# Patient Record
Sex: Female | Born: 1985 | Race: Black or African American | Hispanic: No | Smoking: Never smoker
Health system: Southern US, Community
[De-identification: ages and names within clinical notes are randomized; demographics above are authoritative.]

## PROBLEM LIST (undated history)

## (undated) ENCOUNTER — Inpatient Hospital Stay (HOSPITAL_COMMUNITY): Payer: Self-pay

## (undated) DIAGNOSIS — F419 Anxiety disorder, unspecified: Secondary | ICD-10-CM

## (undated) DIAGNOSIS — F32A Depression, unspecified: Secondary | ICD-10-CM

## (undated) DIAGNOSIS — N760 Acute vaginitis: Secondary | ICD-10-CM

## (undated) DIAGNOSIS — R110 Nausea: Secondary | ICD-10-CM

## (undated) DIAGNOSIS — D649 Anemia, unspecified: Secondary | ICD-10-CM

## (undated) DIAGNOSIS — N898 Other specified noninflammatory disorders of vagina: Secondary | ICD-10-CM

## (undated) DIAGNOSIS — F329 Major depressive disorder, single episode, unspecified: Secondary | ICD-10-CM

## (undated) DIAGNOSIS — I499 Cardiac arrhythmia, unspecified: Secondary | ICD-10-CM

## (undated) DIAGNOSIS — H05119 Granuloma of unspecified orbit: Secondary | ICD-10-CM

## (undated) DIAGNOSIS — O00109 Unspecified tubal pregnancy without intrauterine pregnancy: Secondary | ICD-10-CM

## (undated) DIAGNOSIS — F909 Attention-deficit hyperactivity disorder, unspecified type: Secondary | ICD-10-CM

## (undated) DIAGNOSIS — Z98891 History of uterine scar from previous surgery: Secondary | ICD-10-CM

## (undated) DIAGNOSIS — D219 Benign neoplasm of connective and other soft tissue, unspecified: Secondary | ICD-10-CM

## (undated) DIAGNOSIS — Z9289 Personal history of other medical treatment: Secondary | ICD-10-CM

## (undated) DIAGNOSIS — I209 Angina pectoris, unspecified: Secondary | ICD-10-CM

## (undated) HISTORY — PX: BIOPSY EYE MUSCLE: PRO14

## (undated) HISTORY — DX: Other specified noninflammatory disorders of vagina: N89.8

## (undated) HISTORY — DX: Acute vaginitis: N76.0

---

## 2008-05-08 DIAGNOSIS — Z98891 History of uterine scar from previous surgery: Secondary | ICD-10-CM

## 2008-05-08 HISTORY — DX: History of uterine scar from previous surgery: Z98.891

## 2012-05-08 DIAGNOSIS — H05119 Granuloma of unspecified orbit: Secondary | ICD-10-CM

## 2012-05-08 HISTORY — DX: Granuloma of unspecified orbit: H05.119

## 2013-10-17 ENCOUNTER — Emergency Department: Payer: Self-pay | Admitting: Emergency Medicine

## 2013-10-17 LAB — BASIC METABOLIC PANEL
Anion Gap: 6 — ABNORMAL LOW (ref 7–16)
BUN: 8 mg/dL (ref 7–18)
Calcium, Total: 8.2 mg/dL — ABNORMAL LOW (ref 8.5–10.1)
Chloride: 107 mmol/L (ref 98–107)
Co2: 25 mmol/L (ref 21–32)
Creatinine: 0.88 mg/dL (ref 0.60–1.30)
EGFR (African American): >60
EGFR (Non-African Amer.): >60
Glucose: 81 mg/dL (ref 65–99)
Osmolality: 273 (ref 275–301)
Potassium: 3.1 mmol/L — ABNORMAL LOW (ref 3.5–5.1)
Sodium: 138 mmol/L (ref 136–145)

## 2013-10-17 LAB — CBC
HCT: 30.8 % — ABNORMAL LOW (ref 35.0–47.0)
HGB: 9.9 g/dL — ABNORMAL LOW (ref 12.0–16.0)
MCH: 25.7 pg — ABNORMAL LOW (ref 26.0–34.0)
MCHC: 32 g/dL (ref 32.0–36.0)
MCV: 80 fL (ref 80–100)
Platelet: 267 10*3/uL (ref 150–440)
RBC: 3.83 10*6/uL (ref 3.80–5.20)
RDW: 17.4 % — ABNORMAL HIGH (ref 11.5–14.5)
WBC: 4.3 10*3/uL (ref 3.6–11.0)

## 2013-10-17 LAB — TROPONIN I: Troponin-I: <0.02 ng/mL

## 2014-02-25 ENCOUNTER — Encounter (HOSPITAL_COMMUNITY): Payer: Self-pay | Admitting: Emergency Medicine

## 2014-02-25 ENCOUNTER — Emergency Department (HOSPITAL_COMMUNITY): Payer: Medicaid Other

## 2014-02-25 ENCOUNTER — Emergency Department (HOSPITAL_COMMUNITY)
Admission: EM | Admit: 2014-02-25 | Discharge: 2014-02-26 | Disposition: A | Discharge status: Home / Self Care | Payer: Medicaid Other | Attending: Emergency Medicine | Admitting: Emergency Medicine

## 2014-02-25 DIAGNOSIS — Y9389 Activity, other specified: Secondary | ICD-10-CM | POA: Insufficient documentation

## 2014-02-25 DIAGNOSIS — W01198A Fall on same level from slipping, tripping and stumbling with subsequent striking against other object, initial encounter: Secondary | ICD-10-CM | POA: Diagnosis not present

## 2014-02-25 DIAGNOSIS — S0990XA Unspecified injury of head, initial encounter: Secondary | ICD-10-CM | POA: Diagnosis not present

## 2014-02-25 DIAGNOSIS — Y990 Civilian activity done for income or pay: Secondary | ICD-10-CM | POA: Diagnosis not present

## 2014-02-25 DIAGNOSIS — R55 Syncope and collapse: Secondary | ICD-10-CM

## 2014-02-25 DIAGNOSIS — Z3202 Encounter for pregnancy test, result negative: Secondary | ICD-10-CM | POA: Diagnosis not present

## 2014-02-25 DIAGNOSIS — R10814 Left lower quadrant abdominal tenderness: Secondary | ICD-10-CM | POA: Diagnosis not present

## 2014-02-25 DIAGNOSIS — Y92239 Unspecified place in hospital as the place of occurrence of the external cause: Secondary | ICD-10-CM | POA: Insufficient documentation

## 2014-02-25 LAB — BASIC METABOLIC PANEL
Anion gap: 14 (ref 5–15)
BUN: 13 mg/dL (ref 6–23)
CO2: 25 mEq/L (ref 19–32)
Calcium: 8.9 mg/dL (ref 8.4–10.5)
Chloride: 101 mEq/L (ref 96–112)
Creatinine, Ser: 0.97 mg/dL (ref 0.50–1.10)
GFR calc Af Amer: >90 mL/min (ref >90)
GFR calc non Af Amer: 79 mL/min — ABNORMAL LOW (ref >90)
Glucose, Bld: 118 mg/dL — ABNORMAL HIGH (ref 70–99)
Potassium: 3.6 mEq/L — ABNORMAL LOW (ref 3.7–5.3)
Sodium: 140 mEq/L (ref 137–147)

## 2014-02-25 LAB — CBC WITH DIFFERENTIAL/PLATELET
Basophils Absolute: 0 10*3/uL (ref 0.0–0.1)
Basophils Relative: 0 % (ref 0–1)
Eosinophils Absolute: 0.3 10*3/uL (ref 0.0–0.7)
Eosinophils Relative: 4 % (ref 0–5)
HCT: 32.4 % — ABNORMAL LOW (ref 36.0–46.0)
Hemoglobin: 10.4 g/dL — ABNORMAL LOW (ref 12.0–15.0)
Lymphocytes Relative: 62 % — ABNORMAL HIGH (ref 12–46)
Lymphs Abs: 4.6 10*3/uL — ABNORMAL HIGH (ref 0.7–4.0)
MCH: 26.4 pg (ref 26.0–34.0)
MCHC: 32.1 g/dL (ref 30.0–36.0)
MCV: 82.2 fL (ref 78.0–100.0)
Monocytes Absolute: 0.6 10*3/uL (ref 0.1–1.0)
Monocytes Relative: 8 % (ref 3–12)
Neutro Abs: 1.9 10*3/uL (ref 1.7–7.7)
Neutrophils Relative %: 26 % — ABNORMAL LOW (ref 43–77)
Platelets: 327 10*3/uL (ref 150–400)
RBC: 3.94 MIL/uL (ref 3.87–5.11)
RDW: 14.9 % (ref 11.5–15.5)
WBC: 7.5 10*3/uL (ref 4.0–10.5)

## 2014-02-25 LAB — URINE MICROSCOPIC-ADD ON

## 2014-02-25 LAB — URINALYSIS, ROUTINE W REFLEX MICROSCOPIC
Bilirubin Urine: NEGATIVE
Glucose, UA: NEGATIVE mg/dL
Ketones, ur: 15 mg/dL — AB
Nitrite: NEGATIVE
Protein, ur: 100 mg/dL — AB
Specific Gravity, Urine: 1.023 (ref 1.005–1.030)
Urobilinogen, UA: 0.2 mg/dL (ref 0.0–1.0)
pH: 5.5 (ref 5.0–8.0)

## 2014-02-25 LAB — PREGNANCY, URINE: Preg Test, Ur: NEGATIVE

## 2014-02-25 MED ORDER — ACETAMINOPHEN 500 MG PO TABS
1000.0000 mg | ORAL_TABLET | Freq: Once | ORAL | Status: AC
  Administered 2014-02-25: 1000 mg via ORAL
  Filled 2014-02-25: qty 2

## 2014-02-25 MED ORDER — SODIUM CHLORIDE 0.9 % IV BOLUS (SEPSIS)
1000.0000 mL | Freq: Once | INTRAVENOUS | Status: AC
  Administered 2014-02-25: 1000 mL via INTRAVENOUS

## 2014-02-25 NOTE — ED Provider Notes (Signed)
CSN: [id number]     Arrival date & time 02/25/14  1934 History   First MD Initiated Contact with Patient 02/25/14 2004     Chief Complaint  Patient presents with  . Loss of Consciousness     (Consider location/radiation/quality/duration/timing/severity/associated sxs/prior Treatment) HPI Comments: Patient is a 28 year old female with no past medical history who presents after a syncopal episode that occurred prior to arrival. 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 She is unsure how long she was unconscious for. Patient reports hitting the back over her head when she fell. She reports headache since the fall that is throbbing and severe. Patient also describes left lower abdominal pain that started this evening which she thought may have been from her lunch. The pain is aching and moderate with radiation to her left groin. Patient reports a history of ectopic pregnancy which "feels the same as this." Patient is currently menstruating. No aggravating/alleviating factors. No other associated symptoms.    History reviewed. No pertinent past medical history. History reviewed. No pertinent past surgical history. No family history on file. History  Substance Use Topics  . Smoking status: Never Smoker   . Smokeless tobacco: Not on file  . Alcohol Use: Not on file   OB History   Grav Para Term Preterm Abortions TAB SAB Ect Mult Living                 Review of Systems  Constitutional: Negative for fever, chills and fatigue.  HENT: Negative for trouble swallowing.   Eyes: Negative for visual disturbance.  Respiratory: Negative for shortness of breath.   Cardiovascular: Negative for chest pain and palpitations.  Gastrointestinal: Positive for abdominal pain. Negative for nausea, vomiting and diarrhea.  Genitourinary: Negative for  dysuria and difficulty urinating.  Musculoskeletal: Negative for arthralgias and neck pain.  Skin: Negative for color change.  Neurological: Positive for syncope and headaches. Negative for dizziness and weakness.  Psychiatric/Behavioral: Negative for dysphoric mood.      Allergies  Review of patient's allergies indicates no known allergies.  Home Medications   Prior to Admission medications   Not on File   BP 102/57  Pulse 67  Temp(Src) 99.1 F (37.3 C) (Oral)  Resp 12  Ht 5\' 2"  (1.575 m)  Wt 144 lb (65.318 kg)  BMI 26.33 kg/m2  SpO2 95%  LMP 02/22/2014 Physical Exam  Nursing note and vitals reviewed. Constitutional: She is oriented to person, place, and time. She appears well-developed and well-nourished. No distress.  HENT:  Head: Normocephalic and atraumatic.  Eyes: Conjunctivae and EOM are normal.  Neck: Normal range of motion.  Cardiovascular: Normal rate and regular rhythm.  Exam reveals no gallop and no friction rub.   No murmur heard. Pulmonary/Chest: Effort normal and breath sounds normal. She has no wheezes. She has no rales. She exhibits no tenderness.  Abdominal: Soft. She exhibits no distension. There is tenderness. There is no rebound and no guarding.  Left lower abdominal tenderness to palpation. No other focal tenderness or peritoneal signs.   Musculoskeletal: Normal range of motion.  Neurological: She is alert and oriented to person, place, and time. Coordination normal.  Speech is goal-oriented. Moves limbs without ataxia.   Skin: Skin is warm and dry.  Psychiatric: She has a normal mood and affect. Her behavior is normal.  ED Course  Procedures (including critical care time) Labs Review Labs Reviewed  CBC WITH DIFFERENTIAL - Abnormal; Notable for the following:    Hemoglobin 10.4 (*)    HCT 32.4 (*)    Neutrophils Relative % 26 (*)    Lymphocytes Relative 62 (*)    Lymphs Abs 4.6 (*)    All other components within normal limits  BASIC  METABOLIC PANEL - Abnormal; Notable for the following:    Potassium 3.6 (*)    Glucose, Bld 118 (*)    GFR calc non Af Amer 79 (*)    All other components within normal limits  URINALYSIS, ROUTINE W REFLEX MICROSCOPIC - Abnormal; Notable for the following:    Color, Urine RED (*)    APPearance CLOUDY (*)    Hgb urine dipstick LARGE (*)    Ketones, ur 15 (*)    Protein, ur 100 (*)    Leukocytes, UA SMALL (*)    All other components within normal limits  URINE MICROSCOPIC-ADD ON - Abnormal; Notable for the following:    Squamous Epithelial / LPF MANY (*)    Bacteria, UA FEW (*)    Casts HYALINE CASTS (*)    All other components within normal limits  PREGNANCY, URINE    Imaging Review Dg Chest 2 View  02/25/2014   CLINICAL DATA:  Collie Siad a area were patient's week to see.  EXAM: CHEST  2 VIEW  COMPARISON:  10/17/2013  FINDINGS: The heart size and mediastinal contours are within normal limits. Both lungs are clear. The visualized skeletal structures are unremarkable.  IMPRESSION: No active cardiopulmonary disease.   Electronically Signed   By: Lucienne Capers M.D.   On: 02/25/2014 21:02   Ct Head Wo Contrast  02/25/2014   CLINICAL DATA:  28 year old female with acute onset of dizziness, blurred vision and headache, follow by syncopal event. Trauma from a fall during a syncopal event with injury to the back of the head.  EXAM: CT HEAD WITHOUT CONTRAST  TECHNIQUE: Contiguous axial images were obtained from the base of the skull through the vertex without intravenous contrast.  COMPARISON:  No priors.  FINDINGS: No acute displaced skull fractures are identified. No acute intracranial abnormality. Specifically, no evidence of acute post-traumatic intracranial hemorrhage, no definite regions of acute/subacute cerebral ischemia, no focal mass, mass effect, hydrocephalus or abnormal intra or extra-axial fluid collections. The visualized paranasal sinuses and mastoids are generally well pneumatized,  with exception of a small amount of mucosal thickening in the ethmoid sinuses.  IMPRESSION: 1. No acute displaced skull fractures or acute intracranial abnormalities. 2. The appearance of the brain is normal.   Electronically Signed   By: Vinnie Langton M.D.   On: 02/25/2014 21:18     EKG Interpretation   Date/Time:  Wednesday February 25 2014 19:39:49 EDT Ventricular Rate:  69 PR Interval:  125 QRS Duration: 72 QT Interval:  369 QTC Calculation: 395 R Axis:   68 Text Interpretation:  Sinus rhythm Normal ECG No previous tracing  Confirmed by Maryan Rued  MD, Loree Fee ([id number]) on 02/25/2014 7:54:23 PM      MDM   Final diagnoses:  Syncope  Syncope and collapse    8:17 PM Labs, CT head and chest xray pending. Vitals stable and patient afebrile.   11:10 PM Patient's orthostatic vital signs indicate hypovolemia. Patient reports improved symptoms after IV fluids. Vitals stable and patient afebrile. Chest xray, CT head and labs unremarkable. Urine pregnancy negative. Patient has hemoglobin in urine  due to menstruation. Patient will be discharged after second liter of fluids.    Alvina Chou, PA-C 02/26/14 0000

## 2014-02-25 NOTE — ED Notes (Signed)
PT on monitor and given bedside commode per RN. PT informed to use call bell if assistance is needed and to not walk around the room.

## 2014-02-25 NOTE — ED Notes (Signed)
Patient orthostatic lying was  99/54 PA notified and gave verbal for bolus recheck orthostatic after bolus

## 2014-02-25 NOTE — ED Notes (Signed)
Patient transported to X-ray 

## 2014-02-25 NOTE — ED Notes (Signed)
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 Patient states she went to the rest room to have a BM and walk out the restroom went to the desk. When she stood up from the desk she fell backwards and LOC. Patient states current left side groin area and back of the head is hurting 8/10.

## 2014-02-26 NOTE — Discharge Instructions (Signed)
Drink plenty of fluids to stay hydrated. Refer to attached documents for more information. Return to the ED with worsening or concerning symptoms.

## 2014-02-26 NOTE — ED Provider Notes (Signed)
Medical screening examination/treatment/procedure(s) were performed by non-physician practitioner and as supervising physician I was immediately available for consultation/collaboration.   EKG Interpretation   Date/Time:  Wednesday February 25 2014 19:39:49 EDT Ventricular Rate:  69 PR Interval:  125 QRS Duration: 72 QT Interval:  369 QTC Calculation: 395 R Axis:   68 Text Interpretation:  Sinus rhythm Normal ECG No previous tracing  Confirmed by Maryan Rued  MD, Courtland Coppa ([id number]) on 02/25/2014 7:54:23 PM        Blanchie Dessert, MD 02/26/14 [id number]

## 2014-03-24 ENCOUNTER — Emergency Department: Payer: Self-pay | Admitting: Emergency Medicine

## 2014-04-03 ENCOUNTER — Ambulatory Visit: Payer: Self-pay | Admitting: Ophthalmology

## 2014-04-22 ENCOUNTER — Encounter (HOSPITAL_COMMUNITY): Payer: Self-pay | Admitting: Family Medicine

## 2014-04-22 ENCOUNTER — Emergency Department (HOSPITAL_COMMUNITY)
Admission: EM | Admit: 2014-04-22 | Discharge: 2014-04-22 | Disposition: A | Discharge status: Home / Self Care | Payer: Medicaid Other | Attending: Emergency Medicine | Admitting: Emergency Medicine

## 2014-04-22 DIAGNOSIS — H6983 Other specified disorders of Eustachian tube, bilateral: Secondary | ICD-10-CM

## 2014-04-22 DIAGNOSIS — H9203 Otalgia, bilateral: Secondary | ICD-10-CM | POA: Diagnosis present

## 2014-04-22 DIAGNOSIS — H6993 Unspecified Eustachian tube disorder, bilateral: Secondary | ICD-10-CM | POA: Insufficient documentation

## 2014-04-22 MED ORDER — FLUTICASONE PROPIONATE 50 MCG/ACT NA SUSP: 2.0000 | Freq: Every day | NASAL | Status: DC

## 2014-04-22 MED ORDER — IBUPROFEN 400 MG PO TABS
800.0000 mg | ORAL_TABLET | Freq: Once | ORAL | Status: AC
  Administered 2014-04-22: 800 mg via ORAL
  Filled 2014-04-22: qty 2

## 2014-04-22 NOTE — ED Provider Notes (Signed)
CSN: [id number]     Arrival date & time 04/22/14  1819 History  This chart was scribed for non-physician practitioner, Monico Blitz, PA-C, working with Quintella Reichert, MD, by Delphia Grates, ED Scribe. This patient was seen in room TR06C/TR06C and the patient's care was started at 7:24 PM.   Chief Complaint  Patient presents with  . Otalgia    The history is provided by the patient. No language interpreter was used.   HPI Comments: Caitlyn Roy is a 28 y.o. female, with no significant medical history, who presents to the Emergency Department complaining of sudden right-sided otalgia which she describes as pressure onset this morning she also has left-sided otalgia onset this afternoon. Denies decrease in hearing acuity, tinnitus. Patient suspects she has an ear infection, and reports she has recently gotten over a cold. She notes productive cough of "thick" sputum. Patient states she has taken Tylenol without significant relief. Patient is not established with a PCP. Reports subjective fever. Denies chills, chest pain, shortness of breath, nausea, vomiting, headache, sinus pain, cervicalgia, rash, change in bowel or bladder habits.   History reviewed. No pertinent past medical history. History reviewed. No pertinent past surgical history. History reviewed. No pertinent family history. History  Substance Use Topics  . Smoking status: Never Smoker   . Smokeless tobacco: Not on file  . Alcohol Use: Not on file   OB History    No data available     Review of Systems  A complete 10 system review of systems was obtained and all systems are negative except as noted in the HPI and PMH.    Allergies  Review of patient's allergies indicates no known allergies.  Home Medications   Prior to Admission medications   Not on File   Triage Vitals: BP 113/69 mmHg  Pulse 90  Temp(Src) 98.3 F (36.8 C) (Oral)  Resp 18  SpO2 100%  LMP 04/08/2014  Physical Exam  Constitutional:  She is oriented to person, place, and time. She appears well-developed and well-nourished. No distress.  HENT:  Head: Normocephalic and atraumatic.  Right Ear: External ear normal.  Left Ear: External ear normal.  Mouth/Throat: Oropharynx is clear and moist.  Trace erythema to bilateral tympanic membranes with normal architecture and good light reflex.  Eyes: Conjunctivae and EOM are normal. Pupils are equal, round, and reactive to light.  Neck: Normal range of motion. Neck supple. No tracheal deviation present.  Cardiovascular: Normal rate and intact distal pulses.   Pulmonary/Chest: Effort normal and breath sounds normal. No respiratory distress. She has no wheezes. She has no rales. She exhibits no tenderness.  Abdominal: Soft. Bowel sounds are normal. She exhibits no distension and no mass. There is no tenderness. There is no rebound and no guarding.  Musculoskeletal: Normal range of motion.  Neurological: She is alert and oriented to person, place, and time.  Skin: Skin is warm and dry.  Psychiatric: She has a normal mood and affect. Her behavior is normal.  Nursing note and vitals reviewed.   ED Course  Procedures (including critical care time)  DIAGNOSTIC STUDIES: Oxygen Saturation is 100% on room air, normal by my interpretation.    COORDINATION OF CARE: At 1927 Discussed treatment plan with patient which includes Motrin for pain control. Patient is not in agreement stating that she knows she has an ear infection and demands ABX. Informed patient that I will prescribe Flonase, however, she continues to demand ABX, and is requesting to speak with a supervisor.  At [place] spoke with patient. Patient works as Designer, multimedia at [employer], and does not want to this affect her job. Patient is requesting to a second opinion  At [place] After second opinion, patient is now in agreement with the original treatment plan of Flonase.   Labs Review Labs Reviewed - No data to  display  Imaging Review No results found.   EKG Interpretation None      MDM   Final diagnoses:  Eustachian tube dysfunction, bilateral    Filed Vitals:   04/22/14 1832 04/22/14 1957  BP: 113/69 120/76  Pulse: 90 60  Temp: 98.3 F (36.8 C)   TempSrc: Oral   Resp: 18 16  SpO2: 100% 100%    Medications  ibuprofen (ADVIL,MOTRIN) tablet 800 mg (800 mg Oral Given 04/22/14 1953)    Caitlyn Roy is a pleasant 28 y.o. female presenting with a lateral ear pain onset this morning. No signs of otitis media. It think this is likely related to eustachian tube dysfunction. I have explained to the patient that antibiotics are not indicated in this situation. I've advised the patient that using over-the-counter Flonase in addition to nasal saline will help to alleviate the pressure difference in discomfort. Symptoms consistent upon antibiotics and believes that she has an ear infection. I have advised her that antibiotics when used inappropriately will be less affected when they are needed.  Evaluation does not show pathology that would require ongoing emergent intervention or inpatient treatment. Pt is hemodynamically stable and mentating appropriately. Discussed findings and plan with patient/guardian, who agrees with care plan. All questions answered. Return precautions discussed and outpatient follow up given.        Monico Blitz, PA-C 04/23/14 [id number]  Quintella Reichert, MD 04/23/14 [phone]

## 2014-04-22 NOTE — ED Notes (Signed)
Pt sts right ear pain and now the left is hurting. sts slight fever.

## 2014-04-22 NOTE — ED Notes (Signed)
[place], PA at bedside.  

## 2014-04-22 NOTE — Discharge Instructions (Signed)
Flonase is available over-the-counter. You can pick this up at any pharmacy.  Use nasal saline (you can try Arm and Hammer Simply Saline) at least 4 times a day, use saline 5-10 minutes before using the fluticasone (flonase)   Do not use Afrin (Oxymetazoline)  Rest, wash hands frequently  and drink plenty of water.  Do not hesitate to return to the emergency room for any new, worsening or concerning symptoms.  Please obtain primary care using resource guide below. But the minute you were seen in the emergency room and that they will need to obtain records for further outpatient management.    Emergency Department Resource Guide 1) Find a Doctor and Pay Out of Pocket Although you won't have to find out who is covered by your insurance plan, it is a good idea to ask around and get recommendations. You will then need to call the office and see if the doctor you have chosen will accept you as a new patient and what types of options they offer for patients who are self-pay. Some doctors offer discounts or will set up payment plans for their patients who do not have insurance, but you will need to ask so you aren't surprised when you get to your appointment.  2) Contact Your Local Health Department Not all health departments have doctors that can see patients for sick visits, but many do, so it is worth a call to see if yours does. If you don't know where your local health department is, you can check in your phone book. The CDC also has a tool to help you locate your state's health department, and many state websites also have listings of all of their local health departments.  3) Find a [facility] If your illness is not likely to be very severe or complicated, you may want to try a walk in clinic. These are popping up all over the country in pharmacies, drugstores, and shopping centers. They're usually staffed by nurse practitioners or physician assistants that have been trained to treat common  illnesses and complaints. They're usually fairly quick and inexpensive. However, if you have serious medical issues or chronic medical problems, these are probably not your best option.  No Primary Care Doctor: - Call Health Connect at  [phone] - they can help you locate a primary care doctor that  accepts your insurance, provides certain services, etc. - Physician Referral Service- [phone]  Chronic Pain Problems: Organization         Address  Phone   Notes  [facility]  [phone] Patients need to be referred by their primary care doctor.   Medication Assistance: Organization         Address  Phone   Notes  [facility] Medication [facility] [place]., [place], [place] [address] [phone] --Must be a resident of [facility] -- Must have NO insurance coverage whatsoever (no Medicaid/ Medicare, etc.) -- The pt. MUST have a primary care doctor that directs their care regularly and follows them in the community   [facility]ist  [phone]   [employer]  [phone]    Agencies that provide inexpensive medical care: Organization         Address  Phone   Notes  [place]  [phone]   [facility]    [phone]   [facility] [place], [place] [address] [phone]  [facility] [address], [place] [phone]   Planned Parenthood    [phone]   [facility]    [phone]   [place] and [address], [place] Phone:  [phone], Fax:  [phone] Hours of Operation:  9 am - 6 pm, M-F.  Also accepts Medicaid/Medicare and self-pay.  [facility] for [place] [address], [address], [place] Phone: [phone], Fax: [phone]. Hours of Operation:  8:30 am - 5:30 pm, M-F.  Also accepts Medicaid and self-pay.  [facility]  [address], [place] Phone: [phone]   [place], [place], [place] [phone], Ext. [phone] Mondays & Thursdays: 7-9 AM.  First 15 patients are seen on a first come, first serve basis.    [place] Providers:  Organization         Address  Phone   Notes  [facility] [address], [place] [phone] Also accepts self-pay patients.  [facility] [address], [place]  [phone]   [place], [address], [place] [phone]   [facility] [address], [place] [phone]   Lucianne Lei [address], [place]   [phone] Only accepts [place] Access [place] patients after they have their name applied to their card.   Self-Pay (no insurance) in [facility]:  Organization         Address  Phone   Notes  Sickle Cell Patients, [facility] [place] [phone]   [facility] [place] [phone]   [facility] [place]  [place], [place], [place] [phone]   [facility]/Dr. Osei-Bonsu  [address], [place] or [address], [place] [phone] Phone number for both [place] and [place] locations is the same.  [facility] [address] [employer] [phone]   [facility] [address] [place] or [address] [phone]   [facility] [address], [place] [phone], phone; [phone], fax Sees patients 1st and 3rd Saturday of every month.  Must not qualify for public or private insurance (i.e. Medicaid, Medicare, [place] Health Choice, Veterans' Benefits)  Household income should be no more than 200% of the  poverty level The clinic cannot treat you if you are pregnant or think you are pregnant  Sexually transmitted diseases are not treated at the clinic.    Dental Care: Organization         Address  Phone  Notes  [facility] Department of [facility] [phone] Accepts children up to age 19 who are enrolled in [place] or [place]; pregnant women with a Medicaid card; and children who have applied for Medicaid or [place] Health Choice, but were declined, whose parents can pay a reduced fee at time of service.  [facility] Department of [facility]  [address], [place] [phone] Accepts children up to age 56 who are enrolled in [place] or [place]; pregnant women with a Medicaid card; and  children who have applied for Medicaid or [place] Health Choice, but were declined, whose parents can pay a reduced fee at time of service.  [place] Adult Dental Access PROGRAM  [place] [phone] Patients are seen by appointment only. Walk-ins are not accepted. [place] will see patients 5 years of age and older. Monday - Tuesday (8am-5pm) Most Wednesdays (8:30-5pm) $30 per visit, cash only  [facility] Adult Dental Access PROGRAM  [address], [facility] [phone] Patients are seen by appointment only. Walk-ins are not accepted. [place] will see patients 50 years of age and older. One Wednesday Evening (Monthly: Volunteer Based).  $30 per visit, cash only  [place]  [phone] for adults; Children under age 65, call Graduate Pediatric Dentistry at [phone]. Children aged 104-14, please call [phone] to request a pediatric application.  Dental services are provided in all areas of dental care including fillings, crowns and bridges, complete and partial dentures, implants, gum treatment, root canals, and extractions.  Preventive care is also provided. Treatment is provided to both adults and children. Patients are selected via a lottery and there is often a waiting list.   [facility] [address] [place]  [phone] [email or web address]   [facility] [address] [place], [place] [phone], Ext. [phone] Second and Fourth Thursday of each month, opens at 6:30 AM; Clinic ends at 9 AM.  Patients are seen on a first-come first-served basis, and a limited number are seen during each clinic.   [facility]  [address] [place], [place] [phone]   Eligibility Requirements You must have lived in [place], [place], or [place] counties for at least the last three months.   You cannot be eligible for state or federal sponsored [employer], including [employer], [place], or [employer].   You generally cannot be eligible for healthcare insurance through your employer.    How to apply: Eligibility screenings are held every Tuesday and Wednesday afternoon from 1:00 pm until 4:00 pm. You do not need an appointment for the interview!  [facility] [address], [place], [place]   [place]  [place] Department  [place]  [phone]    Behavioral Health Resources in the Community: Intensive Outpatient Programs Organization         Address  Phone  Notes  [place]a [place]. [address], [place], [place] [phone]   [facility] [address], [place], [place]   [facility]: [facility] [address], [place], [place]   [place] [address],  [place], [place] or [phone]   Substance Abuse Resources Organization         Address  Phone  Notes  Alcohol and Drug Services  [phone]   [place]  [phone]   [facility] [place]   Chinita Pester  [phone]   Residential & Outpatient Substance Abuse Program  [phone]   Psychological Services Organization         Address  Phone  Notes  [facility] [place]  [place]  [phone]   [place] [address], [place] or [phone]    Mobile Crisis Teams Organization  Address  Phone  Notes  [facility], Mobile Crisis Care Unit  [phone]   [facility]  [address] [place], [place]   [facility] [address] [place] [phone]    Self-Help/Support Groups Organization         Address  Phone             Notes  [place]. of [place] - variety of support groups  [place] Call for more information  Narcotics Anonymous (NA), [facility] [address], [employer] [place]  2 meetings at this location   Special educational needs teacher         Address  Phone  Notes  [facility]AP Residential Treatment [place],    [place]  [phone]   [facility]  [address] [place] [address], [place], [place]   [facility] [place], [place] [phone] Admissions: 8am-3pm M-F  Incentives Substance [place] [address]    [place], [place] [phone]   [facility] [address] [place], [place], [place]   [facility] [address]  [place], [place]   [facility]ight Programs - Intensive Outpatient [address] Kristeen [address], [place], [place]   [facility] [place].,  [place], [place] [phone] or [phone]   [facility] Services (RTS) [address] [place], [place] Accepts Medicaid  [facility] [place] [address]  [place]  [phone] Substance Abuse/Addiction Treatment   [facility] Organization         Address  Phone  Notes  [facility]  [phone]   Domenic Schwab, PhD [address] [place], [place]   [phone] or [phone]   [place], [place] [phone]   [facility] [address] [place], [place] [phone] Insurance/Medicaid/sponsorship through [facility] and Families [address]                                    [place], [place] [phone] [place] [address], [place] [phone]    Dr. Adele Schilder  [phone]   [facility] [place] Dept. 1) [address], [place] 2) [place] 3)  [place] [address], [place] [phone]  [phone]   [place] [phone] or [phone] (After Hours)

## 2014-08-31 ENCOUNTER — Encounter (HOSPITAL_COMMUNITY): Payer: Self-pay | Admitting: *Deleted

## 2014-08-31 ENCOUNTER — Emergency Department (HOSPITAL_COMMUNITY)
Admission: EM | Admit: 2014-08-31 | Discharge: 2014-08-31 | Disposition: A | Discharge status: Home / Self Care | Payer: Medicaid Other | Attending: Emergency Medicine | Admitting: Emergency Medicine

## 2014-08-31 DIAGNOSIS — R200 Anesthesia of skin: Secondary | ICD-10-CM | POA: Insufficient documentation

## 2014-08-31 DIAGNOSIS — R519 Headache, unspecified: Secondary | ICD-10-CM

## 2014-08-31 DIAGNOSIS — R0981 Nasal congestion: Secondary | ICD-10-CM | POA: Insufficient documentation

## 2014-08-31 DIAGNOSIS — R51 Headache: Secondary | ICD-10-CM | POA: Insufficient documentation

## 2014-08-31 DIAGNOSIS — Z85528 Personal history of other malignant neoplasm of kidney: Secondary | ICD-10-CM | POA: Diagnosis not present

## 2014-08-31 HISTORY — DX: Unspecified tubal pregnancy without intrauterine pregnancy: O00.109

## 2014-08-31 HISTORY — DX: Granuloma of unspecified orbit: H05.119

## 2014-08-31 LAB — I-STAT BETA HCG BLOOD, ED (MC, WL, AP ONLY): I-stat hCG, quantitative: <5 m[IU]/mL (ref <5)

## 2014-08-31 MED ORDER — KETOROLAC TROMETHAMINE 30 MG/ML IJ SOLN
30.0000 mg | Freq: Once | INTRAMUSCULAR | Status: AC
  Administered 2014-08-31: 30 mg via INTRAVENOUS
  Filled 2014-08-31: qty 1

## 2014-08-31 MED ORDER — SODIUM CHLORIDE 0.9 % IV BOLUS (SEPSIS)
1000.0000 mL | Freq: Once | INTRAVENOUS | Status: AC
  Administered 2014-08-31: 1000 mL via INTRAVENOUS

## 2014-08-31 MED ORDER — METOCLOPRAMIDE HCL 5 MG/ML IJ SOLN
10.0000 mg | Freq: Once | INTRAMUSCULAR | Status: AC
  Administered 2014-08-31: 10 mg via INTRAVENOUS
  Filled 2014-08-31: qty 2

## 2014-08-31 NOTE — ED Provider Notes (Signed)
CSN: [id number]     Arrival date & time 08/31/14  5885 History   First MD Initiated Contact with Patient 08/31/14 [phone]     Chief Complaint  Patient presents with  . Tingling  . Headache     Patient is a 29 y.o. female presenting with headaches. The history is provided by the patient.  Headache Location: right orbital. Quality:  Sharp Radiates to:  Does not radiate Onset quality:  Gradual Duration:  2 hours Timing:  Constant Progression:  Unchanged Chronicity:  New Relieved by:  Nothing Worsened by:  Nothing Associated symptoms: congestion, facial pain and numbness   Associated symptoms: no abdominal pain, no back pain, no blurred vision, no dizziness, no fever, no focal weakness, no hearing loss, no nausea, no neck pain, no neck stiffness, no paresthesias, no seizures, no syncope, no visual change, no vomiting and no weakness   Risk factors comment:  Denies family h/o premature CVA denies head trauma Denies tick bites/rash  She had episode of right orbital HA yesterday for 30 minutes that resolved This episode started 2 hrs ago It is right sided.  She also had pain in right upper maxillary teeth and nose.  She has mild numbness to right cheek and also feeling that her "nose is stopped up" No other weakness/numbness No h/o CVA   Past Medical History  Diagnosis Date  . Orbital pseudotumor     bil  . Tubal pregnancy    Past Surgical History  Procedure Laterality Date  . Biopsy eye muscle     No family history on file. History  Substance Use Topics  . Smoking status: Never Smoker   . Smokeless tobacco: Not on file  . Alcohol Use: Yes     Comment: occ   OB History    No data available     Review of Systems  Constitutional: Negative for fever.  HENT: Positive for congestion. Negative for hearing loss.   Eyes: Negative for blurred vision.  Respiratory: Negative for shortness of breath.   Cardiovascular: Negative for chest pain and syncope.  Gastrointestinal:  Negative for nausea, vomiting and abdominal pain.  Genitourinary: Negative for vaginal bleeding.  Musculoskeletal: Negative for back pain, neck pain and neck stiffness.  Neurological: Positive for numbness and headaches. Negative for dizziness, focal weakness, seizures, syncope, weakness and paresthesias.  All other systems reviewed and are negative.     Allergies  Review of patient's allergies indicates no known allergies.  Home Medications   Prior to Admission medications   Not on File   BP 141/91 mmHg  Pulse 73  Temp(Src) 98.4 F (36.9 C) (Oral)  Resp 13  SpO2 100%  LMP 08/18/2014 Physical Exam CONSTITUTIONAL: Well developed/well nourished HEAD: Normocephalic/atraumatic EYES: EOMI/PERRL, no nystagmus, no ptosis ENMT: Mucous membranes moist, tenderness to right upper gingiva but no dental tenderness/abscess NECK: supple no meningeal signs, no bruits SPINE/BACK:entire spine nontender CV: S1/S2 noted, no murmurs/rubs/gallops noted LUNGS: Lungs are clear to auscultation bilaterally, no apparent distress ABDOMEN: soft, nontender, no rebound or guarding GU:no cva tenderness NEURO:Awake/alert, facies symmetric, no arm or leg drift is noted Equal 5/5 strength with shoulder abduction, elbow flex/extension, wrist flex/extension in upper extremities and equal hand grips bilaterally Equal 5/5 strength with hip flexion,knee flex/extension, foot dorsi/plantar flexion Other than reported numbness to right cheek, Cranial nerves 3/4/5/6/11/13/08/11/12 tested and intact Gait normal without ataxia No past pointing Sensation to light touch intact in all extremities EXTREMITIES: pulses normal, full ROM SKIN: warm, color normal PSYCH: no  abnormalities of mood noted, alert and oriented to situation   ED Course  Procedures  9:23 AM I have low suspicion for acute CVA/SAH at this time 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   Will treat HA and reassess 10:45 AM Pt improved Well  appearing She would like to go home We discussed strict return precautions .she reports now her pain yesterday and today started after sneezing/blowing her nose   Labs Review Labs Reviewed  I-STAT BETA HCG BLOOD, ED ([facility], [facility], [facility] ONLY)   Medications  metoCLOPramide (REGLAN) injection 10 mg (10 mg Intravenous Given 08/31/14 1021)  ketorolac (TORADOL) 30 MG/ML injection 30 mg (30 mg Intravenous Given 08/31/14 1020)  sodium chloride 0.9 % bolus 1,000 mL (1,000 mLs Intravenous New Bag/Given 08/31/14 0933)    MDM   Final diagnoses:  Acute nonintractable headache, unspecified headache type    Nursing notes including past medical history and social history reviewed and considered in documentation Labs/vital reviewed myself and considered during evaluation Previous records reviewed and considered - negative CT head in 02/2014     Ripley Fraise, MD 08/31/14 1046

## 2014-08-31 NOTE — ED Notes (Signed)
Pt states numbness to R lip and R cheek and headache behind R eye .  Symptoms began at 7:23 this am and are still present.  States the same yesterday for 30 min.  No slurred speech.  Neuro intact.  PT is congested.

## 2014-08-31 NOTE — Discharge Instructions (Signed)

## 2014-09-01 ENCOUNTER — Encounter (HOSPITAL_COMMUNITY): Payer: Self-pay | Admitting: *Deleted

## 2014-09-01 ENCOUNTER — Emergency Department (HOSPITAL_COMMUNITY): Payer: Medicaid Other

## 2014-09-01 ENCOUNTER — Emergency Department (HOSPITAL_COMMUNITY)
Admission: EM | Admit: 2014-09-01 | Discharge: 2014-09-01 | Disposition: A | Discharge status: Home / Self Care | Payer: Medicaid Other | Attending: Emergency Medicine | Admitting: Emergency Medicine

## 2014-09-01 DIAGNOSIS — R200 Anesthesia of skin: Secondary | ICD-10-CM

## 2014-09-01 DIAGNOSIS — Z86018 Personal history of other benign neoplasm: Secondary | ICD-10-CM | POA: Insufficient documentation

## 2014-09-01 DIAGNOSIS — R202 Paresthesia of skin: Secondary | ICD-10-CM | POA: Insufficient documentation

## 2014-09-01 NOTE — Discharge Instructions (Signed)
Please follow directions provided. Be sure to follow-up with the neurologist if your symptoms do not improve. Please use the referral provided to establish care with a primary care doctor. She drink plenty fluids to stay well hydrated. Get adequate rest and eat a balanced diet. Don't hesitate to return for any new, worsening, or concerning symptoms.   SEEK IMMEDIATE MEDICAL CARE IF:  You feel weak.  You have trouble walking or moving.  You have problems with speech or vision.  You feel confused.  You cannot control your bladder or bowel movements.  You feel numbness after an injury.  You faint.  Your burning or prickling feeling gets worse when walking.  You have pain, cramps, or dizziness.  You develop a rash.

## 2014-09-01 NOTE — ED Provider Notes (Signed)
CSN: [id number]     Arrival date & time 09/01/14  0900 History   First MD Initiated Contact with Patient 09/01/14 [phone]     Chief Complaint  Patient presents with  . Numbness    (Consider location/radiation/quality/duration/timing/severity/associated sxs/prior Treatment) HPI Caitlyn Roy is a 29 yo female presenting with report of numbness noted in her right hand.  She states she was seen yesterday for a headache and treated in the ED and symptoms resolved.  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  After driving home, she pulled into her driveway and noticed her right hand felt weak and tingling.  She became concerned she was having a stroke and came back to the ED for evaluation.  She reports her symptoms began appr 30 min prior to arrival.  She denies any visual changes, current headache, fevers, chills, neck pain or stiffness, nausea, vomiting or difficul  Past Medical History  Diagnosis Date  . Orbital pseudotumor     bil  . Tubal pregnancy    Past Surgical History  Procedure Laterality Date  . Biopsy eye muscle     No family history on file. History  Substance Use Topics  . Smoking status: Never Smoker   . Smokeless tobacco: Not on file  . Alcohol Use: Yes     Comment: occ   OB History    No data available     Review of Systems  Constitutional: Negative for fever and chills.  HENT: Negative for sore throat.   Eyes: Negative for visual disturbance.  Respiratory: Negative for cough and shortness of breath.   Cardiovascular: Negative for chest pain and leg swelling.  Gastrointestinal: Negative for nausea, vomiting and diarrhea.  Genitourinary: Negative for dysuria.  Musculoskeletal: Negative for myalgias.  Skin: Negative for rash.  Neurological: Positive for numbness. Negative for weakness and headaches.    Allergies  Review of patient's allergies indicates no known allergies.  Home Medications   Prior to Admission medications    Not on File   BP 121/64 mmHg  Pulse 97  Temp(Src) 99 F (37.2 C) (Oral)  Resp 16  SpO2 100%  LMP 08/18/2014 Physical Exam  Constitutional: She is oriented to person, place, and time. She appears well-developed and well-nourished. No distress.  HENT:  Head: Normocephalic and atraumatic.  Eyes: Conjunctivae are normal. Pupils are equal, round, and reactive to light.  Neck: Neck supple.  Cardiovascular: Normal rate, regular rhythm and intact distal pulses.   Pulmonary/Chest: Effort normal and breath sounds normal. No respiratory distress. She has no wheezes. She has no rales. She exhibits no tenderness.  Abdominal: Soft. There is no tenderness.  Musculoskeletal: She exhibits no tenderness.  Lymphadenopathy:    She has no cervical adenopathy.  Neurological: She is alert and oriented to person, place, and time. She has normal strength. She displays no tremor. No cranial nerve deficit or sensory deficit. She exhibits normal muscle tone. Coordination normal. GCS eye subscore is 4. GCS verbal subscore is 5. GCS motor subscore is 6.  Alert, oriented, thought content appropriate. Speech fluent without evidence of aphasia. Able to follow 2 step commands without difficulty.  Cranial Nerves:  II:  Peripheral visual fields grossly normal, pupils equal, round, reactive to light III,IV, VI: ptosis not present, extra-ocular motions intact bilaterally  V,VII: smile symmetric, facial light touch sensation equal VIII: hearing grossly normal bilaterally  IX,X: gag reflex present  XI: bilateral shoulder shrug equal and strong XII: midline tongue extension  Motor:  5/5 in upper and lower extremities bilaterally including strong and equal grip strength and dorsiflexion/plantar flexion Sensory: Pinprick and light touch normal in all extremities.  Deep Tendon Reflexes: 2+ and symmetric  Cerebellar: normal finger-to-nose with bilateral upper extremities Gait: normal gait and balance CV: distal pulses  palpable throughout     Skin: Skin is warm and dry. No rash noted. She is not diaphoretic.  Psychiatric: She has a normal mood and affect.  Nursing note and vitals reviewed.   ED Course  Procedures (including critical care time) Labs Review Labs Reviewed - No data to display  Imaging Review Ct Head Wo Contrast  09/01/2014   CLINICAL DATA:  Right-sided facial numbness beginning 2 days ago. Right arm numbness today.  EXAM: CT HEAD WITHOUT CONTRAST  TECHNIQUE: Contiguous axial images were obtained from the base of the skull through the vertex without intravenous contrast.  COMPARISON:  None.  FINDINGS: No acute infarct, hemorrhage, or mass lesion is present. Insert pass ventricles insert pass fluid  Mild mucosal thickening is present in the right maxillary sinus and left medial left sphenoid sinus. There is scattered mucosal thickening throughout the ethmoid air cells and inferior left lobe sinus. The mastoid air cells are clear. The calvarium is intact.  IMPRESSION: 1. Normal CT appearance of the brain. 2. Mild paranasal sinus disease, predominantly within the ethmoid air cells.   Electronically Signed   By: San Morelle M.D.   On: 09/01/2014 11:08     EKG Interpretation None      MDM   Final diagnoses:  Numbness and tingling in right hand   29 yo with new onset right hand tingling that radiates up her arm this am.  She was evaluated and treated and released for headache yesterday and was feeling well during her shift at work last night. Her neuro exam is normal and she has normal 2 point discrimination and 5/5 strength. In the affected hand.  Her tingling is glove like. Discussed case with Dr. Rogene Houston.  Head CT is normal for acute abnormality. Pt is well-appearing, in no acute distress and vital signs reviewed and not concerning. She appears safe to be discharged.  Discharge include follow-up with neurology and resources to establish care with a PCP.  Return precautions provided.  Pt aware of plan and in agreement.     Filed Vitals:   09/01/14 1013 09/01/14 1100 09/01/14 1115 09/01/14 1130  BP: 115/70 117/64  113/76  Pulse: 80  57 70  Temp:      TempSrc:      Resp: 17 19 20 16   SpO2: 100%  95% 100%   Meds given in ED:  Medications - No data to display  There are no discharge medications for this patient.      Britt Bottom, NP 09/02/14 [place], MD 09/03/14 1729

## 2014-09-01 NOTE — ED Notes (Signed)
Pt comes from home via POV c/o right arm numbness and tingling.  Pt states she was here yesterday and was told she had a sinus infection associated with head congestion and right face numbness.  Pt a x 4, NAD.

## 2014-09-01 NOTE — ED Notes (Signed)
Patient transported to CT 

## 2014-09-08 ENCOUNTER — Ambulatory Visit (INDEPENDENT_AMBULATORY_CARE_PROVIDER_SITE_OTHER): Payer: Medicaid Other | Admitting: Diagnostic Neuroimaging

## 2014-09-08 ENCOUNTER — Encounter: Payer: Self-pay | Admitting: Diagnostic Neuroimaging

## 2014-09-08 VITALS — BP 128/47 | HR 94 | Ht 62.0 in | Wt 160.8 lb

## 2014-09-08 DIAGNOSIS — R200 Anesthesia of skin: Secondary | ICD-10-CM

## 2014-09-08 DIAGNOSIS — R202 Paresthesia of skin: Principal | ICD-10-CM

## 2014-09-08 NOTE — Progress Notes (Signed)
[facility]  PATIENT: Caitlyn Roy DOB: [birth year]  REFERRING CLINICIAN: ER referral HISTORY FROM: patient  REASON FOR VISIT: new consult    HISTORICAL  CHIEF COMPLAINT:  Chief Complaint  Patient presents with  . 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  March 2016 patient had 5 minute episode of right lip numbness. Symptoms resolved on their own.  08/31/14 patient had 30 minute episode of right lip, cheek, face, scalp numbness. Patient went to the emergency room, was evaluated and discharged home.  09/01/14 patient had a 4 hour episode of right lip, face, arm and hand numbness. Patient had sharp pains on the right side of her head following this event. Patient was evaluated emergency room with CT scanning of the head and then discharged home. Since that time patient had no further events.   PRIOR EVENTS:  - October 2015 patient had syncopal event while at work, after she had gone to the bathroom. Patient collapsed and her vital signs at that time showed hypotension.  - In 2014 patient had onset of scleral injection, swelling in eyes, blurred vision, diagnosed with orbital pseudotumor (biopsy proven), initially treated with steroids, but was unable to be weaned. She was then treated with radiation therapy to the orbits and since that time her symptoms have resolved.   REVIEW OF SYSTEMS: Full 14 system review of systems performed and notable only for fatigue anemia insomnia sleepiness.  ALLERGIES: No Known Allergies  HOME MEDICATIONS: No outpatient prescriptions prior to visit.   No facility-administered medications prior to visit.    PAST MEDICAL HISTORY: Past Medical History  Diagnosis Date  . Tubal pregnancy   . Orbital  pseudotumor 2014    s/p steroids and radiation therapy; [facility]    PAST SURGICAL HISTORY: Past Surgical History  Procedure Laterality Date  . Biopsy eye muscle      FAMILY HISTORY: Family History  Problem Relation Age of Onset  . Hypertension Mother   . Breast cancer Mother     SOCIAL HISTORY:  History   Social History  . Marital Status: Single    Spouse Name: N/A  . Number of Children: 1  . Years of Education: college    Occupational History  . Not on file.   Social History Main Topics  . Smoking status: Never Smoker   . Smokeless tobacco: Not on file  . Alcohol Use: Yes     Comment: occ  . Drug Use: No  . Sexual Activity: Not on file   Other Topics Concern  . Not on file   Social History Narrative   Lives at home with son   Drinks no caffeine      PHYSICAL EXAM  Filed Vitals:   09/08/14 0823  BP: 128/47  Pulse: 94  Height: 5\' 2"  (1.575 m)  Weight: 160 lb 12.8 oz (72.938 kg)    Body mass index is 29.4 kg/(m^2).   Visual Acuity Screening   Right eye Left eye Both eyes  Without correction: 20/20 20/20   With correction:       No flowsheet data found.  GENERAL EXAM: Patient is in no distress; well developed, nourished and groomed; neck is supple  CARDIOVASCULAR: Regular rate and rhythm, no murmurs, no carotid bruits  NEUROLOGIC: MENTAL  STATUS: awake, alert, oriented to person, place and time, recent and remote memory intact, normal attention and concentration, language fluent, comprehension intact, naming intact, fund of knowledge appropriate CRANIAL NERVE: no papilledema on fundoscopic exam, pupils equal and reactive to light, visual fields full to confrontation, extraocular muscles intact, no nystagmus, facial sensation and strength symmetric, hearing intact, palate elevates symmetrically, uvula midline, shoulder shrug symmetric, tongue midline. MOTOR: normal bulk and tone, full strength in the BUE, BLE SENSORY:  normal and symmetric to light touch, temperature, vibration  COORDINATION: finger-nose-finger, fine finger movements normal REFLEXES: deep tendon reflexes present and symmetric GAIT/STATION: narrow based gait; able to walk on toes, heels and tandem; romberg is negative    DIAGNOSTIC DATA (LABS, IMAGING, TESTING) - I reviewed patient records, labs, notes, testing and imaging myself where available.  Lab Results  Component Value Date   WBC 7.5 02/25/2014   HGB 10.4* 02/25/2014   HCT 32.4* 02/25/2014   MCV 82.2 02/25/2014   PLT 327 02/25/2014      Component Value Date/Time   NA 140 02/25/2014 2010   NA 138 10/17/2013 0442   K 3.6* 02/25/2014 2010   K 3.1* 10/17/2013 0442   CL 101 02/25/2014 2010   CL 107 10/17/2013 0442   CO2 25 02/25/2014 2010   CO2 25 10/17/2013 0442   GLUCOSE 118* 02/25/2014 2010   GLUCOSE 81 10/17/2013 0442   BUN 13 02/25/2014 2010   BUN 8 10/17/2013 0442   CREATININE 0.97 02/25/2014 2010   CREATININE 0.88 10/17/2013 0442   CALCIUM 8.9 02/25/2014 2010   CALCIUM 8.2* 10/17/2013 0442   GFRNONAA 79* 02/25/2014 2010   GFRNONAA >60 10/17/2013 0442   GFRAA >90 02/25/2014 2010   GFRAA >60 10/17/2013 0442   No results found for: CHOL, HDL, LDLCALC, LDLDIRECT, TRIG, CHOLHDL No results found for: HGBA1C No results found for: VITAMINB12 No results found for: TSH   02/25/14 CT head 1. No acute displaced skull fractures or acute intracranial abnormalities. 2. The appearance of the brain is normal.  4/26/16CT head 1. Normal CT appearance of the brain. 2. Mild paranasal sinus disease, predominantly within the ethmoid air cells. [I reviewed images myself and agree with interpretation. -VRP]     ASSESSMENT AND PLAN  29 y.o. year old female here with transient right face and arm numbness since March 2016. Episode concerning for partial onset sensory seizures. Vascular event felt to be less likely given patient's age and lack of risk factors. Her prior  orbital pseudotumor diagnosis would raise concern for recurrent autoimmune/inflammatory process.  Localization: left brain cortical (parietal)  Ddx: partial seizure, TIA, complicated migraine  PLAN: - workup (MRI, EEG) - request records from [facility] from 2014 (orbital pseudotumor dx and tx)  Orders Placed This Encounter  Procedures  . MR Brain W Wo Contrast  . EEG adult   Return in about 1 month (around 10/09/2014).    Penni Bombard, MD 06/14/8339, 9:62 AM Certified in Neurology, Neurophysiology and Neuroimaging  [facility] [address], [place], [place] [address] [phone]

## 2014-09-08 NOTE — Patient Instructions (Signed)
I will check MRI brain and EEG.

## 2014-09-09 ENCOUNTER — Encounter (INDEPENDENT_AMBULATORY_CARE_PROVIDER_SITE_OTHER): Payer: Medicaid Other | Admitting: Neurology

## 2014-09-09 ENCOUNTER — Ambulatory Visit (INDEPENDENT_AMBULATORY_CARE_PROVIDER_SITE_OTHER): Payer: Medicaid Other | Admitting: Neurology

## 2014-09-09 DIAGNOSIS — R200 Anesthesia of skin: Secondary | ICD-10-CM

## 2014-09-09 DIAGNOSIS — R202 Paresthesia of skin: Principal | ICD-10-CM

## 2014-09-09 NOTE — Procedures (Signed)
    History:  Caitlyn Roy is a 29 year old patient with a history of episodes of right-sided numbness that have occurred over the last month prior to this evaluation. The episodes may last 5 minutes of 2-4 hours. The patient is being evaluated for these events.  This is a routine EEG. No skull defects are noted. Medications include Benadryl and iron supplementation.   EEG classification: Normal awake and asleep  Description of the recording: The background rhythms of this recording consists of a fairly well modulated medium amplitude background activity of 10 Hz. As the record progresses, the patient initially is in the waking state, but appears to enter the early stage II sleep during the recording, with rudimentary sleep spindles and vertex sharp wave activity seen. During the wakeful state, photic stimulation is performed, and this results in a bilateral and symmetric photic driving response. Hyperventilation was also performed, and this results in a minimal buildup of the background rhythm activities without significant slowing seen. At no time during the recording does there appear to be evidence of spike or spike wave discharges or evidence of focal slowing. EKG monitor shows no evidence of cardiac rhythm abnormalities with a heart rate of 84.  Impression: This is a normal EEG recording in the waking and sleeping state. No evidence of ictal or interictal discharges were seen at any time during the recording.

## 2014-09-10 ENCOUNTER — Telehealth: Payer: Self-pay

## 2014-09-10 NOTE — Telephone Encounter (Signed)
I called the patient and let her know that her EEG was normal.

## 2014-09-11 ENCOUNTER — Other Ambulatory Visit: Payer: Self-pay | Admitting: Neurology

## 2014-09-11 DIAGNOSIS — R200 Anesthesia of skin: Secondary | ICD-10-CM

## 2014-09-11 DIAGNOSIS — R202 Paresthesia of skin: Principal | ICD-10-CM

## 2014-09-22 ENCOUNTER — Telehealth: Payer: Self-pay

## 2014-09-22 NOTE — Telephone Encounter (Signed)
Pt was informed of normal MRI results.

## 2014-10-13 ENCOUNTER — Encounter: Payer: Self-pay | Admitting: Diagnostic Neuroimaging

## 2014-10-13 ENCOUNTER — Ambulatory Visit (INDEPENDENT_AMBULATORY_CARE_PROVIDER_SITE_OTHER): Payer: Medicaid Other | Admitting: Diagnostic Neuroimaging

## 2014-10-13 VITALS — BP 112/68 | HR 76 | Ht 62.0 in | Wt 157.0 lb

## 2014-10-13 DIAGNOSIS — R200 Anesthesia of skin: Secondary | ICD-10-CM

## 2014-10-13 DIAGNOSIS — R202 Paresthesia of skin: Principal | ICD-10-CM

## 2014-10-13 DIAGNOSIS — M25571 Pain in right ankle and joints of right foot: Secondary | ICD-10-CM

## 2014-10-13 NOTE — Progress Notes (Signed)
[facility]  PATIENT: Caitlyn Roy DOB: June 22, 1985  REFERRING CLINICIAN: ER referral HISTORY FROM: patient  REASON FOR VISIT: follow up    HISTORICAL  CHIEF COMPLAINT:  Chief Complaint  Patient presents with  . Follow-up    numbness and tingling of the right side of the face    HISTORY OF PRESENT ILLNESS:   UPDATE 10/13/14: Since last visit, had 1 more episode of right hand numbness. Also has 5 days of right foot/ankle pain (no injury). Still not established with PCP. MRI and EEG were normal.  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 March 2016 patient had 5 minute episode of right lip numbness. Symptoms resolved on their own. 08/31/14 patient had 30 minute episode of right lip, cheek, face, scalp numbness. Patient went to the emergency room, was evaluated and discharged home. 09/01/14 patient had a 4 hour episode of right lip, face, arm and hand numbness. Patient had sharp pains on the right side of her head following this event. Patient was evaluated emergency room with CT scanning of the head and then discharged home. Since that time patient had no further events.  PRIOR EVENTS: - October 2015 patient had syncopal event while at work, after she had gone to the bathroom. Patient collapsed and her vital signs at that time showed hypotension. - In 2014 patient had onset of scleral injection, swelling in eyes, blurred vision, diagnosed with orbital pseudotumor (biopsy proven), initially treated with steroids, but was unable to be weaned. She was then treated with radiation therapy to the orbits and since that time her symptoms have resolved.   REVIEW OF SYSTEMS: Full 14 system review of systems performed and notable only for numbness weakness joint pain aching muscles.   ALLERGIES: No Known  Allergies  HOME MEDICATIONS: Outpatient Prescriptions Prior to Visit  Medication Sig Dispense Refill  . ferrous sulfate 325 (65 FE) MG tablet Take 325 mg by mouth daily with breakfast.    . Boric Acid POWD by Does not apply route. Pt takes differently; places in vagina to decrease yeast infections    . diphenhydrAMINE (BENADRYL) 25 MG tablet Take 25 mg by mouth every 6 (six) hours as needed.     No facility-administered medications prior to visit.    PAST MEDICAL HISTORY: Past Medical History  Diagnosis Date  . Tubal pregnancy   . Orbital pseudotumor 2014    s/p steroids and radiation therapy; [facility]    PAST SURGICAL HISTORY: Past Surgical History  Procedure Laterality Date  . Biopsy eye muscle      FAMILY HISTORY: Family History  Problem Relation Age of Onset  . Hypertension Mother   . Breast cancer Mother     SOCIAL HISTORY:  History   Social History  . Marital Status: Single    Spouse Name: N/A  . Number of Children: 1  . Years of Education: college    Occupational History  . Not on file.   Social History Main Topics  . Smoking status: Never Smoker   . Smokeless tobacco: Not on file  . Alcohol Use: Yes     Comment: occ  . Drug Use: No  . Sexual Activity: Not on file   Other Topics Concern  . Not on file   Social History Narrative   Lives at home with son   Drinks no caffeine  PHYSICAL EXAM  Filed Vitals:   10/13/14 1026  BP: 112/68  Pulse: 76  Height: 5\' 2"  (1.575 m)  Weight: 157 lb (71.215 kg)    Body mass index is 28.71 kg/(m^2).  No exam data present  No flowsheet data found.  GENERAL EXAM: Patient is in no distress; well developed, nourished and groomed; neck is supple  CARDIOVASCULAR: Regular rate and rhythm, no murmurs, no carotid bruits  NEUROLOGIC: MENTAL STATUS: awake, alert, language fluent, comprehension intact, naming intact, fund of knowledge appropriate CRANIAL NERVE: pupils equal  and reactive to light, visual fields full to confrontation, extraocular muscles intact, no nystagmus, facial sensation and strength symmetric, hearing intact, palate elevates symmetrically, uvula midline, shoulder shrug symmetric, tongue midline. MOTOR: normal bulk and tone, full strength in the BUE, BLE SENSORY: normal and symmetric to light touch COORDINATION: finger-nose-finger, fine finger movements normal REFLEXES: deep tendon reflexes present and symmetric GAIT/STATION: narrow based gait    DIAGNOSTIC DATA (LABS, IMAGING, TESTING) - I reviewed patient records, labs, notes, testing and imaging myself where available.  Lab Results  Component Value Date   WBC 7.5 02/25/2014   HGB 10.4* 02/25/2014   HCT 32.4* 02/25/2014   MCV 82.2 02/25/2014   PLT 327 02/25/2014      Component Value Date/Time   NA 140 02/25/2014 2010   NA 138 10/17/2013 0442   K 3.6* 02/25/2014 2010   K 3.1* 10/17/2013 0442   CL 101 02/25/2014 2010   CL 107 10/17/2013 0442   CO2 25 02/25/2014 2010   CO2 25 10/17/2013 0442   GLUCOSE 118* 02/25/2014 2010   GLUCOSE 81 10/17/2013 0442   BUN 13 02/25/2014 2010   BUN 8 10/17/2013 0442   CREATININE 0.97 02/25/2014 2010   CREATININE 0.88 10/17/2013 0442   CALCIUM 8.9 02/25/2014 2010   CALCIUM 8.2* 10/17/2013 0442   GFRNONAA 79* 02/25/2014 2010   GFRNONAA >60 10/17/2013 0442   GFRAA >90 02/25/2014 2010   GFRAA >60 10/17/2013 0442   No results found for: CHOL, HDL, LDLCALC, LDLDIRECT, TRIG, CHOLHDL No results found for: HGBA1C No results found for: VITAMINB12 No results found for: TSH   02/25/14 CT head 1. No acute displaced skull fractures or acute intracranial abnormalities. 2. The appearance of the brain is normal.  09/01/14 CT head 1. Normal CT appearance of the brain. 2. Mild paranasal sinus disease, predominantly within the ethmoid air cells.  09/09/14 MRI brain - normal; the study was compared to an MRI of the orbits from 04/03/2014. There is been  no interval change.  09/09/14 EEG - normal    ASSESSMENT AND PLAN  29 y.o. year old female here with transient right face and arm numbness since March 2016. Episode concerning for partial onset sensory seizures vs atypical migraine. Also, patient requesting extraction of foreign body from right hand (pencil tip in right palm from 3rd grade).  Localization: left brain cortical (parietal)  Ddx: partial seizure vs complicated migraine vs cervical radiculopathy vs intermittent compressive neuropathy  PLAN: - hand surg referral for foreign body extraction - monitor right hand / face symptoms - establish with PCP  Orders Placed This Encounter  Procedures  . Ambulatory referral to Hand Surgery   Return in about 6 months (around 04/14/2015).    Penni Bombard, MD 10/14/4852, 62:70 AM Certified in Neurology, Neurophysiology and Neuroimaging  [facility] [address] [place], [place] [address] [phone]

## 2014-10-13 NOTE — Patient Instructions (Signed)
Establish with PCP.   Monitor symptoms.

## 2014-10-15 ENCOUNTER — Ambulatory Visit (INDEPENDENT_AMBULATORY_CARE_PROVIDER_SITE_OTHER): Payer: Self-pay | Admitting: Obstetrics and Gynecology

## 2014-10-15 ENCOUNTER — Encounter: Payer: Self-pay | Admitting: Obstetrics and Gynecology

## 2014-10-15 ENCOUNTER — Telehealth: Payer: Self-pay | Admitting: Diagnostic Neuroimaging

## 2014-10-15 ENCOUNTER — Encounter: Payer: Self-pay | Admitting: *Deleted

## 2014-10-15 VITALS — BP 120/71 | HR 80 | Ht 62.0 in | Wt 157.1 lb

## 2014-10-15 DIAGNOSIS — N760 Acute vaginitis: Secondary | ICD-10-CM

## 2014-10-15 DIAGNOSIS — A499 Bacterial infection, unspecified: Secondary | ICD-10-CM

## 2014-10-15 DIAGNOSIS — B9689 Other specified bacterial agents as the cause of diseases classified elsewhere: Secondary | ICD-10-CM

## 2014-10-15 MED ORDER — BORIC ACID CRYS: 600.0000 mg | CRYSTALS | Status: DC

## 2014-10-15 MED ORDER — CLINDAMYCIN PHOSPHATE (1 DOSE) 2 % VA CREA: 1.0000 | TOPICAL_CREAM | Freq: Two times a day (BID) | VAGINAL | Status: DC

## 2014-10-15 NOTE — Telephone Encounter (Signed)
Called and spoke with patient because I sent the referral to [facility] per Dr. Leta Baptist and patient has [employer] so the referral has to come from her PCP. I called the PCP office to have them take care of it and they state they have never seen patient and are not able to do it. I informed the patient and she verbalized understanding and states she will be back in touch once she gets everything straightened out.

## 2014-10-15 NOTE — Progress Notes (Signed)
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   Subjective: Reports recurrent fishy odor and white discharge  Objective: Vaginal vault without lesions Copious white discharge Wetprep: Clue Uncontrolled Symptoms   Trich Screened but did not have at least 2 criteria for intervention WBC NA yeast Screened but did not have at least 2 criteria for interventionRBC negWhiff Uncontrolled Symptoms  NuSwab not obtained   Prescribed Clindesse, one time treatment, with 5 refills.    Patient was also offered to continue the alternative regimen with vaginal boric acid 600 mg nightly for three weeks, then twice weekly for 6 months.   Counseled to avoid douching; prolong tampon use; limit sexual partners; and notify our office if symptoms persist or worsen.  Krisann Mckenna Valene Bors, CNM [facility]

## 2014-10-28 ENCOUNTER — Telehealth: Payer: Self-pay | Admitting: Obstetrics and Gynecology

## 2014-10-28 NOTE — Telephone Encounter (Signed)
PT CALLED AND SHE HAS ANOTHER YEAST INFECTION AND WANTED TO KNOW IF YOU COULD CALL IN RX, PT ALSO WANTED ME TO LET YOU KNOW THAT SHE FOUND THE COUPON THAT YOU GAVE HER FOR THE BV AND SHE IS GOING TO GET THAT FILLED, MEDICAID WANT PAY FOR THE BV UNLESS ITS PRIOR AUTH BUT SHE FOUND THE COUPON AND IS GETTING THAT, PT STATED JUST NEEDS A RX FOR A YEAST INFECTION.

## 2014-10-29 ENCOUNTER — Other Ambulatory Visit: Payer: Self-pay | Admitting: Obstetrics and Gynecology

## 2014-10-29 MED ORDER — FLUCONAZOLE 150 MG PO TABS: 150.0000 mg | ORAL_TABLET | Freq: Once | ORAL | Status: DC

## 2014-10-29 NOTE — Telephone Encounter (Signed)
Please let her know I sent in rx for yeast pill, and we are working on prior auth for [employer]- but most likely they will not cover it even then, to use coupon.

## 2014-10-29 NOTE — Telephone Encounter (Signed)
Notified pt. 

## 2014-12-09 ENCOUNTER — Other Ambulatory Visit: Payer: Self-pay | Admitting: Obstetrics and Gynecology

## 2014-12-09 ENCOUNTER — Other Ambulatory Visit: Payer: Self-pay | Admitting: *Deleted

## 2014-12-09 ENCOUNTER — Encounter: Payer: Self-pay | Admitting: Obstetrics and Gynecology

## 2014-12-09 ENCOUNTER — Ambulatory Visit (INDEPENDENT_AMBULATORY_CARE_PROVIDER_SITE_OTHER): Payer: 59 | Admitting: Obstetrics and Gynecology

## 2014-12-09 VITALS — BP 110/76 | HR 94 | Ht 62.0 in | Wt 164.4 lb

## 2014-12-09 DIAGNOSIS — Z8742 Personal history of other diseases of the female genital tract: Secondary | ICD-10-CM

## 2014-12-09 DIAGNOSIS — N760 Acute vaginitis: Secondary | ICD-10-CM

## 2014-12-09 DIAGNOSIS — B9689 Other specified bacterial agents as the cause of diseases classified elsewhere: Secondary | ICD-10-CM

## 2014-12-09 DIAGNOSIS — A499 Bacterial infection, unspecified: Secondary | ICD-10-CM

## 2014-12-09 MED ORDER — CLINDAMYCIN PHOSPHATE (1 DOSE) 2 % VA CREA: 1.0000 | TOPICAL_CREAM | Freq: Two times a day (BID) | VAGINAL | Status: DC

## 2014-12-09 MED ORDER — BORIC ACID CRYS: 600.0000 mg | CRYSTALS | Status: DC

## 2014-12-09 MED ORDER — FLUCONAZOLE 150 MG PO TABS: 150.0000 mg | ORAL_TABLET | Freq: Once | ORAL | Status: DC

## 2014-12-09 NOTE — Progress Notes (Signed)
Subjective:     Patient ID: Caitlyn Roy, female   DOB: 29-Dec-1985, 29 y.o.   MRN: [id number]  HPI Reports return of vaginal d/c with odor x 3 days, had intercourse 4 days prior.   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 Same female partner, but had not been together for a few months, denies any other s/s GI changes or urinary symptoms.    Objective:   Physical Exam A&O x4 Pelvic exam: VULVA: normal appearing vulva with no masses, tenderness or lesions, VAGINA: normal appearing vagina with normal color and discharge, no lesions, vaginal discharge - white, copious, malodorous and thin, DNA probe for chlamydia and GC obtained, CERVIX: normal appearing cervix without discharge or lesions, PAP: Pap smear done today, WET MOUNT done - results: KOH done, clue cells, excessive bacteria.    Assessment:     BV H/o abnormal pap     Plan:     Refill rx for clindesse and boric acid capsules, also sent in refill for diflucan as she has had recurrent yeast infections in past. To take clindesse now, and use boric acid capsules q3d prn or postcoital prn.  RTC prn  Melody Trudee Kuster, CNM

## 2014-12-11 LAB — CYTOLOGY - PAP

## 2014-12-18 ENCOUNTER — Other Ambulatory Visit: Payer: Self-pay | Admitting: Obstetrics and Gynecology

## 2014-12-18 ENCOUNTER — Other Ambulatory Visit: Payer: Self-pay | Admitting: *Deleted

## 2014-12-18 ENCOUNTER — Telehealth: Payer: Self-pay | Admitting: *Deleted

## 2014-12-18 DIAGNOSIS — A493 Mycoplasma infection, unspecified site: Secondary | ICD-10-CM

## 2014-12-18 MED ORDER — TETRACYCLINE HCL 500 MG PO CAPS: 500.0000 mg | ORAL_CAPSULE | Freq: Four times a day (QID) | ORAL | Status: DC

## 2014-12-18 NOTE — Telephone Encounter (Signed)
-----   Message from Evonnie Pat, [place] sent at 12/18/2014  9:21 AM EDT ----- Please let her know her pap was normal but swab showed BV that needs an additional medication (and I why this keeps reoccuring) It is call Urogenital Mycoplasma & Ureaplasma, and I have sent in a oral prescription for one capsule 4 xd for a week.

## 2015-01-26 ENCOUNTER — Telehealth: Payer: Self-pay | Admitting: Obstetrics and Gynecology

## 2015-01-26 ENCOUNTER — Other Ambulatory Visit: Payer: Self-pay | Admitting: Obstetrics and Gynecology

## 2015-01-26 MED ORDER — CLINDAMYCIN PHOSPHATE (1 DOSE) 2 % VA CREA: 1.0000 | TOPICAL_CREAM | Freq: Two times a day (BID) | VAGINAL | Status: DC

## 2015-01-26 NOTE — Telephone Encounter (Signed)
PT CALLED AND WANTED TO KNOW IF YOU COULD SEND IN A RX FOR BV FOR HER, SHE USES [facility] [address]

## 2015-01-26 NOTE — Telephone Encounter (Signed)
Please fax printed rx in, see if she needs discount card??

## 2015-01-26 NOTE — Telephone Encounter (Signed)
rx was sent to pharmacy, pt notified

## 2015-02-22 ENCOUNTER — Telehealth: Payer: Self-pay | Admitting: Obstetrics and Gynecology

## 2015-02-22 NOTE — Telephone Encounter (Signed)
Having symptoms, external itching , discharge, and irratation   [facility] [address]

## 2015-02-22 NOTE — Telephone Encounter (Signed)
pls advise

## 2015-02-23 ENCOUNTER — Other Ambulatory Visit: Payer: Self-pay | Admitting: Obstetrics and Gynecology

## 2015-02-23 NOTE — Telephone Encounter (Signed)
Please see if she picked up clindamycin cream I prescribed last month, and if so, when did she use it? Are symptoms the same? If so she has refills and want her to use it, otherwise see if she can come in tomorrow for me to check her (early afternoon)

## 2015-04-21 ENCOUNTER — Encounter (INDEPENDENT_AMBULATORY_CARE_PROVIDER_SITE_OTHER): Payer: Self-pay

## 2015-04-21 ENCOUNTER — Ambulatory Visit (INDEPENDENT_AMBULATORY_CARE_PROVIDER_SITE_OTHER): Payer: 59 | Admitting: Diagnostic Neuroimaging

## 2015-04-21 ENCOUNTER — Encounter: Payer: Self-pay | Admitting: Diagnostic Neuroimaging

## 2015-04-21 VITALS — BP 128/71 | HR 82 | Ht 62.0 in | Wt 165.0 lb

## 2015-04-21 DIAGNOSIS — R200 Anesthesia of skin: Secondary | ICD-10-CM

## 2015-04-21 DIAGNOSIS — R51 Headache: Secondary | ICD-10-CM | POA: Diagnosis not present

## 2015-04-21 DIAGNOSIS — R519 Headache, unspecified: Secondary | ICD-10-CM

## 2015-04-21 DIAGNOSIS — R202 Paresthesia of skin: Principal | ICD-10-CM

## 2015-04-21 NOTE — Patient Instructions (Signed)

## 2015-04-21 NOTE — Progress Notes (Signed)
[facility]  PATIENT: Verita Cavanna DOB: 1986/02/01  REFERRING CLINICIAN:  HISTORY FROM: patient  REASON FOR VISIT: follow up    HISTORICAL  CHIEF COMPLAINT:  Chief Complaint  Patient presents with  . Follow-up    Rm 6 by herself. F/u for numbness/tingling of right arm. Right ankle pain. Has only had one episode of each since last OV. Thinks "this is a waste of time and money because we cannot catch anything"     HISTORY OF PRESENT ILLNESS:   UPDATE 04/21/15: Since last visit, had 1 more event of transient right sided numbness and left sided headache. No nausea, vomit, photophobia or phonophobia.  UPDATE 10/13/14: Since last visit, had 1 more episode of right hand numbness. Also has 5 days of right foot/ankle pain (no injury). Still not established with PCP. MRI and EEG were normal.  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 March 2016 patient had 5 minute episode of right lip numbness. Symptoms resolved on their own. 08/31/14 patient had 30 minute episode of right lip, cheek, face, scalp numbness. Patient went to the emergency room, was evaluated and discharged home. 09/01/14 patient had a 4 hour episode of right lip, face, arm and hand numbness. Patient had sharp pains on the right side of her head following this event. Patient was evaluated emergency room with CT scanning of the head and then discharged home. Since that time patient had no further events.  PRIOR EVENTS: - October 2015 patient had syncopal event while at work, after she had gone to the bathroom. Patient collapsed and her vital signs at that time showed hypotension. - In 2014 patient had onset of scleral injection, swelling in eyes, blurred vision, diagnosed with orbital pseudotumor (biopsy proven), initially treated with steroids, but was  unable to be weaned. She was then treated with radiation therapy to the orbits and since that time her symptoms have resolved.   REVIEW OF SYSTEMS: Full 14 system review of systems performed and notable only for numbness weakness joint pain aching muscles.   ALLERGIES: No Known Allergies  HOME MEDICATIONS: Outpatient Prescriptions Prior to Visit  Medication Sig Dispense Refill  . ferrous sulfate 325 (65 FE) MG tablet Take 325 mg by mouth daily with breakfast.    . Boric Acid CRYS Place 600 mg vaginally 2 (two) times a week. 500 g 5  . Clindamycin Phosphate, 1 Dose, vaginal cream Apply 1 Dose topically 2 (two) times daily. 5.8 g 4  . fluconazole (DIFLUCAN) 150 MG tablet Take 1 tablet (150 mg total) by mouth once. Can take additional dose three days later if symptoms persist 1 tablet 3  . Probiotic Product (PROBIOTIC ADVANCED PO) Take by mouth.    . tetracycline (ACHROMYCIN,SUMYCIN) 500 MG capsule Take 1 capsule (500 mg total) by mouth 4 (four) times daily. 28 capsule 2   No facility-administered medications prior to visit.    PAST MEDICAL HISTORY: Past Medical History  Diagnosis Date  . Tubal pregnancy   . 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 Vaginal odor   . Vaginitis     PAST SURGICAL HISTORY: Past Surgical History  Procedure Laterality Date  . Biopsy eye muscle    . Cesarean section  2010    FAMILY HISTORY: Family History  Problem Relation Age of Onset  .  Hypertension Mother   . Breast cancer Mother     SOCIAL HISTORY:  Social History   Social History  . Marital Status: Single    Spouse Name: N/A  . Number of Children: 1  . Years of Education: college    Occupational History  . Not on file.   Social History Main Topics  . Smoking status: Never Smoker   . Smokeless tobacco: Never Used  . Alcohol Use: Yes     Comment: occ  . Drug Use: No  . Sexual Activity: Yes    Birth Control/  Protection: Condom   Other Topics Concern  . Not on file   Social History Narrative   Lives at home with son   Drinks no caffeine      PHYSICAL EXAM  Filed Vitals:   04/21/15 0801  BP: 128/71  Pulse: 82  Height: 5\' 2"  (1.575 m)  Weight: 165 lb (74.844 kg)    Body mass index is 30.17 kg/(m^2).  No exam data present  No flowsheet data found.  GENERAL EXAM: Patient is in no distress; well developed, nourished and groomed; neck is supple  CARDIOVASCULAR: Regular rate and rhythm, no murmurs, no carotid bruits  NEUROLOGIC: MENTAL STATUS: awake, alert, language fluent, comprehension intact, naming intact, fund of knowledge appropriate CRANIAL NERVE: pupils equal and reactive to light, visual fields full to confrontation, extraocular muscles intact, no nystagmus, facial sensation and strength symmetric, hearing intact, palate elevates symmetrically, uvula midline, shoulder shrug symmetric, tongue midline. MOTOR: normal bulk and tone, full strength in the BUE, BLE SENSORY: normal and symmetric to light touch COORDINATION: finger-nose-finger, fine finger movements normal REFLEXES: deep tendon reflexes present and symmetric GAIT/STATION: narrow based gait    DIAGNOSTIC DATA (LABS, IMAGING, TESTING) - I reviewed patient records, labs, notes, testing and imaging myself where available.  Lab Results  Component Value Date   WBC 7.5 02/25/2014   HGB 10.4* 02/25/2014   HCT 32.4* 02/25/2014   MCV 82.2 02/25/2014   PLT 327 02/25/2014      Component Value Date/Time   NA 140 02/25/2014 2010   NA 138 10/17/2013 0442   K 3.6* 02/25/2014 2010   K 3.1* 10/17/2013 0442   CL 101 02/25/2014 2010   CL 107 10/17/2013 0442   CO2 25 02/25/2014 2010   CO2 25 10/17/2013 0442   GLUCOSE 118* 02/25/2014 2010   GLUCOSE 81 10/17/2013 0442   BUN 13 02/25/2014 2010   BUN 8 10/17/2013 0442   CREATININE 0.97 02/25/2014 2010   CREATININE 0.88 10/17/2013 0442   CALCIUM 8.9 02/25/2014 2010     CALCIUM 8.2* 10/17/2013 0442   GFRNONAA 79* 02/25/2014 2010   GFRNONAA >60 10/17/2013 0442   GFRAA >90 02/25/2014 2010   GFRAA >60 10/17/2013 0442   No results found for: CHOL, HDL, LDLCALC, LDLDIRECT, TRIG, CHOLHDL No results found for: HGBA1C No results found for: VITAMINB12 No results found for: TSH   02/25/14 CT head 1. No acute displaced skull fractures or acute intracranial abnormalities. 2. The appearance of the brain is normal.  09/01/14 CT head 1. Normal CT appearance of the brain. 2. Mild paranasal sinus disease, predominantly within the ethmoid air cells.  09/09/14 MRI brain - normal; the study was compared to an MRI of the orbits from 04/03/2014. There is been no interval change.  09/09/14 EEG - normal    ASSESSMENT AND PLAN  29 y.o. year old female here with transient right face and arm numbness since March 2016,  sometimes associated with left sided headaches.   Localization: left brain cortical (parietal)  Dx: atypical/complicated migraine   PLAN: - monitor right hand / face symptoms  Return if symptoms worsen or fail to improve, for return to PCP.    Penni Bombard, MD [id number], [id number] AM Certified in Neurology, Neurophysiology and Neuroimaging  [facility] [address], [place], [place] [address] [phone]

## 2015-05-02 ENCOUNTER — Encounter (HOSPITAL_COMMUNITY): Payer: Self-pay | Admitting: Vascular Surgery

## 2015-05-02 ENCOUNTER — Emergency Department (HOSPITAL_COMMUNITY)
Admission: EM | Admit: 2015-05-02 | Discharge: 2015-05-02 | Disposition: A | Discharge status: Home / Self Care | Payer: 59 | Attending: Emergency Medicine | Admitting: Emergency Medicine

## 2015-05-02 DIAGNOSIS — Z79899 Other long term (current) drug therapy: Secondary | ICD-10-CM | POA: Diagnosis not present

## 2015-05-02 DIAGNOSIS — J029 Acute pharyngitis, unspecified: Secondary | ICD-10-CM | POA: Diagnosis not present

## 2015-05-02 DIAGNOSIS — Z8669 Personal history of other diseases of the nervous system and sense organs: Secondary | ICD-10-CM | POA: Insufficient documentation

## 2015-05-02 DIAGNOSIS — Z8742 Personal history of other diseases of the female genital tract: Secondary | ICD-10-CM | POA: Insufficient documentation

## 2015-05-02 LAB — RAPID STREP SCREEN (MED CTR MEBANE ONLY): Streptococcus, Group A Screen (Direct): NEGATIVE

## 2015-05-02 MED ORDER — IBUPROFEN 400 MG PO TABS
800.0000 mg | ORAL_TABLET | Freq: Once | ORAL | Status: DC
  Filled 2015-05-02: qty 2

## 2015-05-02 MED ORDER — IBUPROFEN 800 MG PO TABS: 800.0000 mg | ORAL_TABLET | Freq: Three times a day (TID) | ORAL | Status: DC

## 2015-05-02 NOTE — ED Notes (Signed)
Pt reports to the ED for eval of sore throat x several days. States it has been worsening. Also reports a dry cough x several days. Throat slightly erythematous and tonsils appear enlarged. No exudate noted. Airway intact. Pt A&Ox4, resp e/u, and skin warm and dry.

## 2015-05-02 NOTE — ED Provider Notes (Signed)
CSN: [id number]     Arrival date & time 05/02/15  1430 History  By signing my name below, I, Caitlyn Roy, attest that this documentation has been prepared under the direction and in the presence of [employer], PA-C. Electronically Signed: Julien Roy, ED Scribe. 05/02/2015. 3:15 PM.    Chief Complaint  Patient presents with  . Sore Throat      The history is provided by the patient and medical records. No language interpreter was used.   HPI Comments: Caitlyn Roy is a 29 y.o. female who presents to the Emergency Department complaining of constant, gradual worsening sore throat onset several days ago. Pt has an associated dry cough. 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 She has not taken any medication to alleviate the pain but has been drinking warm lemon water. Pt denies headache, fevers, chills, ear pain, difficulty breathing, postnasal drip and sinus pressure.  Past Medical History  Diagnosis Date  . Tubal pregnancy   . 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 Vaginal odor   . Vaginitis    Past Surgical History  Procedure Laterality Date  . Biopsy eye muscle    . Cesarean section  2010   Family History  Problem Relation Age of Onset  . Hypertension Mother   . Breast cancer Mother    Social History  Substance Use Topics  . Smoking status: Never Smoker   . Smokeless tobacco: Never Used  . Alcohol Use: Yes     Comment: occ   OB History    Gravida Para Term Preterm AB TAB SAB Ectopic Multiple Living   7 1 1  6  5 1  1      Review of Systems  Constitutional: Negative for fever, chills, appetite change and fatigue.  HENT: Positive for sore throat. Negative for congestion, ear discharge, ear pain, mouth sores, postnasal drip, rhinorrhea and sinus pressure.   Eyes: Negative for visual disturbance.  Respiratory: Positive for cough. Negative for chest tightness, shortness  of breath, wheezing and stridor.   Cardiovascular: Negative for chest pain, palpitations and leg swelling.  Gastrointestinal: Negative for nausea, vomiting, abdominal pain and diarrhea.  Genitourinary: Negative for dysuria, urgency, frequency and hematuria.  Musculoskeletal: Negative for myalgias, back pain, arthralgias and neck stiffness.  Skin: Negative for rash.  Neurological: Negative for syncope, light-headedness, numbness and headaches.  Hematological: Negative for adenopathy.  Psychiatric/Behavioral: The patient is not nervous/anxious.   All other systems reviewed and are negative.     Allergies  Review of patient's allergies indicates no known allergies.  Home Medications   Prior to Admission medications   Medication Sig Start Date End Date Taking? Authorizing Provider  ferrous sulfate 325 (65 FE) MG tablet Take 325 mg by mouth daily with breakfast.    Historical Provider, MD  ibuprofen (ADVIL,MOTRIN) 800 MG tablet Take 1 tablet (800 mg total) by mouth 3 (three) times daily. 05/02/15   Caitlyn Irigoyen, PA-C   Triage vitals: BP 114/76 mmHg  Pulse 101  Temp(Src) 98.7 F (37.1 C) (Oral)  Resp 16  SpO2 100% Physical Exam  Constitutional: She is oriented to person, place, and time. She appears well-developed and well-nourished. No distress.  HENT:  Head: Normocephalic and atraumatic.  Right Ear: Tympanic membrane, external ear and ear canal normal.  Left Ear: Tympanic membrane, external ear and ear canal normal.  Nose: No mucosal edema or rhinorrhea. No epistaxis. Right  sinus exhibits no maxillary sinus tenderness and no frontal sinus tenderness. Left sinus exhibits no maxillary sinus tenderness and no frontal sinus tenderness.  Mouth/Throat: Uvula is midline and mucous membranes are normal. Mucous membranes are not pale and not cyanotic. Posterior oropharyngeal erythema present. No oropharyngeal exudate, posterior oropharyngeal edema or tonsillar abscesses.  Eyes:  Conjunctivae are normal. Pupils are equal, round, and reactive to light.  Neck: Normal range of motion and full passive range of motion without pain.  Cardiovascular: Normal rate and intact distal pulses.   Pulmonary/Chest: Effort normal and breath sounds normal. No stridor.  Clear and equal breath sounds without focal wheezes, rhonchi, rales  Abdominal: Soft. Bowel sounds are normal. There is no tenderness.  Musculoskeletal: Normal range of motion.  Lymphadenopathy:    She has no cervical adenopathy.  Neurological: She is alert and oriented to person, place, and time.  Skin: Skin is warm and dry. No rash noted. She is not diaphoretic.  Psychiatric: She has a normal mood and affect.  Nursing note and vitals reviewed.   ED Course  Procedures  DIAGNOSTIC STUDIES: Oxygen Saturation is 100% on RA, normal by my interpretation.  COORDINATION OF CARE: 3:05 PM Discussed treatment plan which includes tylenol and strep test with pt at bedside and pt agreed to plan.  Labs Review Labs Reviewed  RAPID STREP SCREEN (NOT AT [facility])  CULTURE, GROUP A STREP    MDM   Final diagnoses:  Viral pharyngitis    Caitlyn Roy presents with sore throat.  Pt afebrile without tonsillar exudate, negative strep. Presents with mild cervical lymphadenopathy, & dysphagia; diagnosis of viral pharyngitis. No abx indicated. DC w symptomatic tx for pain  Pt does not appear dehydrated, but did discuss importance of water rehydration. Presentation non concerning for PTA or infxn spread to soft tissue. No trismus or uvula deviation. Specific return precautions discussed. Pt able to drink water in ED without difficulty with intact air way. Recommended PCP follow up.  BP 115/58 mmHg  Pulse 88  Temp(Src) 97.5 F (36.4 C) (Oral)  Resp 21  SpO2 99%  I personally performed the services described in this documentation, which was scribed in my presence. The recorded information has been reviewed and is  accurate.    Caitlyn Soho Averil Digman, PA-C 05/02/15 1610  Caitlyn Christen, MD 05/03/15 [phone]

## 2015-05-02 NOTE — Discharge Instructions (Signed)
1. Medications: usual home medications 2. Treatment: rest, drink plenty of fluids, take tylenol or ibuprofen for fever control and sore throat, warm water gargle 3. Follow Up: Please followup with your primary doctor in 3 days for discussion of your diagnoses and further evaluation after today's visit; if you do not have a primary care doctor use the resource guide provided to find one; Return to the ER for high fevers, difficulty breathing or other concerning symptoms    Pharyngitis Pharyngitis is redness, pain, and swelling (inflammation) of your pharynx.  CAUSES  Pharyngitis is usually caused by infection. Most of the time, these infections are from viruses (viral) and are part of a cold. However, sometimes pharyngitis is caused by bacteria (bacterial). Pharyngitis can also be caused by allergies. Viral pharyngitis may be spread from person to person by coughing, sneezing, and personal items or utensils (cups, forks, spoons, toothbrushes). Bacterial pharyngitis may be spread from person to person by more intimate contact, such as kissing.  SIGNS AND SYMPTOMS  Symptoms of pharyngitis include:   Sore throat.   Tiredness (fatigue).   Low-grade fever.   Headache.  Joint pain and muscle aches.  Skin rashes.  Swollen lymph nodes.  Plaque-like film on throat or tonsils (often seen with bacterial pharyngitis). DIAGNOSIS  Your health care provider will ask you questions about your illness and your symptoms. Your medical history, along with a physical exam, is often all that is needed to diagnose pharyngitis. Sometimes, a rapid strep test is done. Other lab tests may also be done, depending on the suspected cause.  TREATMENT  Viral pharyngitis will usually get better in 3-4 days without the use of medicine. Bacterial pharyngitis is treated with medicines that kill germs (antibiotics).  HOME CARE INSTRUCTIONS   Drink enough water and fluids to keep your urine clear or pale yellow.    Only take over-the-counter or prescription medicines as directed by your health care provider:   If you are prescribed antibiotics, make sure you finish them even if you start to feel better.   Do not take aspirin.   Get lots of rest.   Gargle with 8 oz of salt water ( tsp of salt per 1 qt of water) as often as every 1-2 hours to soothe your throat.   Throat lozenges (if you are not at risk for choking) or sprays may be used to soothe your throat. SEEK MEDICAL CARE IF:   You have large, tender lumps in your neck.  You have a rash.  You cough up green, yellow-brown, or bloody spit. SEEK IMMEDIATE MEDICAL CARE IF:   Your neck becomes stiff.  You drool or are unable to swallow liquids.  You vomit or are unable to keep medicines or liquids down.  You have severe pain that does not go away with the use of recommended medicines.  You have trouble breathing (not caused by a stuffy nose). MAKE SURE YOU:   Understand these instructions.  Will watch your condition.  Will get help right away if you are not doing well or get worse.   This information is not intended to replace advice given to you by your health care provider. Make sure you discuss any questions you have with your health care provider.   Document Released: 04/24/2005 Document Revised: 02/12/2013 Document Reviewed: 12/30/2012 Elsevier Interactive Patient Education [employer].

## 2015-05-02 NOTE — ED Notes (Signed)
Declined W/C at D/C and was escorted to lobby by RN. 

## 2015-05-04 LAB — CULTURE, GROUP A STREP: Strep A Culture: NEGATIVE

## 2015-05-21 DIAGNOSIS — D509 Iron deficiency anemia, unspecified: Secondary | ICD-10-CM | POA: Diagnosis not present

## 2015-05-21 DIAGNOSIS — G932 Benign intracranial hypertension: Secondary | ICD-10-CM | POA: Diagnosis not present

## 2015-05-21 DIAGNOSIS — F39 Unspecified mood [affective] disorder: Secondary | ICD-10-CM | POA: Diagnosis not present

## 2015-05-21 DIAGNOSIS — Z0000 Encounter for general adult medical examination without abnormal findings: Secondary | ICD-10-CM | POA: Diagnosis not present

## 2015-06-03 DIAGNOSIS — N3090 Cystitis, unspecified without hematuria: Secondary | ICD-10-CM | POA: Diagnosis not present

## 2015-06-03 DIAGNOSIS — N760 Acute vaginitis: Secondary | ICD-10-CM | POA: Diagnosis not present

## 2015-06-03 DIAGNOSIS — R350 Frequency of micturition: Secondary | ICD-10-CM | POA: Diagnosis not present

## 2015-08-31 DIAGNOSIS — D509 Iron deficiency anemia, unspecified: Secondary | ICD-10-CM | POA: Diagnosis not present

## 2015-09-04 ENCOUNTER — Encounter (HOSPITAL_COMMUNITY): Payer: Self-pay | Admitting: Emergency Medicine

## 2015-09-04 ENCOUNTER — Emergency Department (HOSPITAL_COMMUNITY)
Admission: EM | Admit: 2015-09-04 | Discharge: 2015-09-04 | Disposition: A | Discharge status: Home / Self Care | Payer: 59 | Attending: Emergency Medicine | Admitting: Emergency Medicine

## 2015-09-04 ENCOUNTER — Emergency Department (HOSPITAL_COMMUNITY): Payer: 59

## 2015-09-04 DIAGNOSIS — Z9889 Other specified postprocedural states: Secondary | ICD-10-CM | POA: Insufficient documentation

## 2015-09-04 DIAGNOSIS — Z8742 Personal history of other diseases of the female genital tract: Secondary | ICD-10-CM | POA: Insufficient documentation

## 2015-09-04 DIAGNOSIS — Z8669 Personal history of other diseases of the nervous system and sense organs: Secondary | ICD-10-CM | POA: Diagnosis not present

## 2015-09-04 DIAGNOSIS — R102 Pelvic and perineal pain: Secondary | ICD-10-CM | POA: Insufficient documentation

## 2015-09-04 DIAGNOSIS — Z3202 Encounter for pregnancy test, result negative: Secondary | ICD-10-CM | POA: Diagnosis not present

## 2015-09-04 DIAGNOSIS — Z791 Long term (current) use of non-steroidal anti-inflammatories (NSAID): Secondary | ICD-10-CM | POA: Diagnosis not present

## 2015-09-04 DIAGNOSIS — Z79899 Other long term (current) drug therapy: Secondary | ICD-10-CM | POA: Insufficient documentation

## 2015-09-04 DIAGNOSIS — R1032 Left lower quadrant pain: Secondary | ICD-10-CM | POA: Insufficient documentation

## 2015-09-04 LAB — COMPREHENSIVE METABOLIC PANEL
ALT: 18 U/L (ref 14–54)
AST: 20 U/L (ref 15–41)
Albumin: 3.8 g/dL (ref 3.5–5.0)
Alkaline Phosphatase: 80 U/L (ref 38–126)
Anion gap: 9 (ref 5–15)
BUN: 10 mg/dL (ref 6–20)
CO2: 25 mmol/L (ref 22–32)
Calcium: 9 mg/dL (ref 8.9–10.3)
Chloride: 104 mmol/L (ref 101–111)
Creatinine, Ser: 0.9 mg/dL (ref 0.44–1.00)
GFR calc Af Amer: >60 mL/min (ref >60)
GFR calc non Af Amer: >60 mL/min (ref >60)
Glucose, Bld: 91 mg/dL (ref 65–99)
Potassium: 3.7 mmol/L (ref 3.5–5.1)
Sodium: 138 mmol/L (ref 135–145)
Total Bilirubin: 0.3 mg/dL (ref 0.3–1.2)
Total Protein: 7.3 g/dL (ref 6.5–8.1)

## 2015-09-04 LAB — CBC
HCT: 34.9 % — ABNORMAL LOW (ref 36.0–46.0)
Hemoglobin: 11.1 g/dL — ABNORMAL LOW (ref 12.0–15.0)
MCH: 26.9 pg (ref 26.0–34.0)
MCHC: 31.8 g/dL (ref 30.0–36.0)
MCV: 84.7 fL (ref 78.0–100.0)
Platelets: 399 10*3/uL (ref 150–400)
RBC: 4.12 MIL/uL (ref 3.87–5.11)
RDW: 14 % (ref 11.5–15.5)
WBC: 6.9 10*3/uL (ref 4.0–10.5)

## 2015-09-04 LAB — URINALYSIS, ROUTINE W REFLEX MICROSCOPIC
Bilirubin Urine: NEGATIVE
Glucose, UA: NEGATIVE mg/dL
Ketones, ur: NEGATIVE mg/dL
Leukocytes, UA: NEGATIVE
Nitrite: NEGATIVE
Protein, ur: NEGATIVE mg/dL
Specific Gravity, Urine: 1.011 (ref 1.005–1.030)
pH: 7 (ref 5.0–8.0)

## 2015-09-04 LAB — WET PREP, GENITAL
Sperm: NONE SEEN
Trich, Wet Prep: NONE SEEN
WBC, Wet Prep HPF POC: NONE SEEN
Yeast Wet Prep HPF POC: NONE SEEN

## 2015-09-04 LAB — URINE MICROSCOPIC-ADD ON: WBC, UA: NONE SEEN WBC/hpf (ref 0–5)

## 2015-09-04 LAB — LIPASE, BLOOD: Lipase: 27 U/L (ref 11–51)

## 2015-09-04 LAB — POC URINE PREG, ED: Preg Test, Ur: NEGATIVE

## 2015-09-04 MED ORDER — OXYCODONE-ACETAMINOPHEN 5-325 MG PO TABS: 1.0000 | ORAL_TABLET | ORAL | Status: DC | PRN

## 2015-09-04 MED ORDER — OXYCODONE-ACETAMINOPHEN 5-325 MG PO TABS
1.0000 | ORAL_TABLET | Freq: Once | ORAL | Status: DC
  Filled 2015-09-04: qty 1

## 2015-09-04 MED ORDER — KETOROLAC TROMETHAMINE 60 MG/2ML IM SOLN
60.0000 mg | Freq: Once | INTRAMUSCULAR | Status: AC
  Administered 2015-09-04: 60 mg via INTRAMUSCULAR
  Filled 2015-09-04: qty 2

## 2015-09-04 NOTE — ED Provider Notes (Signed)
Physical Exam  Genitourinary: There is no rash, tenderness, lesion or injury on the right labia. There is no rash, tenderness, lesion or injury on the left labia. Uterus is not tender. Cervix exhibits no motion tenderness and no friability. Right adnexum displays no mass, no tenderness and no fullness. Left adnexum displays no mass, no tenderness and no fullness. No bleeding in the vagina. No foreign body around the vagina. Vaginal discharge (moderate white, pale yellow discharge) found.  No CMT or adnexal TTP. No masses. Uterine, nontender.     Antonietta Breach, PA-C 09/04/15 [place], MD 09/04/15 [phone]

## 2015-09-04 NOTE — Discharge Instructions (Signed)

## 2015-09-04 NOTE — ED Provider Notes (Signed)
CSN: [id number]     Arrival date & time 09/04/15  0008 History  By signing my name below, I, Emmanuella Mensah, attest that this documentation has been prepared under the direction and in the presence of Orpah Greek, MD. Electronically Signed: Judithann Sauger, ED Scribe. 09/04/2015. 1:07 AM.      Chief Complaint  Patient presents with  . Abdominal Pain   Patient is a 30 y.o. female presenting with abdominal pain. The history is provided by the patient. No language interpreter was used.  Abdominal Pain Pain location:  LLQ Pain quality: sharp   Pain radiates to:  Does not radiate Pain severity:  Moderate Onset quality:  Gradual Timing:  Intermittent Progression:  Worsening Chronicity:  New Relieved by:  None tried Worsened by:  Nothing tried Ineffective treatments:  None tried Associated symptoms: no diarrhea, no fever, no hematuria, no nausea, no vaginal bleeding, no vaginal discharge and no vomiting    HPI Comments: Caitlyn Roy is a 30 y.o. female who presents to the Emergency Department complaining of gradually worsening intermittent sharp left lower quadrant and pelvic pain onset yesterday morning. Pt denies any recent injuries or noticing anything specific that brings on the pain. No alleviating factors noted. Pt has not tried any medications PTA. Pt's LMP was recent and she adds that it was normal. She denies any fever, urinary symptoms, vaginal bleeding, vaginal discharge, or n/v.     Past Medical History  Diagnosis Date  . Tubal pregnancy   . 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 Vaginal odor   . Vaginitis    Past Surgical History  Procedure Laterality Date  . Biopsy eye muscle    . Cesarean section  2010   Family History  Problem Relation Age of Onset  . Hypertension Mother   . Breast cancer Mother    Social History  Substance Use Topics  . Smoking status: Never Smoker   . Smokeless  tobacco: Never Used  . Alcohol Use: Yes     Comment: occ   OB History    Gravida Para Term Preterm AB TAB SAB Ectopic Multiple Living   7 1 1  6  5 1  1      Review of Systems  Constitutional: Negative for fever.  Gastrointestinal: Positive for abdominal pain. Negative for nausea, vomiting and diarrhea.  Genitourinary: Positive for pelvic pain. Negative for hematuria, vaginal bleeding and vaginal discharge.  All other systems reviewed and are negative.     Allergies  Review of patient's allergies indicates no known allergies.  Home Medications   Prior to Admission medications   Medication Sig Start Date End Date Taking? Authorizing Provider  ferrous sulfate 325 (65 FE) MG tablet Take 325 mg by mouth daily with breakfast.    Historical Provider, MD  ibuprofen (ADVIL,MOTRIN) 800 MG tablet Take 1 tablet (800 mg total) by mouth 3 (three) times daily. 05/02/15   Hannah Muthersbaugh, PA-C   BP 101/67 mmHg  Pulse 96  Temp(Src) 98.9 F (37.2 C) (Oral)  Resp 20  SpO2 100%  LMP 08/29/2015 Physical Exam  Constitutional: She is oriented to person, place, and time. She appears well-developed and well-nourished. No distress.  HENT:  Head: Normocephalic and atraumatic.  Right Ear: Hearing normal.  Left Ear: Hearing normal.  Nose: Nose normal.  Mouth/Throat: Oropharynx is clear and moist and mucous membranes are normal.  Eyes: Conjunctivae and EOM are normal. Pupils are equal,  round, and reactive to light.  Neck: Normal range of motion. Neck supple.  Cardiovascular: Regular rhythm, S1 normal and S2 normal.  Exam reveals no gallop and no friction rub.   No murmur heard. Pulmonary/Chest: Effort normal and breath sounds normal. No respiratory distress. She exhibits no tenderness.  Abdominal: Soft. Normal appearance and bowel sounds are normal. There is no hepatosplenomegaly. There is no tenderness. There is no rebound, no guarding, no tenderness at McBurney's point and negative Murphy's  sign. No hernia.  Musculoskeletal: Normal range of motion.  Neurological: She is alert and oriented to person, place, and time. She has normal strength. No cranial nerve deficit or sensory deficit. Coordination normal. GCS eye subscore is 4. GCS verbal subscore is 5. GCS motor subscore is 6.  Skin: Skin is warm, dry and intact. No rash noted. No cyanosis.  Psychiatric: She has a normal mood and affect. Her speech is normal and behavior is normal. Thought content normal.  Nursing note and vitals reviewed.   ED Course  Procedures (including critical care time) DIAGNOSTIC STUDIES: Oxygen Saturation is 100% on RA, normal by my interpretation.    COORDINATION OF CARE: 1:03 AM- Pt advised of plan for treatment and pt agrees. Pt will provide urine sample for UA. Pt will receive a pelvic examination for further evaluation.    Labs Review Labs Reviewed  LIPASE, BLOOD  COMPREHENSIVE METABOLIC PANEL  CBC  URINALYSIS, ROUTINE W REFLEX MICROSCOPIC (NOT AT [facility])  POC URINE PREG, ED    Imaging Review No results found.   Orpah Greek, MD has personally reviewed and evaluated these images and lab results as part of his medical decision-making.   EKG Interpretation None      MDM   Final diagnoses:  None  Pelvic pain  Patient presented for evaluation of pelvic pain. Patient reports that she has been experiencing pain for most of the day. She did have nausea and vomiting several days ago, but that has resolved and has not returned. She has not had associated diarrhea. Patient denies urine symptoms as well has vaginal discharge and bleeding. Examination was benign. She did not have any tenderness to palpation on examination. She indicates the area of pain in the left inguinal region and low pelvis. There is no left lower quadrant tenderness that would suggest diverticulitis, colitis, etc.  Patient has normal blood work. No acute abnormality is noted. Her urinalysis does not suggest  infection.  Patient was unhappy with her care today. She became angry with her nurses and myself. I did offer her pain medication but she did not wish to have any pain medication provided to her today. I had Antonietta Breach, PA-C perform a pelvic exam. There are no acute abnormalities noted on the pelvic exam. Pelvic ultrasound was performed including Doppler and there is no evidence of torsion or other abnormality.  Patient had 0-5 red cells in her urine, but presentation doesn't seem consistent with a kidney stone. She reports finishing her menstrual cycle 2 or 3 days ago. This is likely residual menstrual blood. I discussed these findings with her. I did discuss with her the possibility of a kidney stone. She does not want to stay in the ER for a CT scan at this point. I will therefore provide her with analgesia and she can return to the ER if she is not improving in the next 24 hours or so.  I discussed with the patient that I did not have a reason for her pain  today. I do not feel that there is an acute surgical process or life-threatening process present. I will provide her with pain medication to use at home and I recommended she follow-up with her primary doctor and her OB/GYN as soon as possible.  I personally performed the services described in this documentation, which was scribed in my presence. The recorded information has been reviewed and is accurate.     Orpah Greek, MD 09/04/15 [phone]

## 2015-09-04 NOTE — ED Notes (Signed)
C/o LLQ/pelvic pain since Friday morning.  Reports nausea and vomiting 3 days ago that resolved.  Denies n/v/d today.  Denies urinary complaint.

## 2015-09-06 LAB — GC/CHLAMYDIA PROBE AMP (~~LOC~~) NOT AT ARMC
Chlamydia: NEGATIVE
Neisseria Gonorrhea: NEGATIVE

## 2015-10-01 DIAGNOSIS — B373 Candidiasis of vulva and vagina: Secondary | ICD-10-CM | POA: Diagnosis not present

## 2015-10-01 DIAGNOSIS — M222X9 Patellofemoral disorders, unspecified knee: Secondary | ICD-10-CM | POA: Diagnosis not present

## 2015-10-01 DIAGNOSIS — N760 Acute vaginitis: Secondary | ICD-10-CM | POA: Diagnosis not present

## 2015-10-01 DIAGNOSIS — R300 Dysuria: Secondary | ICD-10-CM | POA: Diagnosis not present

## 2015-10-01 DIAGNOSIS — N3000 Acute cystitis without hematuria: Secondary | ICD-10-CM | POA: Diagnosis not present

## 2015-11-11 DIAGNOSIS — D509 Iron deficiency anemia, unspecified: Secondary | ICD-10-CM | POA: Diagnosis not present

## 2015-12-06 DIAGNOSIS — R079 Chest pain, unspecified: Secondary | ICD-10-CM | POA: Insufficient documentation

## 2015-12-06 DIAGNOSIS — Z5321 Procedure and treatment not carried out due to patient leaving prior to being seen by health care provider: Secondary | ICD-10-CM | POA: Diagnosis not present

## 2015-12-07 ENCOUNTER — Emergency Department (HOSPITAL_COMMUNITY)
Admission: EM | Admit: 2015-12-07 | Discharge: 2015-12-07 | Disposition: A | Discharge status: Home / Self Care | Payer: 59 | Attending: Emergency Medicine | Admitting: Emergency Medicine

## 2015-12-07 ENCOUNTER — Encounter (HOSPITAL_COMMUNITY): Payer: Self-pay | Admitting: Emergency Medicine

## 2015-12-07 DIAGNOSIS — Z5321 Procedure and treatment not carried out due to patient leaving prior to being seen by health care provider: Secondary | ICD-10-CM | POA: Diagnosis not present

## 2015-12-07 DIAGNOSIS — R079 Chest pain, unspecified: Secondary | ICD-10-CM | POA: Diagnosis not present

## 2015-12-07 HISTORY — DX: Anxiety disorder, unspecified: F41.9

## 2015-12-07 LAB — CBC
HCT: 34.5 % — ABNORMAL LOW (ref 36.0–46.0)
Hemoglobin: 10.7 g/dL — ABNORMAL LOW (ref 12.0–15.0)
MCH: 26.6 pg (ref 26.0–34.0)
MCHC: 31 g/dL (ref 30.0–36.0)
MCV: 85.6 fL (ref 78.0–100.0)
Platelets: 353 10*3/uL (ref 150–400)
RBC: 4.03 MIL/uL (ref 3.87–5.11)
RDW: 14 % (ref 11.5–15.5)
WBC: 5.8 10*3/uL (ref 4.0–10.5)

## 2015-12-07 LAB — BASIC METABOLIC PANEL
Anion gap: 9 (ref 5–15)
BUN: 7 mg/dL (ref 6–20)
CO2: 25 mmol/L (ref 22–32)
Calcium: 9 mg/dL (ref 8.9–10.3)
Chloride: 104 mmol/L (ref 101–111)
Creatinine, Ser: 0.78 mg/dL (ref 0.44–1.00)
GFR calc Af Amer: >60 mL/min (ref >60)
GFR calc non Af Amer: >60 mL/min (ref >60)
Glucose, Bld: 110 mg/dL — ABNORMAL HIGH (ref 65–99)
Potassium: 3 mmol/L — ABNORMAL LOW (ref 3.5–5.1)
Sodium: 138 mmol/L (ref 135–145)

## 2015-12-07 LAB — I-STAT TROPONIN, ED: Troponin i, poc: 0 ng/mL (ref 0.00–0.08)

## 2015-12-07 LAB — POC URINE PREG, ED: Preg Test, Ur: NEGATIVE

## 2015-12-07 NOTE — ED Notes (Signed)
Pt called for room no answer

## 2015-12-07 NOTE — ED Triage Notes (Signed)
Pt. reports central chest pain with SOB and occasional dry cough onset this evening , denies emesis or diaphoresis .

## 2016-01-27 ENCOUNTER — Encounter (HOSPITAL_COMMUNITY): Payer: Self-pay | Admitting: *Deleted

## 2016-01-27 DIAGNOSIS — K047 Periapical abscess without sinus: Secondary | ICD-10-CM | POA: Diagnosis not present

## 2016-01-27 DIAGNOSIS — R0789 Other chest pain: Secondary | ICD-10-CM | POA: Diagnosis not present

## 2016-01-27 DIAGNOSIS — R5383 Other fatigue: Secondary | ICD-10-CM | POA: Insufficient documentation

## 2016-01-27 DIAGNOSIS — R079 Chest pain, unspecified: Secondary | ICD-10-CM | POA: Diagnosis not present

## 2016-01-27 DIAGNOSIS — K0889 Other specified disorders of teeth and supporting structures: Secondary | ICD-10-CM | POA: Diagnosis not present

## 2016-01-27 NOTE — ED Triage Notes (Signed)
Pt c/o chest pain x 2 months and dental pain. Reports a broken tooth x 1 year with worsening pain for a couple days

## 2016-01-28 ENCOUNTER — Emergency Department (HOSPITAL_COMMUNITY): Payer: 59

## 2016-01-28 ENCOUNTER — Emergency Department (HOSPITAL_COMMUNITY)
Admission: EM | Admit: 2016-01-28 | Discharge: 2016-01-28 | Disposition: A | Discharge status: Home / Self Care | Payer: 59 | Attending: Emergency Medicine | Admitting: Emergency Medicine

## 2016-01-28 DIAGNOSIS — R5383 Other fatigue: Secondary | ICD-10-CM | POA: Diagnosis not present

## 2016-01-28 DIAGNOSIS — K0889 Other specified disorders of teeth and supporting structures: Secondary | ICD-10-CM

## 2016-01-28 DIAGNOSIS — K047 Periapical abscess without sinus: Secondary | ICD-10-CM | POA: Diagnosis not present

## 2016-01-28 DIAGNOSIS — R079 Chest pain, unspecified: Secondary | ICD-10-CM

## 2016-01-28 LAB — BASIC METABOLIC PANEL
Anion gap: 7 (ref 5–15)
BUN: 7 mg/dL (ref 6–20)
CO2: 26 mmol/L (ref 22–32)
Calcium: 8.7 mg/dL — ABNORMAL LOW (ref 8.9–10.3)
Chloride: 104 mmol/L (ref 101–111)
Creatinine, Ser: 0.94 mg/dL (ref 0.44–1.00)
GFR calc Af Amer: >60 mL/min (ref >60)
GFR calc non Af Amer: >60 mL/min (ref >60)
Glucose, Bld: 91 mg/dL (ref 65–99)
Potassium: 3.3 mmol/L — ABNORMAL LOW (ref 3.5–5.1)
Sodium: 137 mmol/L (ref 135–145)

## 2016-01-28 LAB — URINALYSIS, ROUTINE W REFLEX MICROSCOPIC
Bilirubin Urine: NEGATIVE
Glucose, UA: NEGATIVE mg/dL
Ketones, ur: NEGATIVE mg/dL
Nitrite: NEGATIVE
Protein, ur: NEGATIVE mg/dL
Specific Gravity, Urine: 1.022 (ref 1.005–1.030)
pH: 7.5 (ref 5.0–8.0)

## 2016-01-28 LAB — URINE MICROSCOPIC-ADD ON

## 2016-01-28 LAB — I-STAT TROPONIN, ED
Troponin i, poc: 0 ng/mL (ref 0.00–0.08)
Troponin i, poc: 0 ng/mL (ref 0.00–0.08)

## 2016-01-28 LAB — CBC
HCT: 33.2 % — ABNORMAL LOW (ref 36.0–46.0)
Hemoglobin: 10.3 g/dL — ABNORMAL LOW (ref 12.0–15.0)
MCH: 26.5 pg (ref 26.0–34.0)
MCHC: 31 g/dL (ref 30.0–36.0)
MCV: 85.6 fL (ref 78.0–100.0)
Platelets: 390 10*3/uL (ref 150–400)
RBC: 3.88 MIL/uL (ref 3.87–5.11)
RDW: 14 % (ref 11.5–15.5)
WBC: 5.8 10*3/uL (ref 4.0–10.5)

## 2016-01-28 LAB — TSH: TSH: 2.211 u[IU]/mL (ref 0.350–4.500)

## 2016-01-28 LAB — PREGNANCY, URINE: Preg Test, Ur: NEGATIVE

## 2016-01-28 LAB — T4, FREE: Free T4: 0.89 ng/dL (ref 0.61–1.12)

## 2016-01-28 MED ORDER — IBUPROFEN 800 MG PO TABS
800.0000 mg | ORAL_TABLET | Freq: Once | ORAL | Status: AC
  Administered 2016-01-28: 800 mg via ORAL
  Filled 2016-01-28: qty 1

## 2016-01-28 MED ORDER — PENICILLIN V POTASSIUM 500 MG PO TABS: 500.0000 mg | ORAL_TABLET | Freq: Three times a day (TID) | ORAL | 0 refills | Status: DC

## 2016-01-28 MED ORDER — PENICILLIN V POTASSIUM 250 MG PO TABS
500.0000 mg | ORAL_TABLET | Freq: Once | ORAL | Status: AC
  Administered 2016-01-28: 500 mg via ORAL
  Filled 2016-01-28: qty 2

## 2016-01-28 MED ORDER — POTASSIUM CHLORIDE CRYS ER 20 MEQ PO TBCR: 20.0000 meq | EXTENDED_RELEASE_TABLET | Freq: Every day | ORAL | 0 refills | Status: DC

## 2016-01-28 MED ORDER — POTASSIUM CHLORIDE CRYS ER 20 MEQ PO TBCR
80.0000 meq | EXTENDED_RELEASE_TABLET | Freq: Once | ORAL | Status: AC
  Administered 2016-01-28: 80 meq via ORAL
  Filled 2016-01-28: qty 4

## 2016-01-28 NOTE — Discharge Instructions (Signed)
1. Medications: usual home medications 2. Treatment: rest, drink plenty of fluids,  3. Follow Up: Please followup with your primary doctor in 2 days for discussion of your diagnoses and further evaluation after today's visit; if you do not have a primary care doctor use the resource guide provided to find one; Please return to the ER for worsening symptoms, syncope, development of new or concerning symptoms

## 2016-01-28 NOTE — ED Notes (Signed)
Pt approached nursing station stating she needed to leave and requesting discharge paperwork.

## 2016-01-28 NOTE — ED Notes (Addendum)
Pt ambulatory up to nurses desk reports she's "having a reaction".  Pt reports her throat is closing up, speaking clear and handling secretions; reports shortness of breath.  O2 applied via nasal cannula at 2 liters, pt instructed to slow her breathing down. Pulse ox at 100% pulse: 74.  Pt denies itching or rash, denies any swelling in her tongue.

## 2016-01-28 NOTE — ED Provider Notes (Signed)
[facility] DEPT Provider Note   CSN: [id number] Arrival date & time: 01/27/16  2342     History   Chief Complaint Chief Complaint  Patient presents with  . Chest Pain  . Dental Pain    HPI Caitlyn Roy is a 30 y.o. female with a hx of orbital pseudotumor, anxiety presents to the Emergency Department complaining of intermittent, progressively worsening chest pain described as her heart skipping beats and a heaviness onset off and on for the last month.  She reports she left prior to obtaining her labs last time, but thinks her potassium was low.  Pt reports that drinking orange juice and eating bananas makes the pain better.  Nothing makes it worse.  Pt denies Cocaine, caffeine, smoking, IVDU.  She does endorse EtOH 2-3 drinks per week.    Pt reports she developed lower right sided dental pain.  She reports it was cracked "some time ago" but the pain began approx 1 month ago.  Pt reports she has seen a dentist, but has not had any additional treatment.  She reports she talked to her dentist today who told her to come to the ER for an abx prescription.  Denies fever, chills, neck pain, SOB, abd pain, N/V, weakness, dizziness, syncope.      HPI  Past Medical History:  Diagnosis Date  . Anxiety   . 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 Tubal pregnancy   . Vaginal odor   . Vaginitis     Patient Active Problem List   Diagnosis Date Noted  . BV (bacterial vaginosis) 10/15/2014    Past Surgical History:  Procedure Laterality Date  . BIOPSY EYE MUSCLE    . CESAREAN SECTION  2010    OB History    Gravida Para Term Preterm AB Living   7 1 1   6 1    SAB TAB Ectopic Multiple Live Births   5   1   1        Home Medications    Prior to Admission medications   Medication Sig Start Date End Date Taking? Authorizing Provider  penicillin v potassium (VEETID) 500 MG tablet Take 1 tablet (500 mg total) by  mouth 3 (three) times daily. 01/28/16   Emmalynn Pinkham, PA-C  potassium chloride SA (K-DUR,KLOR-CON) 20 MEQ tablet Take 1 tablet (20 mEq total) by mouth daily. 01/28/16   Jarrett Soho Nahshon Reich, PA-C    Family History Family History  Problem Relation Age of Onset  . Hypertension Mother   . Breast cancer Mother     Social History Social History  Substance Use Topics  . Smoking status: Never Smoker  . Smokeless tobacco: Never Used  . Alcohol use Yes     Comment: occ     Allergies   Review of patient's allergies indicates no known allergies.   Review of Systems Review of Systems   Physical Exam Updated Vital Signs BP 117/79   Pulse 90   Temp 99 F (37.2 C) (Oral)   Resp 19   Ht 5\' 2"  (1.575 m)   Wt 72.6 kg   LMP 01/22/2016   SpO2 100%   BMI 29.26 kg/m   Physical Exam  Constitutional: She appears well-developed and well-nourished. No distress.  Awake, alert, nontoxic appearance  HENT:  Head: Normocephalic and atraumatic.  Mouth/Throat: Oropharynx is clear and moist. No oropharyngeal exudate.  Very small apical abscess to the vehicle side of the right mandibular  premolar No induration or tenderness to the floor of the mouth Secretions without difficulty  Eyes: Conjunctivae are normal. No scleral icterus.  Neck: Normal range of motion. Neck supple.  Cardiovascular: Normal rate and intact distal pulses.  An irregular rhythm present.  Pulses:      Radial pulses are 2+ on the right side, and 2+ on the left side.  In this arrhythmia on the monitor  Pulmonary/Chest: Effort normal and breath sounds normal. No respiratory distress. She has no wheezes.  Equal chest expansion  Abdominal: Soft. Bowel sounds are normal. She exhibits no mass. There is no tenderness. There is no rebound and no guarding.  Musculoskeletal: Normal range of motion. She exhibits no edema.  Neurological: She is alert.  Speech is clear and goal oriented Moves extremities without ataxia  Skin:  Skin is warm and dry. She is not diaphoretic.  Psychiatric: She has a normal mood and affect.  Nursing note and vitals reviewed.    ED Treatments / Results  Labs (all labs ordered are listed, but only abnormal results are displayed) Labs Reviewed  BASIC METABOLIC PANEL - Abnormal; Notable for the following:       Result Value   Potassium 3.3 (*)    Calcium 8.7 (*)    All other components within normal limits  CBC - Abnormal; Notable for the following:    Hemoglobin 10.3 (*)    HCT 33.2 (*)    All other components within normal limits  URINALYSIS, ROUTINE W REFLEX MICROSCOPIC (NOT AT [facility]) - Abnormal; Notable for the following:    APPearance CLOUDY (*)    Hgb urine dipstick TRACE (*)    Leukocytes, UA TRACE (*)    All other components within normal limits  URINE MICROSCOPIC-ADD ON - Abnormal; Notable for the following:    Squamous Epithelial / LPF 6-30 (*)    Bacteria, UA RARE (*)    All other components within normal limits  TSH  T4, FREE  PREGNANCY, URINE  I-STAT TROPOININ, ED  I-STAT TROPOININ, ED    EKG  EKG Interpretation  Date/Time:  Thursday January 27 2016 23:51:56 EDT Ventricular Rate:  86 PR Interval:  130 QRS Duration: 70 QT Interval:  350 QTC Calculation: 418 R Axis:   49 Text Interpretation:  Sinus rhythm with marked sinus arrhythmia Sinus arrhythmia new since last tracing Confirmed by Glynn Octave [phone]) on 01/28/2016 5:52:10 AM       Radiology Dg Chest 2 View  Result Date: 01/28/2016 CLINICAL DATA:  Acute onset of generalized chest pain and shortness of breath. Initial encounter. EXAM: CHEST  2 VIEW COMPARISON:  Chest radiograph performed 02/25/2014 FINDINGS: The lungs are well-aerated and clear. There is no evidence of focal opacification, pleural effusion or pneumothorax. The heart is normal in size; the mediastinal contour is within normal limits. No acute osseous abnormalities are seen. IMPRESSION: No acute cardiopulmonary process  seen. Electronically Signed   By: Garald Balding M.D.   On: 01/28/2016 06:32    Procedures Procedures (including critical care time)  Medications Ordered in ED Medications  penicillin v potassium (VEETID) tablet 500 mg (500 mg Oral Given 01/28/16 0602)  ibuprofen (ADVIL,MOTRIN) tablet 800 mg (800 mg Oral Given 01/28/16 0602)  potassium chloride SA (K-DUR,KLOR-CON) CR tablet 80 mEq (80 mEq Oral Given 01/28/16 0709)     Initial Impression / Assessment and Plan / ED Course  I have reviewed the triage vital signs and the nursing notes.  Pertinent labs & imaging results  that were available during my care of the patient were reviewed by me and considered in my medical decision making (see chart for details).  Clinical Course  Value Comment By Time  DG Chest 2 View (Reviewed) Abigail Butts, PA-C 09/22 0447  Hemoglobin: (!) 10.3 Pt reports baseline.  Pt reports she is prescribed iron tablets, but she stopped because "they didn't help."    Abigail Butts, PA-C 09/22 0448  Potassium: (!) 3.3 Mild hypokalemia, repleated in the department.   Jarrett Soho Sui Kasparek, PA-C 09/22 0448   Pt reports feeling cold, tired, weight gain, hair loss,  Abigail Butts, PA-C 09/22 0449  TSH: 2.211 Within normal limits Abigail Butts, PA-C 09/22 [id number]  T4,Free(Direct): 0.89 Within normal limits Abigail Butts, PA-C 09/22 [id number]    Patient with chest pain. Symptoms concerning for hypothyroid however TSH and free T4 are within normal limits. Sinus arrhythmia on EKG but no other EKG changes. Initial and delta troponin are negative. Hypokalemia repleted in the department and patient will be discharged home with a short course of Klor-Con.  She also is very small dental abscess which was too small for I&D. She will be given penicillin and has close dental follow-up. She is afebrile. No evidence of Ludwig angina. Strict return precautions given including fevers, chills, difficulty swallowing, worsening  chest pain or other concerns.    Final Clinical Impressions(s) / ED Diagnoses   Final diagnoses:  Chest pain, unspecified chest pain type  Other fatigue  Pain, dental  Dental infection    New Prescriptions New Prescriptions   PENICILLIN V POTASSIUM (VEETID) 500 MG TABLET    Take 1 tablet (500 mg total) by mouth 3 (three) times daily.   POTASSIUM CHLORIDE SA (K-DUR,KLOR-CON) 20 MEQ TABLET    Take 1 tablet (20 mEq total) by mouth daily.     Jarrett Soho Laraina Sulton, PA-C 01/28/16 [id number]    Everlene Balls, MD 01/28/16 1454

## 2016-01-30 ENCOUNTER — Emergency Department (HOSPITAL_COMMUNITY)
Admission: EM | Admit: 2016-01-30 | Discharge: 2016-01-30 | Disposition: A | Discharge status: Home / Self Care | Payer: 59 | Attending: Emergency Medicine | Admitting: Emergency Medicine

## 2016-01-30 ENCOUNTER — Emergency Department (HOSPITAL_COMMUNITY): Payer: 59

## 2016-01-30 ENCOUNTER — Encounter (HOSPITAL_COMMUNITY): Payer: Self-pay | Admitting: Emergency Medicine

## 2016-01-30 DIAGNOSIS — M62830 Muscle spasm of back: Secondary | ICD-10-CM | POA: Diagnosis not present

## 2016-01-30 DIAGNOSIS — R0789 Other chest pain: Secondary | ICD-10-CM | POA: Diagnosis not present

## 2016-01-30 DIAGNOSIS — M546 Pain in thoracic spine: Secondary | ICD-10-CM | POA: Diagnosis present

## 2016-01-30 DIAGNOSIS — F419 Anxiety disorder, unspecified: Secondary | ICD-10-CM | POA: Insufficient documentation

## 2016-01-30 DIAGNOSIS — R079 Chest pain, unspecified: Secondary | ICD-10-CM | POA: Diagnosis not present

## 2016-01-30 LAB — CBC
HCT: 34.6 % — ABNORMAL LOW (ref 36.0–46.0)
Hemoglobin: 10.9 g/dL — ABNORMAL LOW (ref 12.0–15.0)
MCH: 26.8 pg (ref 26.0–34.0)
MCHC: 31.5 g/dL (ref 30.0–36.0)
MCV: 85 fL (ref 78.0–100.0)
Platelets: 441 10*3/uL — ABNORMAL HIGH (ref 150–400)
RBC: 4.07 MIL/uL (ref 3.87–5.11)
RDW: 13.8 % (ref 11.5–15.5)
WBC: 7.1 10*3/uL (ref 4.0–10.5)

## 2016-01-30 LAB — I-STAT TROPONIN, ED: Troponin i, poc: 0 ng/mL (ref 0.00–0.08)

## 2016-01-30 LAB — BASIC METABOLIC PANEL
Anion gap: 12 (ref 5–15)
BUN: 8 mg/dL (ref 6–20)
CO2: 21 mmol/L — ABNORMAL LOW (ref 22–32)
Calcium: 8.8 mg/dL — ABNORMAL LOW (ref 8.9–10.3)
Chloride: 101 mmol/L (ref 101–111)
Creatinine, Ser: 0.83 mg/dL (ref 0.44–1.00)
GFR calc Af Amer: >60 mL/min (ref >60)
GFR calc non Af Amer: >60 mL/min (ref >60)
Glucose, Bld: 103 mg/dL — ABNORMAL HIGH (ref 65–99)
Potassium: 3.3 mmol/L — ABNORMAL LOW (ref 3.5–5.1)
Sodium: 134 mmol/L — ABNORMAL LOW (ref 135–145)

## 2016-01-30 NOTE — ED Provider Notes (Signed)
[facility] DEPT Provider Note   CSN: [id number] Arrival date & time: 01/30/16  2002     History   Chief Complaint Chief Complaint  Patient presents with  . Chest Pain    HPI Caitlyn Roy is a 30 y.o. female.  The history is provided by the patient.  Chest Pain   This is a new problem. The current episode started more than 1 week ago. Episode frequency: intermittent; constant for 6 hrs. The problem has been gradually worsening. The pain is associated with movement. The pain is present in the lateral region. The pain is moderate. The quality of the pain is described as pressure-like. The pain radiates to the upper back and left arm. The symptoms are aggravated by certain positions. Associated symptoms include back pain and palpitations. Pertinent negatives include no abdominal pain, no cough, no fever, no leg pain, no lower extremity edema, no nausea, no shortness of breath and no vomiting. Risk factors include stress.  Pertinent negatives for past medical history include no seizures.    Past Medical History:  Diagnosis Date  . Anxiety   . 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 Tubal pregnancy   . Vaginal odor   . Vaginitis     Patient Active Problem List   Diagnosis Date Noted  . BV (bacterial vaginosis) 10/15/2014    Past Surgical History:  Procedure Laterality Date  . BIOPSY EYE MUSCLE    . CESAREAN SECTION  2010    OB History    Gravida Para Term Preterm AB Living   7 1 1   6 1    SAB TAB Ectopic Multiple Live Births   5   1   1        Home Medications    Prior to Admission medications   Medication Sig Start Date End Date Taking? Authorizing Provider  penicillin v potassium (VEETID) 500 MG tablet Take 1 tablet (500 mg total) by mouth 3 (three) times daily. 01/28/16  Yes Hannah Muthersbaugh, PA-C  iron polysaccharides (NIFEREX) 150 MG capsule Take 150 mg by mouth daily.    Historical  Provider, MD  potassium chloride SA (K-DUR,KLOR-CON) 20 MEQ tablet Take 1 tablet (20 mEq total) by mouth daily. Patient not taking: Reported on 01/30/2016 01/28/16   Jarrett Soho Muthersbaugh, PA-C    Family History Family History  Problem Relation Age of Onset  . Hypertension Mother   . Breast cancer Mother     Social History Social History  Substance Use Topics  . Smoking status: Never Smoker  . Smokeless tobacco: Never Used  . Alcohol use Yes     Comment: occ     Allergies   Oxycodone   Review of Systems Review of Systems  Constitutional: Negative for chills and fever.  HENT: Negative for ear pain and sore throat.   Eyes: Negative for pain and visual disturbance.  Respiratory: Negative for cough and shortness of breath.   Cardiovascular: Positive for chest pain and palpitations.  Gastrointestinal: Negative for abdominal pain, nausea and vomiting.  Genitourinary: Negative for dysuria and hematuria.  Musculoskeletal: Positive for back pain. Negative for arthralgias.  Skin: Negative for color change and rash.  Neurological: Negative for seizures and syncope.  All other systems reviewed and are negative.    Physical Exam Updated Vital Signs BP 114/56 (BP Location: Right Arm)   Pulse 90   Temp 98.5 F (36.9 C) (Oral)   Resp 16   Ht  5\' 2"  (1.575 m)   Wt 160 lb (72.6 kg)   LMP 01/22/2016   SpO2 100%   BMI 29.26 kg/m   Physical Exam  Constitutional: She is oriented to person, place, and time. She appears well-developed and well-nourished. No distress.  HENT:  Head: Normocephalic and atraumatic.  Nose: Nose normal.  Eyes: Conjunctivae and EOM are normal. Pupils are equal, round, and reactive to light. Right eye exhibits no discharge. Left eye exhibits no discharge. No scleral icterus.  Neck: Normal range of motion. Neck supple.  Cardiovascular: Normal rate and regular rhythm.  Exam reveals no gallop and no friction rub.   No murmur heard. Pulmonary/Chest: Effort  normal and breath sounds normal. No stridor. No respiratory distress. She has no rales.  Abdominal: Soft. She exhibits no distension. There is no tenderness.  Musculoskeletal: She exhibits no edema or tenderness.  Neurological: She is alert and oriented to person, place, and time.  Skin: Skin is warm and dry. No rash noted. She is not diaphoretic. No erythema.  Psychiatric: She has a normal mood and affect.  Vitals reviewed.    ED Treatments / Results  Labs (all labs ordered are listed, but only abnormal results are displayed) Labs Reviewed  BASIC METABOLIC PANEL - Abnormal; Notable for the following:       Result Value   Sodium 134 (*)    Potassium 3.3 (*)    CO2 21 (*)    Glucose, Bld 103 (*)    Calcium 8.8 (*)    All other components within normal limits  CBC - Abnormal; Notable for the following:    Hemoglobin 10.9 (*)    HCT 34.6 (*)    Platelets 441 (*)    All other components within normal limits  I-STAT TROPOININ, ED    EKG  EKG Interpretation  Date/Time:  Sunday January 30 2016 20:07:10 EDT Ventricular Rate:  123 PR Interval:  130 QRS Duration: 62 QT Interval:  320 QTC Calculation: 458 R Axis:   57 Text Interpretation:  Sinus tachycardia Confirmed by [place] ([id number]) on 01/30/2016 9:35:50 PM       Radiology Dg Chest 2 View  Result Date: 01/30/2016 CLINICAL DATA:  Mid and left side chest pain for 1 week. No known injury. EXAM: CHEST  2 VIEW COMPARISON:  PA and lateral chest 01/27/2016 and 02/25/2014. FINDINGS: The lungs are clear. Heart size is normal. No pneumothorax or effusion. No bony abnormality IMPRESSION: Negative chest. Electronically Signed   By: Inge Rise M.D.   On: 01/30/2016 20:43    Procedures Procedures (including critical care time)  Medications Ordered in ED Medications - No data to display   Initial Impression / Assessment and Plan / ED Course  I have reviewed the triage vital signs and the nursing  notes.  Pertinent labs & imaging results that were available during my care of the patient were reviewed by me and considered in my medical decision making (see chart for details).  Clinical Course    Presentation most consistent with muscle spasm. Patient was seen for the same presentation 2 days ago and ruled out for ACS. EKG today without any acute ischemic changes. Chest x-ray without evidence suggestive of pneumonia, pneumothorax, pneumomediastinum.  No abnormal contour of the mediastinum to suggest dissection. No evidence of acute injuries. initial troponin obtained by triage was negative. Other labs revealed a known hypokalemia which patient's already treating. Low suspicion for pulmonary embolism. Presentation is classic for dissection or esophageal perforation.  In addition patient has anxiety, but has not currently in any distress. No SI, HI or AVH. Patient reported that she was recently treated for anxiety however she was not able to tolerate the medication well and has not been on anything since. No indication for initiation of medication at this time. However I did recommend initiation of medication.  The patient safe for discharge with strict return precautions. Patient follow-up with primary care provider.  Final Clinical Impressions(s) / ED Diagnoses   Final diagnoses:  Anxiety  Muscle spasm of back   Disposition: Discharge  Condition: Good  I have discussed the results, Dx and Tx plan with the patient who expressed understanding and agree(s) with the plan. Discharge instructions discussed at great length. The patient was given strict return precautions who verbalized understanding of the instructions. No further questions at time of discharge.    Discharge Medication List as of 01/30/2016 10:17 PM      Follow Up: Kelton Pillar, MD [place] [address] [place] [address] [phone]  Call  As needed       Fatima Blank, MD 01/31/16  [phone]

## 2016-01-30 NOTE — ED Notes (Signed)
Patient is alert and orientedx4.  Patient was explained discharge instructions and they understood them with no questions.   

## 2016-01-30 NOTE — ED Notes (Signed)
I provided the patient with discharge instructions and asked her to read them so she could understand.  When asked if she understood the discharge instructions she said no.  I offered to get the MD to explain whatever it is she does not understand and she said, how can he understand that I am stressed from these two visits?" I asked if she read her discharge instructions and she said no.  I asked if she could read them so that maybe we could answer questions for her and she said no.  Patient did request two work notes, one for today and one for Thursday when she was seen here for the same complaint.  Both notes were provided to her.  No further action taken.

## 2016-01-30 NOTE — ED Triage Notes (Signed)
Seen a few days ago for CP and palpitations. Told to come back if CP returned; it has been intermittent until today. All day has had constant left side chest pain that radiates to back. Denies N/V, diaphoresis, lightheadedness or SOB.

## 2016-03-03 ENCOUNTER — Encounter: Payer: Self-pay | Admitting: Internal Medicine

## 2016-03-03 ENCOUNTER — Encounter (INDEPENDENT_AMBULATORY_CARE_PROVIDER_SITE_OTHER): Payer: Self-pay

## 2016-03-03 ENCOUNTER — Other Ambulatory Visit: Payer: Self-pay | Admitting: *Deleted

## 2016-03-03 ENCOUNTER — Ambulatory Visit (INDEPENDENT_AMBULATORY_CARE_PROVIDER_SITE_OTHER): Payer: Managed Care, Other (non HMO) | Admitting: Internal Medicine

## 2016-03-03 VITALS — BP 114/64 | HR 73 | Ht 62.0 in | Wt 161.0 lb

## 2016-03-03 DIAGNOSIS — D509 Iron deficiency anemia, unspecified: Secondary | ICD-10-CM | POA: Diagnosis not present

## 2016-03-03 DIAGNOSIS — R0789 Other chest pain: Secondary | ICD-10-CM | POA: Diagnosis not present

## 2016-03-03 DIAGNOSIS — R002 Palpitations: Secondary | ICD-10-CM | POA: Insufficient documentation

## 2016-03-03 NOTE — Progress Notes (Signed)
New Outpatient Visit Date: 03/03/2016  Referring Provider: Kelton Pillar, MD [address] [place], [place] [address]  Chief Complaint: Chest pain and palpitations  HPI:  Caitlyn Roy is a 30 y.o. year-old female with history of orbital pseudotumor, iron deficiency anemia, and anxiety, who has been referred by Dr. Laurann Montana for evaluation of palpitations and chest pain. She reports multiple episodes of a rapid and forceful heartbeat. These often occur at night or when she first stands up out of bed or from the couch. The episodes last several minutes, and in the past, have progressed into an anxiety attack with accompanying shortness of breath, chest pressure, and sensation that her throat was closing up. She was started on sertraline about 2 weeks ago and has noticed a marked improvement in her anxiety with accompanying chest pressure and dyspnea. The chest pressure would sometimes lasts days at the time of the maximal intensity of 7-8/10. It would not radiate and was associated only with episodic dyspnea. It has not been present since initiation of sertraline. However, the patient continues to have rapid heart beats that last up to 10 minutes on a daily basis. She does not feel lightheaded nor has she passed out. She denies extreme lower extremity edema and orthopnea but has occasional PND. The patient denies exertional chest pain and shortness of breath. The patient denies prior cardiovascular disease and testing. However, she notes that her mom is a history of hypertension and a possible heart attack in her 68s.  --------------------------------------------------------------------------------------------------  Cardiovascular History & Procedures: Cardiovascular Problems:  None  Risk Factors:  None  Cath/PCI:  None  CV Surgery:  None  EP Procedures and Devices:  None  Non-Invasive Evaluation(s):  None  Recent CV Pertinent Labs: Lab Results  Component Value  Date   K 3.3 (L) 01/30/2016   K 3.1 (L) 10/17/2013   BUN 8 01/30/2016   BUN 8 10/17/2013   CREATININE 0.83 01/30/2016   CREATININE 0.88 10/17/2013    --------------------------------------------------------------------------------------------------  Past Medical History:  Diagnosis Date  . Anxiety   . 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 Tubal pregnancy   . Vaginal odor   . Vaginitis     Past Surgical History:  Procedure Laterality Date  . BIOPSY EYE MUSCLE    . CESAREAN SECTION  2010    Outpatient Encounter Prescriptions as of 03/03/2016  Medication Sig  . iron polysaccharides (NIFEREX) 150 MG capsule Take 150 mg by mouth daily.  . sertraline (ZOLOFT) 50 MG tablet Take 50 mg by mouth daily.  . [DISCONTINUED] penicillin v potassium (VEETID) 500 MG tablet Take 1 tablet (500 mg total) by mouth 3 (three) times daily.  . [DISCONTINUED] potassium chloride SA (K-DUR,KLOR-CON) 20 MEQ tablet Take 1 tablet (20 mEq total) by mouth daily. (Patient not taking: Reported on 01/30/2016)   No facility-administered encounter medications on file as of 03/03/2016.     Allergies: Oxycodone  Social History   Social History  . Marital status: Single    Spouse name: N/A  . Number of children: 1  . Years of education: college    Occupational History  . Not on file.   Social History Main Topics  . Smoking status: Never Smoker  . Smokeless tobacco: Never Used  . Alcohol use 1.2 oz/week    2 Glasses of wine per week     Comment: occ  . Drug use: No  . Sexual activity:  Yes    Birth control/ protection: Condom   Other Topics Concern  . Not on file   Social History Narrative   Lives at home with son   Drinks no caffeine     Family History  Problem Relation Age of Onset  . Hypertension Mother   . Breast cancer Mother   . Heart attack Mother   . Alcohol abuse Father     Review of Systems: Review of Systems    Constitutional: Negative.   Eyes: Positive for blurred vision (Similar to when pseudotumor was diagnosed 3-4 years ago.  Has appointment to see her neurologist next week.).  Respiratory: Positive for shortness of breath (with anxiety attacks).   Cardiovascular: Positive for chest pain (with anxiety attacks), palpitations and PND. Negative for orthopnea, claudication and leg swelling.  Gastrointestinal: Positive for heartburn.  Genitourinary: Negative.   Musculoskeletal: Negative.   Skin: Negative.   Neurological: Positive for headaches.  Endo/Heme/Allergies: Does not bruise/bleed easily (Reports chronic anemia since childhood but does not know why.  Has been taking iron supplements for many years.).  Psychiatric/Behavioral: Positive for depression. The patient is nervous/anxious.    --------------------------------------------------------------------------------------------------  Physical Exam: BP 114/64   Pulse 73   Ht 5\' 2"  (1.575 m)   Wt 161 lb (73 kg)   LMP 02/18/2016   BMI 29.45 kg/m   General:  Well-developed, well-nourished woman seated comfortably in the exam room. HEENT: No conjunctival pallor or scleral icterus.  Moist mucous membranes.  OP clear. Neck: Supple without lymphadenopathy, thyromegaly, JVD, or HJR.  No carotid bruit. Lungs: Normal work of breathing.  Clear to auscultation bilaterally without wheezes or crackles. Heart: Regular rate and rhythm without murmurs, rubs, or gallops.  Non-displaced PMI. Abd: Bowel sounds present.  Soft, NT/ND without hepatosplenomegaly Ext: No lower extremity edema.  Radial, PT, and DP pulses are 2+ bilaterally Skin: warm and dry without rash Neuro: CNIII-XII intact.  Strength and fine-touch sensation intact in upper and lower extremities bilaterally. Psych: Flat affect with slightly pressures speech.  Normal mood.  EKG:  Sinus arrhythmia without significant abnormalities.  Lab Results  Component Value Date   WBC 7.1 01/30/2016    HGB 10.9 (L) 01/30/2016   HCT 34.6 (L) 01/30/2016   MCV 85.0 01/30/2016   PLT 441 (H) 01/30/2016    Lab Results  Component Value Date   NA 134 (L) 01/30/2016   K 3.3 (L) 01/30/2016   CL 101 01/30/2016   CO2 21 (L) 01/30/2016   BUN 8 01/30/2016   CREATININE 0.83 01/30/2016   GLUCOSE 103 (H) 01/30/2016   ALT 18 09/04/2015   Lab Results  Component Value Date   TSH 2.211 01/28/2016    --------------------------------------------------------------------------------------------------  ASSESSMENT AND PLAN: 1. Palpitations The patient notices a rapid and forceful heartbeat, typically when she first gets up from the couch or from bed. She does not have worrisome symptoms including lightheadedness, syncope, and chest pain that accompany the palpitations. I have a low suspicion for significant arrhythmia. We have agreed to obtain a 48-hour Holter monitor, as her symptoms occur on an almost daily basis. I have encouraged the patient to continue avoiding caffeine and alcohol.  2. Atypical chest pain Chest pressure has improved significantly with initiation of sertraline. She does not have any exertional symptoms, and I suspect that her prior discomfort was part of her anxiety syndrome. We will not proceed with further workup unless she has recurrence of her pain given her low likelihood of atherosclerotic cardiovascular disease.  3. Iron deficiency anemia, unspecified iron deficiency anemia type Patient reports long history of iron deficiency and was noted to have a hemoglobin of 10.9 during her most recent ED visit. The patient should continue to follow with her primary care provider for further management and workup of her anemia.  Follow-up: Return to clinic in 3 months.  Nelva Bush, MD 03/03/2016 8:39 AM

## 2016-03-03 NOTE — Patient Instructions (Addendum)
Medication Instructions:  Your physician recommends that you continue on your current medications as directed. Please refer to the Current Medication list given to you today.   Labwork: None ordered  Testing/Procedures: Your physician has recommended that you wear a holter monitor. Holter monitors are medical devices that record the heart's electrical activity. Doctors most often use these monitors to diagnose arrhythmias. Arrhythmias are problems with the speed or rhythm of the heartbeat. The monitor is a small, portable device. You can wear one while you do your normal daily activities. This is usually used to diagnose what is causing palpitations/syncope (passing out).    Follow-Up: Your physician recommends that you schedule a follow-up appointment in: 3 months with Dr.End   Any Other Special Instructions Will Be Listed Below (If Applicable).     If you need a refill on your cardiac medications before your next appointment, please call your pharmacy.

## 2016-03-08 ENCOUNTER — Telehealth: Payer: Self-pay | Admitting: Internal Medicine

## 2016-03-08 NOTE — Telephone Encounter (Signed)
I spoke with patient today she requested that we cancel her Monitor appt.  Patient feels like her symptoms are better and she does not need the monitor. stpegram

## 2016-03-20 ENCOUNTER — Ambulatory Visit (INDEPENDENT_AMBULATORY_CARE_PROVIDER_SITE_OTHER): Payer: Managed Care, Other (non HMO) | Admitting: Diagnostic Neuroimaging

## 2016-03-20 ENCOUNTER — Encounter: Payer: Self-pay | Admitting: Diagnostic Neuroimaging

## 2016-03-20 VITALS — BP 110/73 | HR 94 | Wt 162.0 lb

## 2016-03-20 DIAGNOSIS — H05113 Granuloma of bilateral orbits: Secondary | ICD-10-CM | POA: Diagnosis not present

## 2016-03-20 DIAGNOSIS — H538 Other visual disturbances: Secondary | ICD-10-CM

## 2016-03-20 NOTE — Progress Notes (Signed)
[facility]  PATIENT: Caitlyn Roy DOB: Oct 26, 1985  REFERRING CLINICIAN:  HISTORY FROM: patient  REASON FOR VISIT: follow up    HISTORICAL  CHIEF COMPLAINT:  Chief Complaint  Patient presents with  . Pseudotumor    rm 7, "I think my pseudotumor is coming back. Pressure/swelling on upper eyluds when I wake up, goes away x 1 month; strong headache on left side, off and on, ongoing"  . Follow-up    HISTORY OF PRESENT ILLNESS:   UPDATE 03/20/16: Since last visit, has had recurrence of intermittent eye pressure sensation, and is concerned that orbital pseudotumor is recurring. Symptoms are recurring in the last 2 months.   UPDATE 04/21/15: Since last visit, had 1 more event of transient right sided numbness and left sided headache. No nausea, vomit, photophobia or phonophobia.  UPDATE 10/13/14: Since last visit, had 1 more episode of right hand numbness. Also has 5 days of right foot/ankle pain (no injury). Still not established with PCP. MRI and EEG were normal.  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 March 2016 patient had 5 minute episode of right lip numbness. Symptoms resolved on their own. 08/31/14 patient had 30 minute episode of right lip, cheek, face, scalp numbness. Patient went to the emergency room, was evaluated and discharged home. 09/01/14 patient had a 4 hour episode of right lip, face, arm and hand numbness. Patient had sharp pains on the right side of her head following this event. Patient was evaluated emergency room with CT scanning of the head and then discharged home. Since that time patient had no further events.  PRIOR EVENTS: - October 2015 patient had syncopal event while at work, after she had gone to the bathroom. Patient collapsed and her vital signs at that time showed  hypotension. - In 2014 patient had onset of scleral injection, swelling in eyes, blurred vision, diagnosed with orbital pseudotumor (biopsy proven), initially treated with steroids, but was unable to be weaned. She was then treated with radiation therapy to the orbits and since that time her symptoms have resolved.   REVIEW OF SYSTEMS: Full 14 system review of systems performed and notable only for numbness weakness joint pain aching muscles.   ALLERGIES: Allergies  Allergen Reactions  . Oxycodone Shortness Of Breath    HOME MEDICATIONS: Outpatient Medications Prior to Visit  Medication Sig Dispense Refill  . iron polysaccharides (NIFEREX) 150 MG capsule Take 150 mg by mouth daily.    . sertraline (ZOLOFT) 50 MG tablet Take 50 mg by mouth daily.     No facility-administered medications prior to visit.     PAST MEDICAL HISTORY: Past Medical History:  Diagnosis Date  . Anxiety   . 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 Tubal pregnancy   . Vaginal odor   . Vaginitis     PAST SURGICAL HISTORY: Past Surgical History:  Procedure Laterality Date  . BIOPSY EYE MUSCLE    . CESAREAN SECTION  2010    FAMILY HISTORY: Family History  Problem Relation Age of Onset  . Hypertension Mother   . Breast cancer Mother   . Heart attack Mother   . Alcohol abuse Father     SOCIAL HISTORY:  Social History   Social History  . Marital status: Single    Spouse name: N/A  . Number  of children: 1  . Years of education: college    Occupational History  . Not on file.   Social History Main Topics  . Smoking status: Never Smoker  . Smokeless tobacco: Never Used  . Alcohol use 1.2 oz/week    2 Glasses of wine per week     Comment: occ  . Drug use: No  . Sexual activity: Yes    Birth control/ protection: Condom   Other Topics Concern  . Not on file   Social History Narrative   Lives at home with son   Drinks no  caffeine      PHYSICAL EXAM  Vitals:   03/20/16 0801  BP: 110/73  Pulse: 94  Weight: 162 lb (73.5 kg)    Visual Acuity Screening   Right eye Left eye Both eyes  Without correction: 20/20 20/20   With correction:     Comments: 03/20/16 pt reports "ongoing blurry vision; occurs off and on"   Body mass index is 29.63 kg/m.  No exam data present  No flowsheet data found.  GENERAL EXAM: Patient is in no distress; well developed, nourished and groomed; neck is supple  CARDIOVASCULAR: Regular rate and rhythm, no murmurs, no carotid bruits  NEUROLOGIC: MENTAL STATUS: awake, alert, language fluent, comprehension intact, naming intact, fund of knowledge appropriate CRANIAL NERVE: pupils equal and reactive to light, visual fields full to confrontation, extraocular muscles intact, no nystagmus, facial sensation and strength symmetric, hearing intact, palate elevates symmetrically, uvula midline, shoulder shrug symmetric, tongue midline. MOTOR: normal bulk and tone, full strength in the BUE, BLE SENSORY: normal and symmetric to light touch COORDINATION: finger-nose-finger, fine finger movements normal REFLEXES: deep tendon reflexes present and symmetric GAIT/STATION: narrow based gait    DIAGNOSTIC DATA (LABS, IMAGING, TESTING) - I reviewed patient records, labs, notes, testing and imaging myself where available.  Lab Results  Component Value Date   WBC 7.1 01/30/2016   HGB 10.9 (L) 01/30/2016   HCT 34.6 (L) 01/30/2016   MCV 85.0 01/30/2016   PLT 441 (H) 01/30/2016      Component Value Date/Time   NA 134 (L) 01/30/2016 2009   NA 138 10/17/2013 0442   K 3.3 (L) 01/30/2016 2009   K 3.1 (L) 10/17/2013 0442   CL 101 01/30/2016 2009   CL 107 10/17/2013 0442   CO2 21 (L) 01/30/2016 2009   CO2 25 10/17/2013 0442   GLUCOSE 103 (H) 01/30/2016 2009   GLUCOSE 81 10/17/2013 0442   BUN 8 01/30/2016 2009   BUN 8 10/17/2013 0442   CREATININE 0.83 01/30/2016 2009   CREATININE  0.88 10/17/2013 0442   CALCIUM 8.8 (L) 01/30/2016 2009   CALCIUM 8.2 (L) 10/17/2013 0442   PROT 7.3 09/04/2015 0038   ALBUMIN 3.8 09/04/2015 0038   AST 20 09/04/2015 0038   ALT 18 09/04/2015 0038   ALKPHOS 80 09/04/2015 0038   BILITOT 0.3 09/04/2015 0038   GFRNONAA >60 01/30/2016 2009   GFRNONAA >60 10/17/2013 0442   GFRAA >60 01/30/2016 2009   GFRAA >60 10/17/2013 0442   No results found for: CHOL, HDL, LDLCALC, LDLDIRECT, TRIG, CHOLHDL No results found for: HGBA1C No results found for: VITAMINB12 Lab Results  Component Value Date   TSH 2.211 01/28/2016     02/25/14 CT head 1. No acute displaced skull fractures or acute intracranial abnormalities. 2. The appearance of the brain is normal.  09/01/14 CT head 1. Normal CT appearance of the brain. 2. Mild paranasal sinus disease, predominantly  within the ethmoid air cells.  09/09/14 MRI brain - normal; the study was compared to an MRI of the orbits from 04/03/2014. There is been no interval change.  09/09/14 EEG - normal    ASSESSMENT AND PLAN  30 y.o. year old female here with transient right face and arm numbness since March 2016, sometimes associated with left sided headaches.   Ddx eye pressure: orbital pseudotumor vs other primary eye related issue  PLAN: - check MRI brain and orbits - refer to ophthalmology for evaluation  Orders Placed This Encounter  Procedures  . MR BRAIN W WO CONTRAST  . MR ORBITS W WO CONTRAST  . Ambulatory referral to Ophthalmology   Return in about 3 months (around 06/20/2016).    Penni Bombard, MD [id number], [id number] AM Certified in Neurology, Neurophysiology and Neuroimaging  [facility] [address], [place], [place] [address] [phone]

## 2016-03-25 ENCOUNTER — Encounter (HOSPITAL_COMMUNITY): Payer: Self-pay | Admitting: *Deleted

## 2016-03-25 ENCOUNTER — Inpatient Hospital Stay (HOSPITAL_COMMUNITY)
Admission: AD | Admit: 2016-03-25 | Discharge: 2016-03-25 | Disposition: A | Discharge status: Home / Self Care | Payer: Managed Care, Other (non HMO) | Source: Ambulatory Visit | Attending: Obstetrics & Gynecology | Admitting: Obstetrics & Gynecology

## 2016-03-25 DIAGNOSIS — F419 Anxiety disorder, unspecified: Secondary | ICD-10-CM | POA: Insufficient documentation

## 2016-03-25 DIAGNOSIS — N760 Acute vaginitis: Secondary | ICD-10-CM | POA: Diagnosis not present

## 2016-03-25 DIAGNOSIS — B9689 Other specified bacterial agents as the cause of diseases classified elsewhere: Secondary | ICD-10-CM | POA: Diagnosis not present

## 2016-03-25 DIAGNOSIS — R102 Pelvic and perineal pain: Secondary | ICD-10-CM | POA: Insufficient documentation

## 2016-03-25 LAB — URINE MICROSCOPIC-ADD ON

## 2016-03-25 LAB — WET PREP, GENITAL
Sperm: NONE SEEN
Trich, Wet Prep: NONE SEEN
Yeast Wet Prep HPF POC: NONE SEEN

## 2016-03-25 LAB — CBC
HCT: 32.7 % — ABNORMAL LOW (ref 36.0–46.0)
Hemoglobin: 10.8 g/dL — ABNORMAL LOW (ref 12.0–15.0)
MCH: 26.9 pg (ref 26.0–34.0)
MCHC: 33 g/dL (ref 30.0–36.0)
MCV: 81.5 fL (ref 78.0–100.0)
Platelets: 403 10*3/uL — ABNORMAL HIGH (ref 150–400)
RBC: 4.01 MIL/uL (ref 3.87–5.11)
RDW: 14.8 % (ref 11.5–15.5)
WBC: 6.2 10*3/uL (ref 4.0–10.5)

## 2016-03-25 LAB — URINALYSIS, ROUTINE W REFLEX MICROSCOPIC
Bilirubin Urine: NEGATIVE
Glucose, UA: NEGATIVE mg/dL
Ketones, ur: NEGATIVE mg/dL
Leukocytes, UA: NEGATIVE
Nitrite: NEGATIVE
Protein, ur: NEGATIVE mg/dL
Specific Gravity, Urine: >1.03 — ABNORMAL HIGH (ref 1.005–1.030)
pH: 5.5 (ref 5.0–8.0)

## 2016-03-25 LAB — RPR: RPR Ser Ql: NONREACTIVE

## 2016-03-25 LAB — HEPATITIS B SURFACE ANTIGEN: Hepatitis B Surface Ag: NEGATIVE

## 2016-03-25 LAB — HIV ANTIBODY (ROUTINE TESTING W REFLEX): HIV Screen 4th Generation wRfx: NONREACTIVE

## 2016-03-25 LAB — POCT PREGNANCY, URINE: Preg Test, Ur: NEGATIVE

## 2016-03-25 LAB — HCG, QUANTITATIVE, PREGNANCY: hCG, Beta Chain, Quant, S: <1 m[IU]/mL (ref <5)

## 2016-03-25 MED ORDER — METRONIDAZOLE 500 MG PO TABS: 500.0000 mg | ORAL_TABLET | Freq: Two times a day (BID) | ORAL | 0 refills | Status: DC

## 2016-03-25 NOTE — MAU Note (Signed)
Pelvic pain for 4 days. Sharp pain in R side for 2 days that comes and goes. Nausea and heartburn. Hx of ectopic and had surgery to remove it about 3 yrs. Denies vag bleeding or d/c, Has not done home upt

## 2016-03-25 NOTE — Progress Notes (Signed)
Hx anxiety. Stop taking med due to not liking how it made her feel. States she is doing fine now

## 2016-03-25 NOTE — Discharge Instructions (Signed)

## 2016-03-25 NOTE — Progress Notes (Signed)
Melanie Bhambri CNM in earlier to discuss test results and d/c plan. Written and verbal d/c instructions given and understanding voiced 

## 2016-03-25 NOTE — MAU Provider Note (Signed)
History     CSN: [id number]  Arrival date and time: 03/25/16 0053   None     Chief Complaint  Patient presents with  . Pelvic Pain   [id number] nonpregnant female here with intermittent lower bilateral sharp abdominal pain x4 days. She reports the pain is located in the "groin area" and "feels like my ectopic pregnancy". She has not used analgesics. She denies fever and chills. She endorses nausea and heartburn. She denies urinary sx. She reports a one time sexual encounter about 3 weeks ago and the condom broke. She also reports intermittent malodorous vaginal discharge and hx of BV.     Pertinent Gynecological History: Menses: 03/09/16 Contraception: condoms Sexually transmitted diseases: no past history Last pap: normal Date: unsure   Past Medical History:  Diagnosis Date  . Anxiety   . 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 Tubal pregnancy   . Vaginal odor   . Vaginitis     Past Surgical History:  Procedure Laterality Date  . BIOPSY EYE MUSCLE    . CESAREAN SECTION  2010  . laporoscopy      Family History  Problem Relation Age of Onset  . Hypertension Mother   . Breast cancer Mother   . Heart attack Mother   . Alcohol abuse Father     Social History  Substance Use Topics  . Smoking status: Never Smoker  . Smokeless tobacco: Never Used  . Alcohol use 1.2 oz/week    2 Glasses of wine per week     Comment: occ    Allergies:  Allergies  Allergen Reactions  . Oxycodone Shortness Of Breath    Prescriptions Prior to Admission  Medication Sig Dispense Refill Last Dose  . iron polysaccharides (NIFEREX) 150 MG capsule Take 150 mg by mouth daily.   03/25/2016 at Unknown time    Review of Systems  Constitutional: Negative.   Gastrointestinal: Positive for abdominal pain, heartburn and nausea.  Genitourinary: Negative.    Physical Exam   Blood pressure 121/79, pulse 108, temperature 98.6 F  (37 C), resp. rate 20, height 5\' 2"  (1.575 m), weight 73.6 kg (162 lb 3.2 oz), last menstrual period 03/09/2016.  Physical Exam  Constitutional: She is oriented to person, place, and time. She appears well-developed and well-nourished. No distress.  HENT:  Head: Normocephalic and atraumatic.  Neck: Normal range of motion. Neck supple.  Cardiovascular: Normal rate.   Respiratory: Effort normal.  GI: Soft. She exhibits no distension and no mass. There is tenderness (just below umbilicus). There is no rebound and no guarding.  Genitourinary:  Genitourinary Comments: External: no lesions Vagina: rugated, parous, thin white discharge Uterus: non enlarged, anteverted, non tender, no CMT Adnexae: no masses, no tenderness left, no tenderness right   Musculoskeletal: Normal range of motion.  Neurological: She is alert and oriented to person, place, and time.  Skin: Skin is warm and dry.  Psychiatric: She has a normal mood and affect.   Results for orders placed or performed during the hospital encounter of 03/25/16 (from the past 24 hour(s))  Urinalysis, Routine w reflex microscopic (not at [facility])     Status: Abnormal   Collection Time: 03/25/16  1:10 AM  Result Value Ref Range   Color, Urine YELLOW YELLOW   APPearance CLEAR CLEAR   Specific Gravity, Urine >1.030 (H) 1.005 - 1.030   pH 5.5 5.0 - 8.0   Glucose, UA NEGATIVE NEGATIVE mg/dL  Hgb urine dipstick SMALL (A) NEGATIVE   Bilirubin Urine NEGATIVE NEGATIVE   Ketones, ur NEGATIVE NEGATIVE mg/dL   Protein, ur NEGATIVE NEGATIVE mg/dL   Nitrite NEGATIVE NEGATIVE   Leukocytes, UA NEGATIVE NEGATIVE  Urine microscopic-add on     Status: Abnormal   Collection Time: 03/25/16  1:10 AM  Result Value Ref Range   Squamous Epithelial / LPF 0-5 (A) NONE SEEN   WBC, UA 0-5 0 - 5 WBC/hpf   RBC / HPF 0-5 0 - 5 RBC/hpf   Bacteria, UA FEW (A) NONE SEEN  Wet prep, genital     Status: Abnormal   Collection Time: 03/25/16  1:27 AM  Result Value Ref  Range   Yeast Wet Prep HPF POC NONE SEEN NONE SEEN   Trich, Wet Prep NONE SEEN NONE SEEN   Clue Cells Wet Prep HPF POC PRESENT (A) NONE SEEN   WBC, Wet Prep HPF POC FEW (A) NONE SEEN   Sperm NONE SEEN   Pregnancy, urine POC     Status: None   Collection Time: 03/25/16  1:32 AM  Result Value Ref Range   Preg Test, Ur NEGATIVE NEGATIVE  CBC     Status: Abnormal   Collection Time: 03/25/16  1:53 AM  Result Value Ref Range   WBC 6.2 4.0 - 10.5 K/uL   RBC 4.01 3.87 - 5.11 MIL/uL   Hemoglobin 10.8 (L) 12.0 - 15.0 g/dL   HCT 32.7 (L) 36.0 - 46.0 %   MCV 81.5 78.0 - 100.0 fL   MCH 26.9 26.0 - 34.0 pg   MCHC 33.0 30.0 - 36.0 g/dL   RDW 14.8 11.5 - 15.5 %   Platelets 403 (H) 150 - 400 K/uL  hCG, quantitative, pregnancy     Status: None   Collection Time: 03/25/16  1:53 AM  Result Value Ref Range   hCG, Beta Chain, Quant, S <1 <5 mIU/mL  Hepatitis B surface antigen     Status: None   Collection Time: 03/25/16  1:53 AM  Result Value Ref Range   Hepatitis B Surface Ag Negative Negative    MAU Course  Procedures  MDM Labs ordered and reviewed. No evidence of acute abdominal or pelvic process. Will treat for BV. GC/CT cultures pending. Stable for discharge home.   Assessment and Plan   1. Pelvic pain   2. Bacterial vaginosis    Discharge home Follow up with Gyn provider if pain persists Return to MAU for emergencies    Medication List    TAKE these medications   iron polysaccharides 150 MG capsule Commonly known as:  NIFEREX Take 150 mg by mouth daily.   metroNIDAZOLE 500 MG tablet Commonly known as:  FLAGYL Take 1 tablet (500 mg total) by mouth 2 (two) times daily.       Julianne Handler, CNM 03/25/2016, 1:37 AM

## 2016-03-27 ENCOUNTER — Other Ambulatory Visit: Payer: Self-pay | Admitting: Diagnostic Neuroimaging

## 2016-03-27 LAB — GC/CHLAMYDIA PROBE AMP (~~LOC~~) NOT AT ARMC
Chlamydia: NEGATIVE
Neisseria Gonorrhea: NEGATIVE

## 2016-04-02 ENCOUNTER — Ambulatory Visit
Admission: RE | Admit: 2016-04-02 | Discharge: 2016-04-02 | Disposition: A | Discharge status: Home / Self Care | Payer: Managed Care, Other (non HMO) | Source: Ambulatory Visit | Attending: Diagnostic Neuroimaging | Admitting: Diagnostic Neuroimaging

## 2016-04-02 DIAGNOSIS — H05113 Granuloma of bilateral orbits: Secondary | ICD-10-CM

## 2016-04-02 DIAGNOSIS — H538 Other visual disturbances: Secondary | ICD-10-CM

## 2016-04-02 MED ORDER — GADOBENATE DIMEGLUMINE 529 MG/ML IV SOLN
15.0000 mL | Freq: Once | INTRAVENOUS | Status: AC | PRN
  Administered 2016-04-02: 15 mL via INTRAVENOUS

## 2016-04-24 ENCOUNTER — Telehealth: Payer: Self-pay | Admitting: Diagnostic Neuroimaging

## 2016-04-24 NOTE — Telephone Encounter (Signed)
I called patient to follow up ophthalmology referral and MRI results. Number is disconnect. Called emergency contact, no answer. Will ask my nurse to follow up. -VRP

## 2016-05-23 ENCOUNTER — Encounter: Payer: Self-pay | Admitting: Internal Medicine

## 2016-05-23 ENCOUNTER — Telehealth: Payer: Self-pay | Admitting: Internal Medicine

## 2016-05-23 DIAGNOSIS — R002 Palpitations: Secondary | ICD-10-CM

## 2016-05-23 NOTE — Telephone Encounter (Signed)
Patient want to do the 48hr monitor that she cancel back in November, because she didn't feel like she needed it.  Now have palp again.

## 2016-05-23 NOTE — Telephone Encounter (Signed)
Attempted to call pt.  Message came on stating VM not set up.  Will try again later.

## 2016-05-23 NOTE — Telephone Encounter (Signed)
New Message     Returning your call doesn't know why you called her

## 2016-05-23 NOTE — Telephone Encounter (Signed)
This encounter was created in error - please disregard.

## 2016-05-26 NOTE — Telephone Encounter (Signed)
Pt states she is having palpitations again similar to palpitations she was having when she saw Dr End in October 2017.  Pt advised I will ask scheduler to contact her to schedule 48 hour holter monitor as ordered by Dr End in October 2017 for further evaluation of palpitations.

## 2016-06-01 ENCOUNTER — Ambulatory Visit (INDEPENDENT_AMBULATORY_CARE_PROVIDER_SITE_OTHER): Payer: Managed Care, Other (non HMO)

## 2016-06-01 ENCOUNTER — Emergency Department (HOSPITAL_COMMUNITY)
Admission: EM | Admit: 2016-06-01 | Discharge: 2016-06-01 | Disposition: A | Discharge status: Home / Self Care | Payer: Managed Care, Other (non HMO) | Attending: Emergency Medicine | Admitting: Emergency Medicine

## 2016-06-01 ENCOUNTER — Encounter (HOSPITAL_COMMUNITY): Payer: Self-pay

## 2016-06-01 ENCOUNTER — Emergency Department (HOSPITAL_COMMUNITY): Payer: Managed Care, Other (non HMO)

## 2016-06-01 ENCOUNTER — Telehealth: Payer: Self-pay | Admitting: *Deleted

## 2016-06-01 ENCOUNTER — Other Ambulatory Visit: Payer: Self-pay

## 2016-06-01 DIAGNOSIS — R002 Palpitations: Secondary | ICD-10-CM

## 2016-06-01 DIAGNOSIS — R079 Chest pain, unspecified: Secondary | ICD-10-CM | POA: Insufficient documentation

## 2016-06-01 DIAGNOSIS — Z5321 Procedure and treatment not carried out due to patient leaving prior to being seen by health care provider: Secondary | ICD-10-CM | POA: Insufficient documentation

## 2016-06-01 LAB — CBC
HCT: 33.7 % — ABNORMAL LOW (ref 36.0–46.0)
Hemoglobin: 10.8 g/dL — ABNORMAL LOW (ref 12.0–15.0)
MCH: 26.8 pg (ref 26.0–34.0)
MCHC: 32 g/dL (ref 30.0–36.0)
MCV: 83.6 fL (ref 78.0–100.0)
Platelets: 393 10*3/uL (ref 150–400)
RBC: 4.03 MIL/uL (ref 3.87–5.11)
RDW: 14.7 % (ref 11.5–15.5)
WBC: 6.8 10*3/uL (ref 4.0–10.5)

## 2016-06-01 LAB — BASIC METABOLIC PANEL
Anion gap: 12 (ref 5–15)
BUN: 11 mg/dL (ref 6–20)
CO2: 24 mmol/L (ref 22–32)
Calcium: 9.5 mg/dL (ref 8.9–10.3)
Chloride: 101 mmol/L (ref 101–111)
Creatinine, Ser: 0.79 mg/dL (ref 0.44–1.00)
GFR calc Af Amer: >60 mL/min (ref >60)
GFR calc non Af Amer: >60 mL/min (ref >60)
Glucose, Bld: 120 mg/dL — ABNORMAL HIGH (ref 65–99)
Potassium: 4 mmol/L (ref 3.5–5.1)
Sodium: 137 mmol/L (ref 135–145)

## 2016-06-01 LAB — I-STAT TROPONIN, ED: Troponin i, poc: 0 ng/mL (ref 0.00–0.08)

## 2016-06-01 LAB — D-DIMER, QUANTITATIVE: D-Dimer, Quant: <0.27 ug/mL-FEU (ref 0.00–0.50)

## 2016-06-01 NOTE — ED Notes (Signed)
Called lab to add on D dimer  

## 2016-06-01 NOTE — ED Notes (Signed)
Pt stated that she was leaving because her ride was here.

## 2016-06-01 NOTE — Telephone Encounter (Signed)
Thank you for the update. I agree with reviewing Holter results and proceeding with scheduled follow-up next week.

## 2016-06-01 NOTE — ED Triage Notes (Signed)
Pt here for cp onset 3-4 months sts has appt with [facility] in am for heart monitor, pt sts tonight more severe and waking from sleep pt is anxious in triage. Family hx of MI at 31 years of age. Anxious

## 2016-06-01 NOTE — Telephone Encounter (Signed)
PT  CAME IN TODAY  FOR  HOLTER MONITOR WHILE HERE  SPOKE  WITH  [place] RE  HAVING  HAD  CHEST  PAIN YESTERDAY    AND  WENT  TO ER  FOR EVAL  AND  TREATMENT  . AFTER REVIEWING  TEST RESULTS   ALL TESTS  APPEAR  OK . INFORMED PT   THAT  WE  NEED  TO SEE  FINDINGS  FROM HOLTER   AND  MAKE  DECISION AT THAT  TIME  WHAT IS  BEST  TREATMENT  PLAN . PT  AGREES . WILL FORWARD  TO DR  END  FOR  REVIEW. PT HAS  APPT WITH  DR  END  06-09-16  .Adonis Housekeeper

## 2016-06-04 ENCOUNTER — Inpatient Hospital Stay (EMERGENCY_DEPARTMENT_HOSPITAL)
Admission: AD | Admit: 2016-06-04 | Discharge: 2016-06-05 | Disposition: A | Discharge status: Home / Self Care | Payer: Managed Care, Other (non HMO) | Source: Ambulatory Visit | Attending: Obstetrics & Gynecology | Admitting: Obstetrics & Gynecology

## 2016-06-04 DIAGNOSIS — R309 Painful micturition, unspecified: Secondary | ICD-10-CM | POA: Diagnosis not present

## 2016-06-04 DIAGNOSIS — N938 Other specified abnormal uterine and vaginal bleeding: Secondary | ICD-10-CM | POA: Diagnosis present

## 2016-06-04 DIAGNOSIS — F419 Anxiety disorder, unspecified: Secondary | ICD-10-CM | POA: Insufficient documentation

## 2016-06-04 DIAGNOSIS — R102 Pelvic and perineal pain: Secondary | ICD-10-CM | POA: Insufficient documentation

## 2016-06-04 DIAGNOSIS — N939 Abnormal uterine and vaginal bleeding, unspecified: Secondary | ICD-10-CM | POA: Insufficient documentation

## 2016-06-04 LAB — CBC
HCT: 30.7 % — ABNORMAL LOW (ref 36.0–46.0)
Hemoglobin: 10.3 g/dL — ABNORMAL LOW (ref 12.0–15.0)
MCH: 27.6 pg (ref 26.0–34.0)
MCHC: 33.6 g/dL (ref 30.0–36.0)
MCV: 82.3 fL (ref 78.0–100.0)
Platelets: 354 10*3/uL (ref 150–400)
RBC: 3.73 MIL/uL — ABNORMAL LOW (ref 3.87–5.11)
RDW: 15 % (ref 11.5–15.5)
WBC: 6 10*3/uL (ref 4.0–10.5)

## 2016-06-04 LAB — HCG, SERUM, QUALITATIVE: Preg, Serum: NEGATIVE

## 2016-06-04 NOTE — MAU Provider Note (Signed)
History     CSN: [id number]  Arrival date and time: 06/04/16 2240   First Provider Initiated Contact with Patient 06/04/16 2300      Chief Complaint  Patient presents with  . Vaginal Bleeding   Caitlyn Roy is a 31 y.o. [id number] who presents today with vaginal bleeding, and left sided pain. She is unsure of her period in December, but states that she had bleeding 1/12-1/16. She started bleeding again on 06/03/16. She states that she is concerned about an ectopic pregnancy. She has not done a UPT at home.    Vaginal Bleeding  The patient's primary symptoms include pelvic pain and vaginal bleeding. This is a new problem. The current episode started yesterday. The problem occurs constantly. The problem has been unchanged. Pain severity now: 7/10  The problem affects the left side. Associated symptoms include nausea. Pertinent negatives include no chills, dysuria, fever, frequency, urgency or vomiting. The vaginal bleeding is typical of menses (about 4 pads in 24 hours. ). She has been passing clots (about the size of a grape. ). Nothing aggravates the symptoms. She has tried nothing for the symptoms. She is sexually active. She uses condoms for contraception. Her menstrual history has been regular.    Past Medical History:  Diagnosis Date  . Anxiety   . 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 Tubal pregnancy   . Vaginal odor   . Vaginitis     Past Surgical History:  Procedure Laterality Date  . BIOPSY EYE MUSCLE    . CESAREAN SECTION  2010  . laporoscopy      Family History  Problem Relation Age of Onset  . Hypertension Mother   . Breast cancer Mother   . Heart attack Mother   . Alcohol abuse Father     Social History  Substance Use Topics  . Smoking status: Never Smoker  . Smokeless tobacco: Never Used  . Alcohol use 1.2 oz/week    2 Glasses of wine per week     Comment: occ    Allergies:  Allergies   Allergen Reactions  . Oxycodone Shortness Of Breath    Prescriptions Prior to Admission  Medication Sig Dispense Refill Last Dose  . iron polysaccharides (NIFEREX) 150 MG capsule Take 150 mg by mouth daily.   03/25/2016 at Unknown time  . metroNIDAZOLE (FLAGYL) 500 MG tablet Take 1 tablet (500 mg total) by mouth 2 (two) times daily. 14 tablet 0     Review of Systems  Constitutional: Negative for chills and fever.  Gastrointestinal: Positive for nausea. Negative for vomiting.  Genitourinary: Positive for pelvic pain and vaginal bleeding. Negative for dysuria, frequency and urgency.   Physical Exam   Blood pressure 125/73, pulse 85, temperature 99.1 F (37.3 C), temperature source Oral, resp. rate 16, last menstrual period 05/19/2016.  Physical Exam  Nursing note and vitals reviewed. Constitutional: She is oriented to person, place, and time. She appears well-developed and well-nourished. No distress.  HENT:  Head: Normocephalic.  Cardiovascular: Normal rate.   Respiratory: Effort normal.  GI: Soft. There is no tenderness. There is no rebound.  Musculoskeletal: Normal range of motion.  Neurological: She is alert and oriented to person, place, and time.  Skin: Skin is warm and dry.  Psychiatric: She has a normal mood and affect.   Results for orders placed or performed during the hospital encounter of 06/04/16 (from the past 24 hour(s))  hCG, serum, qualitative  Status: None   Collection Time: 06/04/16 11:07 PM  Result Value Ref Range   Preg, Serum NEGATIVE NEGATIVE  CBC     Status: Abnormal   Collection Time: 06/04/16 11:07 PM  Result Value Ref Range   WBC 6.0 4.0 - 10.5 K/uL   RBC 3.73 (L) 3.87 - 5.11 MIL/uL   Hemoglobin 10.3 (L) 12.0 - 15.0 g/dL   HCT 30.7 (L) 36.0 - 46.0 %   MCV 82.3 78.0 - 100.0 fL   MCH 27.6 26.0 - 34.0 pg   MCHC 33.6 30.0 - 36.0 g/dL   RDW 15.0 11.5 - 15.5 %   Platelets 354 150 - 400 K/uL  Urinalysis, Routine w reflex microscopic     Status:  Abnormal   Collection Time: 06/05/16 12:30 AM  Result Value Ref Range   Color, Urine YELLOW YELLOW   APPearance CLEAR CLEAR   Specific Gravity, Urine 1.021 1.005 - 1.030   pH 9.0 (H) 5.0 - 8.0   Glucose, UA NEGATIVE NEGATIVE mg/dL   Hgb urine dipstick MODERATE (A) NEGATIVE   Bilirubin Urine NEGATIVE NEGATIVE   Ketones, ur NEGATIVE NEGATIVE mg/dL   Protein, ur 30 (A) NEGATIVE mg/dL   Nitrite NEGATIVE NEGATIVE   Leukocytes, UA NEGATIVE NEGATIVE   RBC / HPF TOO NUMEROUS TO COUNT 0 - 5 RBC/hpf   WBC, UA 0-5 0 - 5 WBC/hpf   Bacteria, UA NONE SEEN NONE SEEN   Squamous Epithelial / LPF 0-5 (A) NONE SEEN   Mucous PRESENT      MAU Course  Procedures  MDM Patient declines STI testing today.  Patient declines any pain medication at this time.    Assessment and Plan   1. Abnormal uterine bleeding (AUB)   2. Pelvic pain in female    DC home Comfort measures reviewed  Bleeding precautions RX: none  Return to MAU as needed FU with OB as planned  Follow-up Information    [name], MD Follow up.   Specialty:  Obstetrics and Gynecology Why:  Follow up with your appointment in February.  Contact information: [address] [place] [address] [phone]            Mathis Bud 06/04/2016, 11:01 PM

## 2016-06-04 NOTE — MAU Note (Signed)
Pt presents complaining of vaginal bleeding with pain on the left side of her abdomen. States it feels like an ectopic pregnancy. LMP 05/19/16 that she says was abnormal and started bleeding again yesterday. States she had an appt with her gyn but cancelled it because she was on her period.

## 2016-06-05 ENCOUNTER — Encounter (HOSPITAL_COMMUNITY): Payer: Self-pay

## 2016-06-05 ENCOUNTER — Other Ambulatory Visit: Payer: Self-pay

## 2016-06-05 ENCOUNTER — Encounter (HOSPITAL_COMMUNITY): Payer: Self-pay | Admitting: Emergency Medicine

## 2016-06-05 ENCOUNTER — Emergency Department (HOSPITAL_COMMUNITY)
Admission: EM | Admit: 2016-06-05 | Discharge: 2016-06-06 | Disposition: A | Discharge status: Home / Self Care | Payer: Managed Care, Other (non HMO) | Attending: Emergency Medicine | Admitting: Emergency Medicine

## 2016-06-05 DIAGNOSIS — N939 Abnormal uterine and vaginal bleeding, unspecified: Secondary | ICD-10-CM

## 2016-06-05 DIAGNOSIS — R309 Painful micturition, unspecified: Secondary | ICD-10-CM | POA: Insufficient documentation

## 2016-06-05 DIAGNOSIS — N938 Other specified abnormal uterine and vaginal bleeding: Secondary | ICD-10-CM | POA: Insufficient documentation

## 2016-06-05 DIAGNOSIS — R102 Pelvic and perineal pain: Secondary | ICD-10-CM | POA: Insufficient documentation

## 2016-06-05 LAB — URINALYSIS, ROUTINE W REFLEX MICROSCOPIC
Bacteria, UA: NONE SEEN
Bilirubin Urine: NEGATIVE
Glucose, UA: NEGATIVE mg/dL
Ketones, ur: NEGATIVE mg/dL
Leukocytes, UA: NEGATIVE
Nitrite: NEGATIVE
Protein, ur: 30 mg/dL — AB
Specific Gravity, Urine: 1.021 (ref 1.005–1.030)
pH: 9 — ABNORMAL HIGH (ref 5.0–8.0)

## 2016-06-05 LAB — HCG, QUANTITATIVE, PREGNANCY: hCG, Beta Chain, Quant, S: <1 m[IU]/mL (ref <5)

## 2016-06-05 LAB — CBC
HCT: 35 % — ABNORMAL LOW (ref 36.0–46.0)
Hemoglobin: 11.1 g/dL — ABNORMAL LOW (ref 12.0–15.0)
MCH: 26.6 pg (ref 26.0–34.0)
MCHC: 31.7 g/dL (ref 30.0–36.0)
MCV: 83.7 fL (ref 78.0–100.0)
Platelets: 407 10*3/uL — ABNORMAL HIGH (ref 150–400)
RBC: 4.18 MIL/uL (ref 3.87–5.11)
RDW: 14.4 % (ref 11.5–15.5)
WBC: 6.5 10*3/uL (ref 4.0–10.5)

## 2016-06-05 LAB — COMPREHENSIVE METABOLIC PANEL
ALT: 25 U/L (ref 14–54)
AST: 26 U/L (ref 15–41)
Albumin: 4.1 g/dL (ref 3.5–5.0)
Alkaline Phosphatase: 89 U/L (ref 38–126)
Anion gap: 8 (ref 5–15)
BUN: 8 mg/dL (ref 6–20)
CO2: 28 mmol/L (ref 22–32)
Calcium: 9.2 mg/dL (ref 8.9–10.3)
Chloride: 104 mmol/L (ref 101–111)
Creatinine, Ser: 0.8 mg/dL (ref 0.44–1.00)
GFR calc Af Amer: >60 mL/min (ref >60)
GFR calc non Af Amer: >60 mL/min (ref >60)
Glucose, Bld: 108 mg/dL — ABNORMAL HIGH (ref 65–99)
Potassium: 3.3 mmol/L — ABNORMAL LOW (ref 3.5–5.1)
Sodium: 140 mmol/L (ref 135–145)
Total Bilirubin: 0.2 mg/dL — ABNORMAL LOW (ref 0.3–1.2)
Total Protein: 7.8 g/dL (ref 6.5–8.1)

## 2016-06-05 NOTE — Discharge Instructions (Signed)
Abnormal Uterine Bleeding Abnormal uterine bleeding means bleeding from the vagina that is not your normal menstrual period. This can be:  Bleeding or spotting between periods.  Bleeding after sex (sexual intercourse).  Bleeding that is heavier or more than normal.  Periods that last longer than usual.  Bleeding after menopause. There are many problems that may cause this. Treatment will depend on the cause of the bleeding. Any kind of bleeding that is not normal should be reviewed by your doctor. Follow these instructions at home: Watch your condition for any changes. These actions may lessen any discomfort you are having:  Do not use tampons or douches as told by your doctor.  Change your pads often. You should get regular pelvic exams and Pap tests. Keep all appointments for tests as told by your doctor. Contact a doctor if:  You are bleeding for more than 1 week.  You feel dizzy at times. Get help right away if:  You pass out.  You have to change pads every 15 to 30 minutes.  You have belly pain.  You have a fever.  You become sweaty or weak.  You are passing large blood clots from the vagina.  You feel sick to your stomach (nauseous) and throw up (vomit). This information is not intended to replace advice given to you by your health care provider. Make sure you discuss any questions you have with your health care provider. Document Released: 02/19/2009 Document Revised: 09/30/2015 Document Reviewed: 11/21/2012 Elsevier Interactive Patient Education  2017 [employer].

## 2016-06-05 NOTE — ED Notes (Signed)
Located pt in waiting area, pt states that she is having more clots.

## 2016-06-05 NOTE — ED Notes (Signed)
Called pt several times in waiting area for vital, unable to locate pt

## 2016-06-05 NOTE — ED Notes (Signed)
Pt pacing around waiting area, states that she is starting to feel dizzy and wants to leave, explained to pt she should take a seat if she feels dizzy, pt refuses.

## 2016-06-05 NOTE — ED Triage Notes (Signed)
Pt reports heavy vaginal bleeding with clots and sob and lightheadedness since Saturday, pt reports LMP was 1/12, denies being pregnant.a/o.

## 2016-06-06 ENCOUNTER — Emergency Department (HOSPITAL_COMMUNITY): Payer: Managed Care, Other (non HMO)

## 2016-06-06 LAB — CBC WITH DIFFERENTIAL/PLATELET
Basophils Absolute: 0 10*3/uL (ref 0.0–0.1)
Basophils Relative: 0 %
Eosinophils Absolute: 0.2 10*3/uL (ref 0.0–0.7)
Eosinophils Relative: 3 %
HCT: 33.2 % — ABNORMAL LOW (ref 36.0–46.0)
Hemoglobin: 10.7 g/dL — ABNORMAL LOW (ref 12.0–15.0)
Lymphocytes Relative: 53 %
Lymphs Abs: 3.3 10*3/uL (ref 0.7–4.0)
MCH: 27 pg (ref 26.0–34.0)
MCHC: 32.2 g/dL (ref 30.0–36.0)
MCV: 83.6 fL (ref 78.0–100.0)
Monocytes Absolute: 0.3 10*3/uL (ref 0.1–1.0)
Monocytes Relative: 5 %
Neutro Abs: 2.4 10*3/uL (ref 1.7–7.7)
Neutrophils Relative %: 39 %
Platelets: 377 10*3/uL (ref 150–400)
RBC: 3.97 MIL/uL (ref 3.87–5.11)
RDW: 14.4 % (ref 11.5–15.5)
WBC: 6.2 10*3/uL (ref 4.0–10.5)

## 2016-06-06 LAB — URINALYSIS, ROUTINE W REFLEX MICROSCOPIC
Bacteria, UA: NONE SEEN
Bilirubin Urine: NEGATIVE
Glucose, UA: NEGATIVE mg/dL
Ketones, ur: NEGATIVE mg/dL
Leukocytes, UA: NEGATIVE
Nitrite: NEGATIVE
Protein, ur: NEGATIVE mg/dL
Specific Gravity, Urine: 1.031 — ABNORMAL HIGH (ref 1.005–1.030)
pH: 5 (ref 5.0–8.0)

## 2016-06-06 LAB — WET PREP, GENITAL
Clue Cells Wet Prep HPF POC: NONE SEEN
Sperm: NONE SEEN
Trich, Wet Prep: NONE SEEN
Yeast Wet Prep HPF POC: NONE SEEN

## 2016-06-06 LAB — GC/CHLAMYDIA PROBE AMP (~~LOC~~) NOT AT ARMC
Chlamydia: NEGATIVE
Neisseria Gonorrhea: NEGATIVE

## 2016-06-06 MED ORDER — MEDROXYPROGESTERONE ACETATE 5 MG PO TABS: 10.0000 mg | ORAL_TABLET | Freq: Every day | ORAL | 0 refills | Status: DC

## 2016-06-06 MED ORDER — MEDROXYPROGESTERONE ACETATE 10 MG PO TABS
10.0000 mg | ORAL_TABLET | Freq: Once | ORAL | Status: AC
  Administered 2016-06-06: 10 mg via ORAL
  Filled 2016-06-06: qty 1

## 2016-06-06 NOTE — ED Provider Notes (Signed)
[facility] DEPT Provider Note   CSN: [id number] Arrival date & time: 06/05/16  1722   By signing my name below, I, Eunice Blase, attest that this documentation has been prepared under the direction and in the presence of Everlene Balls, MD. Electronically signed, Eunice Blase, ED Scribe. 06/06/16. 12:49 AM.   History   Chief Complaint Chief Complaint  Patient presents with  . Vaginal Bleeding  . Shortness of Breath   HPI  HPI Comments: Caitlyn Roy is a 31 y.o. female who presents to the Emergency Department complaining of irregular vaginal bleeding x ~3-4 days. She reports associated pelvic pain, SOB, discomfort with urination, fatigue lightheadedness, back pain that has subsided, nausea and she notes "clots come out when [she urinates]". She adds her last menstrual period was 05/19/2016, and it was irregular, with more bleeding than usual. Pt denies dizziness, pregnancy, Hx of fibroids or cysts in the ovaries, abnormal vaginal discharge, bloody stool, vomiting, diarrhea or recent birth control change.   Past Medical History:  Diagnosis Date  . Anxiety   . 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 Tubal pregnancy   . Vaginal odor   . Vaginitis     Patient Active Problem List   Diagnosis Date Noted  . Iron deficiency anemia 03/03/2016  . Palpitations 03/03/2016  . BV (bacterial vaginosis) 10/15/2014    Past Surgical History:  Procedure Laterality Date  . BIOPSY EYE MUSCLE    . CESAREAN SECTION  2010  . laporoscopy      OB History    Gravida Para Term Preterm AB Living   7 1 1   6 1    SAB TAB Ectopic Multiple Live Births   5   1   1        Home Medications    Prior to Admission medications   Medication Sig Start Date End Date Taking? Authorizing Provider  metroNIDAZOLE (FLAGYL) 500 MG tablet Take 1 tablet (500 mg total) by mouth 2 (two) times daily. Patient not taking: Reported on 06/06/2016  03/25/16   Julianne Handler, CNM    Family History Family History  Problem Relation Age of Onset  . Hypertension Mother   . Breast cancer Mother   . Heart attack Mother   . Alcohol abuse Father     Social History Social History  Substance Use Topics  . Smoking status: Never Smoker  . Smokeless tobacco: Never Used  . Alcohol use 1.2 oz/week    2 Glasses of wine per week     Comment: occ     Allergies   Oxycodone   Review of Systems Review of Systems A complete 10 system review of systems was obtained and all systems are negative except as noted in the HPI and PMH.    Physical Exam Updated Vital Signs BP 118/75 (BP Location: Right Arm)   Pulse 81   Temp 98.5 F (36.9 C) (Oral)   Resp 16   Ht 5\' 2"  (1.575 m)   Wt 161 lb (73 kg)   LMP 05/19/2016 (Exact Date)   SpO2 98%   BMI 29.45 kg/m   Physical Exam  Constitutional: She is oriented to person, place, and time. She appears well-developed and well-nourished. No distress.  HENT:  Head: Normocephalic and atraumatic.  Nose: Nose normal.  Mouth/Throat: Oropharynx is clear and moist. No oropharyngeal exudate.  Eyes: Conjunctivae and EOM are normal. Pupils are equal, round, and reactive to light. No scleral  icterus.  Neck: Normal range of motion. Neck supple. No JVD present. No tracheal deviation present. No thyromegaly present.  Cardiovascular: Normal rate, regular rhythm and normal heart sounds.  Exam reveals no gallop and no friction rub.   No murmur heard. Pulmonary/Chest: Effort normal and breath sounds normal. No respiratory distress. She has no wheezes. She exhibits no tenderness.  Abdominal: Soft. Bowel sounds are normal. She exhibits no distension and no mass. There is no tenderness. There is no rebound and no guarding.  Genitourinary:  Genitourinary Comments: Mild blood seen in vaginal canal, no DC, no CMT or adnexal tenderness.  Musculoskeletal: Normal range of motion. She exhibits no edema or tenderness.    Lymphadenopathy:    She has no cervical adenopathy.  Neurological: She is alert and oriented to person, place, and time. No cranial nerve deficit. She exhibits normal muscle tone.  Skin: Skin is warm and dry. No rash noted. No erythema. No pallor.  Nursing note and vitals reviewed.    ED Treatments / Results  DIAGNOSTIC STUDIES: Oxygen Saturation is 98% on RA, normal by my interpretation.    COORDINATION OF CARE: 12:32 AM Discussed treatment plan with pt at bedside and pt agreed to plan. Will check UA and labs and perform pelvic exam, then reassess.  Labs (all labs ordered are listed, but only abnormal results are displayed) Labs Reviewed  WET PREP, GENITAL - Abnormal; Notable for the following:       Result Value   WBC, Wet Prep HPF POC FEW (*)    All other components within normal limits  COMPREHENSIVE METABOLIC PANEL - Abnormal; Notable for the following:    Potassium 3.3 (*)    Glucose, Bld 108 (*)    Total Bilirubin 0.2 (*)    All other components within normal limits  CBC - Abnormal; Notable for the following:    Hemoglobin 11.1 (*)    HCT 35.0 (*)    Platelets 407 (*)    All other components within normal limits  URINALYSIS, ROUTINE W REFLEX MICROSCOPIC - Abnormal; Notable for the following:    Specific Gravity, Urine 1.031 (*)    Hgb urine dipstick SMALL (*)    Squamous Epithelial / LPF 0-5 (*)    All other components within normal limits  CBC WITH DIFFERENTIAL/PLATELET - Abnormal; Notable for the following:    Hemoglobin 10.7 (*)    HCT 33.2 (*)    All other components within normal limits  URINE CULTURE  HCG, QUANTITATIVE, PREGNANCY  GC/CHLAMYDIA PROBE AMP ([facility]) NOT AT [facility]    EKG  EKG Interpretation  Date/Time:  Monday June 05 2016 17:34:42 EST Ventricular Rate:  105 PR Interval:  126 QRS Duration: 62 QT Interval:  330 QTC Calculation: 436 R Axis:   78 Text Interpretation:  Sinus tachycardia Otherwise normal ECG No significant change  since last tracing Confirmed by Glynn Octave [phone]) on 06/06/2016 12:26:53 AM       Radiology US Transvaginal Non-ob  Result Date: 06/06/2016 CLINICAL DATA:  Irregular vaginal bleeding for several days. EXAM: TRANSABDOMINAL AND TRANSVAGINAL ULTRASOUND OF PELVIS TECHNIQUE: Both transabdominal and transvaginal ultrasound examinations of the pelvis were performed. Transabdominal technique was performed for global imaging of the pelvis including uterus, ovaries, adnexal regions, and pelvic cul-de-sac. It was necessary to proceed with endovaginal exam following the transabdominal exam to visualize the ovaries. COMPARISON:  None FINDINGS: Uterus Measurements: 10.5 x 4.3 x 6.6 cm. No fibroids or other mass visualized. Endometrium Thickness: 3 mm.  No focal abnormality visualized. Right ovary Measurements: 2.9 x 2.3 x 2.0 cm. Normal appearance/no adnexal mass. Left ovary Measurements: 2.1 x 1.8 x 1.8 cm. Normal appearance/no adnexal mass. Other findings No abnormal free fluid. IMPRESSION: Normal uterus and ovaries.  No abnormal pelvic fluid collections. Electronically Signed   By: Andreas Newport M.D.   On: 06/06/2016 02:00   US Pelvis Complete  Result Date: 06/06/2016 CLINICAL DATA:  Irregular vaginal bleeding for several days. EXAM: TRANSABDOMINAL AND TRANSVAGINAL ULTRASOUND OF PELVIS TECHNIQUE: Both transabdominal and transvaginal ultrasound examinations of the pelvis were performed. Transabdominal technique was performed for global imaging of the pelvis including uterus, ovaries, adnexal regions, and pelvic cul-de-sac. It was necessary to proceed with endovaginal exam following the transabdominal exam to visualize the ovaries. COMPARISON:  None FINDINGS: Uterus Measurements: 10.5 x 4.3 x 6.6 cm. No fibroids or other mass visualized. Endometrium Thickness: 3 mm.  No focal abnormality visualized. Right ovary Measurements: 2.9 x 2.3 x 2.0 cm. Normal appearance/no adnexal mass. Left ovary  Measurements: 2.1 x 1.8 x 1.8 cm. Normal appearance/no adnexal mass. Other findings No abnormal free fluid. IMPRESSION: Normal uterus and ovaries.  No abnormal pelvic fluid collections. Electronically Signed   By: Andreas Newport M.D.   On: 06/06/2016 02:00    Procedures Procedures (including critical care time)  Medications Ordered in ED Medications - No data to display   Initial Impression / Assessment and Plan / ED Course  I have reviewed the triage vital signs and the nursing notes.  Pertinent labs & imaging results that were available during my care of the patient were reviewed by me and considered in my medical decision making (see chart for details).    Patient presents to the ED for vaginal bleeding that is irregular and unusual for her.  Pelvic exam was unremarkable, will obtain TVUS as well.  Repeat CBC is pending to assess for speed of the bleed.    2:26 AM [place] neg for a cause.  Repeat hgb is 0.4 lower after 9 hours of being in the ED.  This is within normal range and does not represent an acute bleed.  Will give progesteron to help stop the bleeding.  She has GYN appt in March, advised to call for sooner appt.  She appears well and in NAD. VS remainw tihin her normal limits and she is safe for DC.  I personally performed the services described in this documentation, which was scribed in my presence. The recorded information has been reviewed and is accurate.     Final Clinical Impressions(s) / ED Diagnoses   Final diagnoses:  Vaginal bleeding    New Prescriptions New Prescriptions   No medications on file     Everlene Balls, MD 06/06/16 [phone]

## 2016-06-07 LAB — URINE CULTURE: Culture: <10000 — AB

## 2016-06-08 ENCOUNTER — Encounter: Payer: 59 | Admitting: Obstetrics and Gynecology

## 2016-06-09 ENCOUNTER — Ambulatory Visit (INDEPENDENT_AMBULATORY_CARE_PROVIDER_SITE_OTHER): Payer: Managed Care, Other (non HMO) | Admitting: Internal Medicine

## 2016-06-09 ENCOUNTER — Encounter: Payer: Self-pay | Admitting: Internal Medicine

## 2016-06-09 VITALS — BP 122/68 | HR 76 | Ht 62.0 in | Wt 162.0 lb

## 2016-06-09 DIAGNOSIS — R002 Palpitations: Secondary | ICD-10-CM

## 2016-06-09 DIAGNOSIS — R0789 Other chest pain: Secondary | ICD-10-CM

## 2016-06-09 NOTE — Patient Instructions (Signed)
Medication Instructions:  Your physician recommends that you continue on your current medications as directed. Please refer to the Current Medication list given to you today.   Labwork: None   Testing/Procedures: Your physician has requested that you have an exercise tolerance test. For further information please visit [email or web address]. Please also follow instruction sheet, as given.    Follow-Up: Your physician recommends that you schedule a follow-up appointment in: 4- 6 weeks with Dr End.        If you need a refill on your cardiac medications before your next appointment, please call your pharmacy.   

## 2016-06-09 NOTE — Progress Notes (Signed)
Follow-up Outpatient Visit Date: 06/09/2016  Primary Care Provider: Osborne Casco, MD [address] [place] [address]  Chief Complaint: Follow-up palpitations and chest pain  HPI:  Caitlyn Roy is a 31 y.o. year-old female with history of orbital pseudotumor, iron deficiency anemia, and anxiety, who presents for follow-up of palpitations and chest pain. I last saw the patient on 03/03/16, at which time she was most concerned about forceful heart beats occurring when she stands or sits up. She had also experienced intermittent episodes of chest pain and shortness of breath, though these had improved significantly with the addition of sertraline. We agreed to obtain a 48 hour Holter monitor, which showed rare PACs and two brief runs of SVT (lasting 4 beats).  Today, the patient reports that she has continued to have occasional chest pain and palpitations. She had one episode of left upper chest pain just below the collar bone that lasts several hours and waxed and waned in intensity about a week ago. She presented to the ER for evaluation of left before being evaluated. She has also had 3 other [facility] for pelvic pain and vaginal bleeding. The patient is unable to further characterize her chest pain, stating simply that "it hurt." She does not recall any exacerbating or alleviating factors, though she notes a gassy feeling in her chest as well. The discomfort was not positional or related to any activity. It has not recurred since 06/01/16.  While wearing the 48 hour event monitor last week, the patient had occasional palpitations. She had a sensation of her heart being "weak" at 10:24 AM on 06/01/16. There are no recorded events corresponding to this. Her monitor showed occasional PACs. An episode of sinus tachycardia at night was also noted, though the patient states that she was lying in bed. She does not drink caffeine. She consumes 2-3 glasses of wine per  week. The patient denies lightheadedness, orthopnea, PND, and edema.  --------------------------------------------------------------------------------------------------  Cardiovascular History & Procedures: Cardiovascular Problems:  Palpitations  Atypical chest pain  Risk Factors:  None  Cath/PCI:  None  CV Surgery:  None  EP Procedures and Devices:  48-hour Holter monitor: Predominantly sinus rhythm (average rate 93 bpm) with rare PACs and two runs of SVT lasting up to 4 beats. No ventricular ectopy, sustained arrhythmias, or prolonged pauses. Diary events correspond to normal sinus rhythm and sinus arrhythmia.  Non-Invasive Evaluation(s):  None  Recent CV Pertinent Labs: Lab Results  Component Value Date   K 3.3 (L) 06/05/2016   K 3.1 (L) 10/17/2013   BUN 8 06/05/2016   BUN 8 10/17/2013   CREATININE 0.80 06/05/2016   CREATININE 0.88 10/17/2013    Past medical and surgical history were reviewed and updated in EPIC.  Outpatient Encounter Prescriptions as of 06/09/2016  Medication Sig  . IRON PO Take 1 tablet by mouth daily.  . [DISCONTINUED] medroxyPROGESTERone (PROVERA) 5 MG tablet Take 2 tablets (10 mg total) by mouth daily. (Patient not taking: Reported on 06/09/2016)   No facility-administered encounter medications on file as of 06/09/2016.     Allergies: Oxycodone  Social History   Social History  . Marital status: Single    Spouse name: N/A  . Number of children: 1  . Years of education: college    Occupational History  . Not on file.   Social History Main Topics  . Smoking status: Never Smoker  . Smokeless tobacco: Never Used  . Alcohol use 1.2 oz/week  2 Glasses of wine per week     Comment: occ  . Drug use: No  . Sexual activity: Yes    Birth control/ protection: Condom   Other Topics Concern  . Not on file   Social History Narrative   Lives at home with son   Drinks no caffeine     Family History  Problem Relation Age of Onset    . Hypertension Mother   . Breast cancer Mother   . Heart attack Mother   . Alcohol abuse Father     Review of Systems: A 12-system review of systems was performed and was negative except as noted in the HPI.  --------------------------------------------------------------------------------------------------  Physical Exam: BP 122/68   Pulse 76   Ht 5\' 2"  (1.575 m)   Wt 162 lb (73.5 kg)   LMP 05/19/2016 (Exact Date)   BMI 29.63 kg/m   General:  Overweight woman seated comfortably in the exam room. HEENT: No conjunctival pallor or scleral icterus.  Moist mucous membranes.  OP clear. Neck: Supple without lymphadenopathy, thyromegaly, JVD, or HJR.  No carotid bruit. Lungs: Normal work of breathing.  Clear to auscultation bilaterally without wheezes or crackles. Heart: Regular rate and rhythm without murmurs, rubs, or gallops.  Non-displaced PMI. Abd: Bowel sounds present.  Soft, NT/ND without hepatosplenomegaly Ext: No lower extremity edema.  Radial, PT, and DP pulses are 2+ bilaterally. Skin: warm and dry without rash  EKG (06/05/16):  I have personally reviewed the tracing. Sinus tachycardia without significant abnormalities.  Lab Results  Component Value Date   WBC 6.2 06/06/2016   HGB 10.7 (L) 06/06/2016   HCT 33.2 (L) 06/06/2016   MCV 83.6 06/06/2016   PLT 377 06/06/2016    Lab Results  Component Value Date   NA 140 06/05/2016   K 3.3 (L) 06/05/2016   CL 104 06/05/2016   CO2 28 06/05/2016   BUN 8 06/05/2016   CREATININE 0.80 06/05/2016   GLUCOSE 108 (H) 06/05/2016   ALT 25 06/05/2016   Lab Results  Component Value Date   TSH 2.211 01/28/2016   --------------------------------------------------------------------------------------------------  ASSESSMENT AND PLAN: Chest pain Patient reports an episode of difficult to describe discomfort involving the left upper chest prompting to seek evaluation in the ED. However, she left before being seen. She has  continued to have occasional vague chest discomfort, sometimes in the setting of palpitations. Recent EKG in the emergency department was notable only for sinus tachycardia. D-dimer at the time of her chest pain was negative. She has a low risk for obstructive atherosclerotic coronary artery disease. However, given her recurrent symptoms we have agreed to proceed with an exercise tolerance test.  Palpitations Patient continues to have occasional palpitations. Holter monitor showed PACs and short episodes of SVT. Fastest heart rate occurred around 10:30 PM, at which time the patient says she was lying in bed and not exerting herself. Though review of the strips is consistent with sinus tachycardia, atrial tachycardia cannot be excluded. I offered the patient appeared trial of low-dose beta blocker to see if this helps her symptoms, though she declined. We will proceed with the aforementioned ETT to see if she has any exercise-induced arrhythmias.  Follow-up: Return to clinic in 4-6 weeks.  Nelva Bush, MD 06/09/2016 8:32 AM

## 2016-06-20 ENCOUNTER — Ambulatory Visit: Payer: Managed Care, Other (non HMO) | Admitting: Diagnostic Neuroimaging

## 2016-07-03 ENCOUNTER — Telehealth: Payer: Self-pay | Admitting: Diagnostic Neuroimaging

## 2016-07-03 NOTE — Telephone Encounter (Signed)
Spoke with patient who stated she "has had this same thing in the past". She stated swelling of her eyes is bad in morning, but as she wakes up it goes down. She stated "her head feels like its not right".  When she has gone to ED in past they have given her Benadryl, nothing further was done. She stated she has not taken Benadryl due to drowsiness in the daytime while she is at work. She has not seen [facility] as referred in Nov, stated she has called them this morning, and is waiting to hear back if she can be seen today.  She stated is Dr Leta Baptist has cancellation today, she would like to be seen.  Informed her that at this time, his schedule is full. Advised her this RN will discuss with Dr Leta Baptist and call her this afternoon. She verbalized understanding, appreciation.

## 2016-07-03 NOTE — Telephone Encounter (Signed)
Spoke with patient and advised her there are two openings Wed. She requested to be scheduled for earlier FU Wed at 8 am. She will call back after she hears from the eye dr.  She verbalized understanding, appreciation.

## 2016-07-03 NOTE — Telephone Encounter (Signed)
Spoke with patient and informed her that Dr Leta Baptist does not need to see her tomorrow, but will refer her to [facility] is ophthalmologist has no further treatments for her. She stated she understood and would call back after her eye appt today.

## 2016-07-03 NOTE — Telephone Encounter (Signed)
I called patient. She has appt today with [facility]. If they do not have any other suggestions for treatment of opbital pseudotumor, then I will setup referral to [facility]. Patient is intolerant of prednisone and has received radiation treatment to the eyes in the past (at [facility]; [place]).   Penni Bombard, MD [id number], [id number] PM Certified in Neurology, Neurophysiology and Neuroimaging  [facility] [address], [place], [place] [address] [phone]

## 2016-07-03 NOTE — Telephone Encounter (Signed)
Patient stating for the past 3 days having problems with orbital pseudotumor. Her eyes are swollen, vision is blurred, headache and feels like throat is closing. She says ED cannot help.

## 2016-07-05 ENCOUNTER — Ambulatory Visit: Payer: Self-pay | Admitting: Diagnostic Neuroimaging

## 2016-07-05 NOTE — Telephone Encounter (Signed)
Spoke with patient who stated eye dr told her there "was nothing wrong with her eyes". She stated "I already knew that."  This RN asked if she would like Dr Leta Baptist to put in referral to [facility]. She stated she will wait to see if her problems get worse. If so, she will call this office and request the referral. She verbalized understanding, appreciation of call.

## 2016-07-07 ENCOUNTER — Emergency Department (HOSPITAL_COMMUNITY): Payer: Managed Care, Other (non HMO)

## 2016-07-07 ENCOUNTER — Emergency Department (HOSPITAL_COMMUNITY)
Admission: EM | Admit: 2016-07-07 | Discharge: 2016-07-08 | Disposition: A | Discharge status: Home / Self Care | Payer: Managed Care, Other (non HMO) | Attending: Emergency Medicine | Admitting: Emergency Medicine

## 2016-07-07 ENCOUNTER — Encounter (HOSPITAL_COMMUNITY): Payer: Self-pay | Admitting: Emergency Medicine

## 2016-07-07 DIAGNOSIS — R079 Chest pain, unspecified: Secondary | ICD-10-CM | POA: Diagnosis not present

## 2016-07-07 DIAGNOSIS — M5412 Radiculopathy, cervical region: Secondary | ICD-10-CM | POA: Diagnosis not present

## 2016-07-07 DIAGNOSIS — G43909 Migraine, unspecified, not intractable, without status migrainosus: Secondary | ICD-10-CM | POA: Diagnosis not present

## 2016-07-07 DIAGNOSIS — R51 Headache: Secondary | ICD-10-CM | POA: Diagnosis present

## 2016-07-07 LAB — CBC
HCT: 33.7 % — ABNORMAL LOW (ref 36.0–46.0)
Hemoglobin: 11.1 g/dL — ABNORMAL LOW (ref 12.0–15.0)
MCH: 26.6 pg (ref 26.0–34.0)
MCHC: 32.9 g/dL (ref 30.0–36.0)
MCV: 80.8 fL (ref 78.0–100.0)
Platelets: 377 10*3/uL (ref 150–400)
RBC: 4.17 MIL/uL (ref 3.87–5.11)
RDW: 14 % (ref 11.5–15.5)
WBC: 5 10*3/uL (ref 4.0–10.5)

## 2016-07-07 LAB — BASIC METABOLIC PANEL
Anion gap: 7 (ref 5–15)
BUN: 14 mg/dL (ref 6–20)
CO2: 25 mmol/L (ref 22–32)
Calcium: 8.7 mg/dL — ABNORMAL LOW (ref 8.9–10.3)
Chloride: 102 mmol/L (ref 101–111)
Creatinine, Ser: 0.79 mg/dL (ref 0.44–1.00)
GFR calc Af Amer: >60 mL/min (ref >60)
GFR calc non Af Amer: >60 mL/min (ref >60)
Glucose, Bld: 97 mg/dL (ref 65–99)
Potassium: 3.4 mmol/L — ABNORMAL LOW (ref 3.5–5.1)
Sodium: 134 mmol/L — ABNORMAL LOW (ref 135–145)

## 2016-07-07 LAB — I-STAT TROPONIN, ED: Troponin i, poc: 0.01 ng/mL (ref 0.00–0.08)

## 2016-07-07 NOTE — ED Triage Notes (Signed)
Patient has had a headache for two days. The numbness and weakness in the left arm since this morning. Patient states she is having pins and needles in the left arm also. Patient also is having chest pain that started tonight.

## 2016-07-08 NOTE — ED Provider Notes (Signed)
[facility] DEPT Provider Note   CSN: [id number] Arrival date & time: 07/07/16  2142  By signing my name below, I, Caitlyn Roy, attest that this documentation has been prepared under the direction and in the presence of Caitlyn Shanks, MD. Electronically Signed: Oleh Roy, Scribe. 07/08/16. 12:16 AM.   History   Chief Complaint Chief Complaint  Patient presents with  . Chest Pain  . Headache    HPI Caitlyn Roy is a 31 y.o. female with history of orbital pseudotumor who presents to the ED for evaluation of chest pain and headache. This patient states that in the last 3 days she has experienced a persistent L sided headache which is sharp and lancinating to focal area left parietal. At interview, she is also reporting L arm weakness with paresthesias since this afternoon. No decreased sensation. She is taking ibuprofen at home which provides transient pain relief only. When asked, she does state that she is experiencing "mild" blurred vision bilaterally which is improved compared to her chronic symptoms (associated with her orbital pseudotumor). No neck pain. No pain extending toward the face/arm. No nausea no vomiting. No gait incoordination. No fevers or chills. She also has a secondary complaint of atypical chest pain. She has been seen by a cardiologist for these complaints who did a Holter monitor study and scheduled stress test.  The history is provided by the patient. No language interpreter was used.    Past Medical History:  Diagnosis Date  . Anxiety   . 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 Tubal pregnancy   . Vaginal odor   . Vaginitis     Patient Active Problem List   Diagnosis Date Noted  . Iron deficiency anemia 03/03/2016  . Palpitations 03/03/2016  . BV (bacterial vaginosis) 10/15/2014    Past Surgical History:  Procedure Laterality Date  . BIOPSY EYE MUSCLE    . CESAREAN  SECTION  2010  . laporoscopy      OB History    Gravida Para Term Preterm AB Living   7 1 1   6 1    SAB TAB Ectopic Multiple Live Births   5   1   1        Home Medications    Prior to Admission medications   Medication Sig Start Date End Date Taking? Authorizing Provider  IRON PO Take 1 tablet by mouth daily.    Historical Provider, MD    Family History Family History  Problem Relation Age of Onset  . Hypertension Mother   . Breast cancer Mother   . Heart attack Mother   . Alcohol abuse Father     Social History Social History  Substance Use Topics  . Smoking status: Never Smoker  . Smokeless tobacco: Never Used  . Alcohol use 1.2 oz/week    2 Glasses of wine per week     Comment: occ     Allergies   Oxycodone   Review of Systems Review of Systems 10 systems reviewed and all are negative for acute change except as noted in the HPI.  Physical Exam Updated Vital Signs BP 117/81 (BP Location: Left Arm)   Pulse 96   Temp 98.4 F (36.9 C) (Oral)   Resp 18   Ht 5\' 2"  (1.575 m)   Wt 160 lb (72.6 kg)   LMP 07/02/2016   SpO2 100%   BMI 29.26 kg/m   Physical Exam  Constitutional: She is  oriented to person, place, and time.  HENT:  Mouth/Throat: Oropharynx is clear and moist. No oropharyngeal exudate or posterior oropharyngeal erythema.  Bilateral TMs clear. Posterior oropharynx is patent. Normal dentition.   Eyes: EOM are normal.  Pupils are 108mm bilaterally and reactive to light. No apparent proptosis.   Cardiovascular: Normal rate, regular rhythm and normal heart sounds.   Pulmonary/Chest: Effort normal and breath sounds normal. No respiratory distress. She has no wheezes. She exhibits no tenderness.  Abdominal: Soft. Bowel sounds are normal. There is no tenderness.  Neurological: She is alert and oriented to person, place, and time. No cranial nerve deficit. She exhibits normal muscle tone. Coordination normal.  Mental Status and cognitive function are  normal speech is clear with normal content. Symmetric grip strength, 5/5 bilaterally. Normal strength on flexion and extension in the bilateral upper extremities. Sensation equal to light tough in all four extremities. 5/5 lower extremity strength.   Skin: Skin is warm and dry.  Psychiatric: She has a normal mood and affect.     ED Treatments / Results  DIAGNOSTIC STUDIES: Oxygen Saturation is 100 percent on room air which is normal by my interpretation.    COORDINATION OF CARE: 12:19 AM On chart review, the patient did under MRI Brain in 03/2016 and CT head in 2016. I informed the patient that we will not repeat imaging today.   Labs (all labs ordered are listed, but only abnormal results are displayed) Labs Reviewed  BASIC METABOLIC PANEL - Abnormal; Notable for the following:       Result Value   Sodium 134 (*)    Potassium 3.4 (*)    Calcium 8.7 (*)    All other components within normal limits  CBC - Abnormal; Notable for the following:    Hemoglobin 11.1 (*)    HCT 33.7 (*)    All other components within normal limits  I-STAT TROPOININ, ED    EKG  EKG Interpretation  Date/Time:  Friday July 07 2016 22:10:19 EST Ventricular Rate:  99 PR Interval:    QRS Duration: 72 QT Interval:  337 QTC Calculation: 433 R Axis:   68 Text Interpretation:  Sinus rhythm Low voltage, precordial leads Baseline wander in lead(s) I III aVR aVL wandering baseline but no change from previous Confirmed by Johnney Killian, MD, Jeannie Done [phone]) on 07/08/2016 12:03:44 AM       Radiology Dg Chest 2 View  Result Date: 07/07/2016 CLINICAL DATA:  Initial evaluation for acute chest pain. EXAM: CHEST  2 VIEW COMPARISON:  None. FINDINGS: The heart size and mediastinal contours are within normal limits. Both lungs are clear. The visualized skeletal structures are unremarkable. IMPRESSION: No active cardiopulmonary disease. Electronically Signed   By: Jeannine Boga M.D.   On: 07/07/2016 22:15     Procedures Procedures (including critical care time)  Medications Ordered in ED Medications - No data to display   Initial Impression / Assessment and Plan / ED Course  I have reviewed the triage vital signs and the nursing notes.  Pertinent labs & imaging results that were available during my care of the patient were reviewed by me and considered in my medical decision making (see chart for details).       Final Clinical Impressions(s) / ED Diagnoses   Final diagnoses:  Migraine without status migrainosus, not intractable, unspecified migraine type  Nonspecific chest pain  Cervical radiculopathy   EMR review shows patient had MRI [id number]. No aneurysms or tumor present. Patient does report a  history of headaches. EMR indicates that the prior MRI was done with also associated or extremity complaints. Patient is clinically well in appearance. She has no neurologic deficits. Strong consideration is given for with associated neurologic symptoms of paresthesia. Also patient reported she awakened with a feeling of her arm paresthesia and at first thought she might of slept on it incorrectly when it persisted she discounted that consideration. Thus I also feel that radiculopathy is a possibility. However function is completely intact and I do not feel further diagnostic studies indicated at this time given prior MRI similar history of symptoms. Patient's chest pain is very atypical. She has been seen by cardiology for this and had recurrences of the same. Associated symptoms to suggest ischemia. She has done outpatient Holter monitoring and is scheduled to do outpatient stress testing. Cardiac enzymes are negative and EKG is stable compared with prior. I feel patient is stable for discharge. She is dissatisfied and feels that there is something wrong that tests are not revealing. At this time, I doubt that but have explained that I do feel neurology follow-up is appropriate for what seems  consistent with a migraine to continue her follow up with her cardiologist for further diagnostic studies as planned. She has a perception that she comes to the emergency department and never gets an answer for her symptoms. I have however reviewed results, prior studies and my clinical thought processes on her symptoms with the patient. New Prescriptions New Prescriptions   No medications on file      Caitlyn Shanks, MD 07/08/16 [phone]

## 2016-07-12 ENCOUNTER — Ambulatory Visit (INDEPENDENT_AMBULATORY_CARE_PROVIDER_SITE_OTHER): Payer: Managed Care, Other (non HMO)

## 2016-07-12 DIAGNOSIS — R002 Palpitations: Secondary | ICD-10-CM

## 2016-07-12 DIAGNOSIS — R0789 Other chest pain: Secondary | ICD-10-CM | POA: Diagnosis not present

## 2016-07-12 LAB — EXERCISE TOLERANCE TEST
Estimated workload: 7.7 METS
Exercise duration (min): 6 min
Exercise duration (sec): 30 s
MPHR: 190 {beats}/min
Peak HR: 181 {beats}/min
Percent HR: 95 %
RPE: 19
Rest HR: 96 {beats}/min

## 2016-07-13 ENCOUNTER — Encounter: Payer: Self-pay | Admitting: Internal Medicine

## 2016-07-17 ENCOUNTER — Telehealth: Payer: Self-pay | Admitting: Diagnostic Neuroimaging

## 2016-07-17 NOTE — Telephone Encounter (Signed)
Noted  

## 2016-07-17 NOTE — Telephone Encounter (Signed)
Pt called requesting to be seen today, she was advised the clinic will be closing at noon today. Pt went to Ed 07/07/16 with left arm weakness and migraine. She is concerned about the left arm weakness. Appt was scheduled for 09/04/16.

## 2016-07-18 ENCOUNTER — Ambulatory Visit: Payer: Managed Care, Other (non HMO) | Admitting: Diagnostic Neuroimaging

## 2016-07-20 ENCOUNTER — Ambulatory Visit: Payer: Managed Care, Other (non HMO) | Admitting: Internal Medicine

## 2016-07-23 ENCOUNTER — Encounter (HOSPITAL_COMMUNITY): Payer: Self-pay | Admitting: Emergency Medicine

## 2016-07-23 ENCOUNTER — Emergency Department (HOSPITAL_COMMUNITY): Payer: Managed Care, Other (non HMO)

## 2016-07-23 ENCOUNTER — Other Ambulatory Visit: Payer: Self-pay

## 2016-07-23 ENCOUNTER — Emergency Department (HOSPITAL_COMMUNITY)
Admission: EM | Admit: 2016-07-23 | Discharge: 2016-07-24 | Disposition: A | Discharge status: Home / Self Care | Payer: Managed Care, Other (non HMO) | Attending: Emergency Medicine | Admitting: Emergency Medicine

## 2016-07-23 DIAGNOSIS — D649 Anemia, unspecified: Secondary | ICD-10-CM | POA: Insufficient documentation

## 2016-07-23 DIAGNOSIS — M79605 Pain in left leg: Secondary | ICD-10-CM | POA: Diagnosis not present

## 2016-07-23 DIAGNOSIS — Z79899 Other long term (current) drug therapy: Secondary | ICD-10-CM | POA: Insufficient documentation

## 2016-07-23 DIAGNOSIS — R0789 Other chest pain: Secondary | ICD-10-CM | POA: Insufficient documentation

## 2016-07-23 DIAGNOSIS — R079 Chest pain, unspecified: Secondary | ICD-10-CM | POA: Diagnosis present

## 2016-07-23 LAB — BASIC METABOLIC PANEL
Anion gap: 8 (ref 5–15)
BUN: 9 mg/dL (ref 6–20)
CO2: 24 mmol/L (ref 22–32)
Calcium: 8.9 mg/dL (ref 8.9–10.3)
Chloride: 105 mmol/L (ref 101–111)
Creatinine, Ser: 0.79 mg/dL (ref 0.44–1.00)
GFR calc Af Amer: >60 mL/min (ref >60)
GFR calc non Af Amer: >60 mL/min (ref >60)
Glucose, Bld: 109 mg/dL — ABNORMAL HIGH (ref 65–99)
Potassium: 3.4 mmol/L — ABNORMAL LOW (ref 3.5–5.1)
Sodium: 137 mmol/L (ref 135–145)

## 2016-07-23 LAB — CBC
HCT: 34.2 % — ABNORMAL LOW (ref 36.0–46.0)
Hemoglobin: 10.8 g/dL — ABNORMAL LOW (ref 12.0–15.0)
MCH: 26.2 pg (ref 26.0–34.0)
MCHC: 31.6 g/dL (ref 30.0–36.0)
MCV: 82.8 fL (ref 78.0–100.0)
Platelets: 379 10*3/uL (ref 150–400)
RBC: 4.13 MIL/uL (ref 3.87–5.11)
RDW: 14.9 % (ref 11.5–15.5)
WBC: 5.9 10*3/uL (ref 4.0–10.5)

## 2016-07-23 LAB — I-STAT TROPONIN, ED: Troponin i, poc: 0 ng/mL (ref 0.00–0.08)

## 2016-07-23 NOTE — ED Notes (Signed)
Called to room by patient, pt states she feels short of breath. Pt appears very anxious, wringing hands, sitting on the edge of the bed. Attempts made to assure patient she was getting enough oxygen according to pulse ox of 100%. Pt ask if door could be left open at this time,door is open for patient comfort.

## 2016-07-23 NOTE — ED Provider Notes (Signed)
[facility] DEPT Provider Note   CSN: [id number] Arrival date & time: 07/23/16  2114     History   Chief Complaint Chief Complaint  Patient presents with  . Shortness of Breath  . Leg Pain    HPI Caitlyn Roy is a 31 y.o. female past medical history of orbital pseudotumor requiring steroid and radiation therapy, anxiety who presents emergency Department with chief complaint of left leg pain and chest pain. The patient has had chronic symptoms of leg pain. She complains of worsening pain in the left leg that became 7 out of 10 for her and she felt she needed evaluation in the ER tonight. The patient ALSO notices pain in her left lateral chest wall. Nothing seems to make it worse or better. She denies any recent injuries. She denies a history of blood clots. The patient has had intermittent numbness, headaches, dizziness. The numbness seems to have no specific pattern but is intermittent in all 4 extremities. Ibuprofen without relief today. She states that she is seeing a new primary care physician who thinks she may be having blood clots as a cause for symptoms. Vision is a nonsmoker. She does not use oral contraceptives. She denies hemoptysis or pleuritic chest pain.  HPI  Past Medical History:  Diagnosis Date  . Anxiety   . 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 Tubal pregnancy   . Vaginal odor   . Vaginitis     Patient Active Problem List   Diagnosis Date Noted  . Iron deficiency anemia 03/03/2016  . Palpitations 03/03/2016  . BV (bacterial vaginosis) 10/15/2014    Past Surgical History:  Procedure Laterality Date  . BIOPSY EYE MUSCLE    . CESAREAN SECTION  2010  . laporoscopy      OB History    Gravida Para Term Preterm AB Living   7 1 1   6 1    SAB TAB Ectopic Multiple Live Births   5   1   1        Home Medications    Prior to Admission medications   Medication Sig Start Date End Date  Taking? Authorizing Provider  ibuprofen (ADVIL,MOTRIN) 800 MG tablet Take 400 mg by mouth every 8 (eight) hours as needed (for headaches and/or body pain).   Yes Historical Provider, MD  iron polysaccharides (NIFEREX) 150 MG capsule Take 150 mg by mouth daily.    Historical Provider, MD    Family History Family History  Problem Relation Age of Onset  . Hypertension Mother   . Breast cancer Mother   . Heart attack Mother   . Alcohol abuse Father     Social History Social History  Substance Use Topics  . Smoking status: Never Smoker  . Smokeless tobacco: Never Used  . Alcohol use 1.2 oz/week    2 Glasses of wine per week     Comment: occ     Allergies   Oxycodone   Review of Systems Review of Systems Ten systems reviewed and are negative for acute change, except as noted in the HPI. \   Physical Exam Updated Vital Signs BP 123/81 (BP Location: Right Arm)   Pulse 98   Temp 98.6 F (37 C) (Oral)   Resp 18   Ht 5\' 2"  (1.575 m)   Wt 74.4 kg   LMP 07/02/2016   SpO2 100%   BMI 30.00 kg/m   Physical Exam  Constitutional: She is oriented  to person, place, and time. She appears well-developed and well-nourished. No distress.  HENT:  Head: Normocephalic and atraumatic.  Eyes: Conjunctivae are normal. No scleral icterus.  Neck: Normal range of motion.  Cardiovascular: Normal rate, regular rhythm, normal heart sounds and intact distal pulses.  Exam reveals no gallop and no friction rub.   No murmur heard. Leg circumference is equal bilaterally. Normal pulses. No venous dilatation or change in color.  Pulmonary/Chest: Effort normal and breath sounds normal. No respiratory distress. She exhibits tenderness (tender to palpation in the left axillary line).  Abdominal: Soft. Bowel sounds are normal. She exhibits no distension and no mass. There is no tenderness. There is no guarding.  Neurological: She is alert and oriented to person, place, and time.  Skin: Skin is warm and  dry. She is not diaphoretic.  Psychiatric:  Anxious, agitated  Nursing note and vitals reviewed.    ED Treatments / Results  Labs (all labs ordered are listed, but only abnormal results are displayed) Labs Reviewed  BASIC METABOLIC PANEL - Abnormal; Notable for the following:       Result Value   Potassium 3.4 (*)    Glucose, Bld 109 (*)    All other components within normal limits  CBC - Abnormal; Notable for the following:    Hemoglobin 10.8 (*)    HCT 34.2 (*)    All other components within normal limits  D-DIMER, QUANTITATIVE (NOT AT [facility])  Randolm Idol, ED    EKG  EKG Interpretation None       Radiology Dg Chest 2 View  Result Date: 07/23/2016 CLINICAL DATA:  31 year old female with shortness of breath. EXAM: CHEST  2 VIEW COMPARISON:  Chest radiograph dated 06/01/2016 FINDINGS: The heart size and mediastinal contours are within normal limits. Both lungs are clear. The visualized skeletal structures are unremarkable. IMPRESSION: No active cardiopulmonary disease. Electronically Signed   By: Anner Crete M.D.   On: 07/23/2016 22:37    Procedures Procedures (including critical care time)  Medications Ordered in ED Medications - No data to display   Initial Impression / Assessment and Plan / ED Course  I have reviewed the triage vital signs and the nursing notes.  Pertinent labs & imaging results that were available during my care of the patient were reviewed by me and considered in my medical decision making (see chart for details).     Patient here with chronic leg pain and chest pain. She has some associated shortness of breath tonight. Patient speaking in full sentences. She appears well. The patient felt anxious and seems defensive in our conversations stating "I have been here a bunch of times and nobody seems to know what's wrong. I am not crazy." I did reassure the patient that I am here to listen to her that I don't think she is crazy and that my  job is to make sure she doesn't have any life-threatening, limb threatening or organ threatening disorder. This. I tried also explained that my scope of practice is limited to the emergency medicine and that there are multitude of other things that could potentially be causing her symptoms, I would not be able to diagnose and in ER. Patient seemed to be somewhat reassured by this discussion. I had the patient will get a d-dimer to evaluate for potential blood clot. If this is positive, she will get a CT imaging of the chest. Patient does have reproducible chest pain with palpation of the left axillary line Final Clinical  Impressions(s) / ED Diagnoses   Final diagnoses:  Atypical chest pain  Left leg pain  Normocytic anemia   ED ECG REPORT   Date: 07/24/2016  Rate: 105  Rhythm: sinus tachycardia  QRS Axis: normal  Intervals: normal  ST/T Wave abnormalities: normal  Conduction Disutrbances:none  Narrative Interpretation:   Old EKG Reviewed: unchanged  I have personally reviewed the EKG tracing and agree with the computerized printout as noted.  HEART SCORE=0 Patient is to be discharged with recommendation to follow up with PCP in regards to today's hospital visit. Chest pain is not likely of cardiac or pulmonary etiology presentation,negative d-dimer,VSS, no tracheal deviation, no JVD or new murmur, RRR, breath sounds equal bilaterally, EKG without acute abnormalities, negative troponin, and negative CXR. Pt has been advised start a PPI and return to the ED is CP becomes exertional, associated with diaphoresis or nausea, radiates to left jaw/arm, worsens or becomes concerning in any way.  Pt appears reliable for follow up and is agreeable to discharge.   Case has been discussed with and seen by Dr. Reather Converse who agrees with the above plan to discharge.   New Prescriptions New Prescriptions   No medications on file     Margarita Mail, PA-C 07/24/16 0041    Elnora Morrison, MD 07/26/16  [phone]

## 2016-07-23 NOTE — ED Triage Notes (Signed)
Arrives with complaint of shortness of breath coupled with progressive left leg pain. Patient recently switched doctors attempting to figure out what is going on. States that she has been experiencing left lower extremity pain, fatigue, head pain, and shortness of breath intermittently for a couple months. Today the left lower extremity pain has progressed from her calf to her thigh. Also today she states that it feels difficult for her to catch her breath.

## 2016-07-24 LAB — D-DIMER, QUANTITATIVE (NOT AT ARMC): D-Dimer, Quant: 0.29 ug/mL-FEU (ref 0.00–0.50)

## 2016-07-24 NOTE — Discharge Instructions (Signed)

## 2016-08-02 ENCOUNTER — Emergency Department (HOSPITAL_COMMUNITY)
Admission: EM | Admit: 2016-08-02 | Discharge: 2016-08-02 | Disposition: A | Discharge status: Home / Self Care | Payer: Managed Care, Other (non HMO) | Attending: Emergency Medicine | Admitting: Emergency Medicine

## 2016-08-02 ENCOUNTER — Encounter (HOSPITAL_COMMUNITY): Payer: Self-pay | Admitting: Emergency Medicine

## 2016-08-02 DIAGNOSIS — R0602 Shortness of breath: Secondary | ICD-10-CM | POA: Insufficient documentation

## 2016-08-02 DIAGNOSIS — Z5321 Procedure and treatment not carried out due to patient leaving prior to being seen by health care provider: Secondary | ICD-10-CM | POA: Diagnosis not present

## 2016-08-02 NOTE — ED Triage Notes (Signed)
At completion of triage patient says her chest is not as tight. She requests to walk to [place] versus using WC as she did when she came to triage.

## 2016-08-02 NOTE — ED Provider Notes (Cosign Needed)
Started in room to see Caitlyn Roy and she is walking down the hall and states she is going home because she has an appointment with a specialist tomorrow and she has been to the ED for the same symptoms and thinks she will just wait and see the doctor tomorrow.  BP 126/67 (BP Location: Right Arm)   Pulse (!) 104   Temp 98.1 F (36.7 C) (Oral)   Ht 5\' 2"  (1.575 m)   Wt 74.4 kg   SpO2 96%   BMI 30.00 kg/m    Discussed with patient we will be glad to do an assessment but she wants to leave. Walking without difficulty and talking in full sentences.    [name], [place] 08/02/16 2130

## 2016-08-02 NOTE — ED Triage Notes (Signed)
Patient present with SOB several weeks Recent dx with Lupus, see specialist tomorrow. C/O tightness an inability to take full breath

## 2016-08-03 ENCOUNTER — Emergency Department (HOSPITAL_COMMUNITY)
Admission: EM | Admit: 2016-08-03 | Discharge: 2016-08-04 | Disposition: A | Discharge status: Home / Self Care | Payer: Managed Care, Other (non HMO) | Attending: Emergency Medicine | Admitting: Emergency Medicine

## 2016-08-03 ENCOUNTER — Other Ambulatory Visit: Payer: Self-pay

## 2016-08-03 ENCOUNTER — Encounter (HOSPITAL_COMMUNITY): Payer: Self-pay

## 2016-08-03 ENCOUNTER — Emergency Department (HOSPITAL_COMMUNITY): Payer: Managed Care, Other (non HMO)

## 2016-08-03 DIAGNOSIS — R0600 Dyspnea, unspecified: Secondary | ICD-10-CM | POA: Insufficient documentation

## 2016-08-03 LAB — CBC
HCT: 35.8 % — ABNORMAL LOW (ref 36.0–46.0)
Hemoglobin: 11.3 g/dL — ABNORMAL LOW (ref 12.0–15.0)
MCH: 26.1 pg (ref 26.0–34.0)
MCHC: 31.6 g/dL (ref 30.0–36.0)
MCV: 82.7 fL (ref 78.0–100.0)
Platelets: 411 10*3/uL — ABNORMAL HIGH (ref 150–400)
RBC: 4.33 MIL/uL (ref 3.87–5.11)
RDW: 14.5 % (ref 11.5–15.5)
WBC: 5.9 10*3/uL (ref 4.0–10.5)

## 2016-08-03 LAB — BASIC METABOLIC PANEL
Anion gap: 10 (ref 5–15)
BUN: 6 mg/dL (ref 6–20)
CO2: 24 mmol/L (ref 22–32)
Calcium: 9.2 mg/dL (ref 8.9–10.3)
Chloride: 104 mmol/L (ref 101–111)
Creatinine, Ser: 0.84 mg/dL (ref 0.44–1.00)
GFR calc Af Amer: >60 mL/min (ref >60)
GFR calc non Af Amer: >60 mL/min (ref >60)
Glucose, Bld: 87 mg/dL (ref 65–99)
Potassium: 3.7 mmol/L (ref 3.5–5.1)
Sodium: 138 mmol/L (ref 135–145)

## 2016-08-03 LAB — I-STAT TROPONIN, ED: Troponin i, poc: 0 ng/mL (ref 0.00–0.08)

## 2016-08-03 MED ORDER — ALBUTEROL SULFATE HFA 108 (90 BASE) MCG/ACT IN AERS
2.0000 | INHALATION_SPRAY | RESPIRATORY_TRACT | Status: DC | PRN
  Filled 2016-08-03: qty 6.7

## 2016-08-03 MED ORDER — IOPAMIDOL (ISOVUE-370) INJECTION 76%
INTRAVENOUS | Status: AC
  Administered 2016-08-03: 100 mL
  Filled 2016-08-03: qty 100

## 2016-08-03 MED ORDER — LORAZEPAM 1 MG PO TABS
1.0000 mg | ORAL_TABLET | Freq: Once | ORAL | Status: AC
  Administered 2016-08-03: 1 mg via ORAL
  Filled 2016-08-03: qty 1

## 2016-08-03 NOTE — ED Provider Notes (Addendum)
[facility] DEPT Provider Note   CSN: [id number] Arrival date & time: 08/03/16  1354     History   Chief Complaint No chief complaint on file.   HPI Caitlyn Roy is a 31 y.o. female.  HPI  32 year old female who presents with dyspnea. She states she has had dyspnea multiple times over the past several months. She has been seen and evaluated here several times including a d-dimer. She states that this episode began yesterday and she came to be seen last night but left before being evaluated. She's been seen by her primary care doctor and sent to a rheumatologist. She states that she had blood work drawn today for autoimmune disorders. She felt more short of breath tonight and came in secondary to that. She has not had cough, fever, leg swelling, DVT, PE, nausea, vomiting, or diarrhea.  Past Medical History:  Diagnosis Date  . Anxiety   . 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 Tubal pregnancy   . Vaginal odor   . Vaginitis     Patient Active Problem List   Diagnosis Date Noted  . Iron deficiency anemia 03/03/2016  . Palpitations 03/03/2016  . BV (bacterial vaginosis) 10/15/2014    Past Surgical History:  Procedure Laterality Date  . BIOPSY EYE MUSCLE    . CESAREAN SECTION  2010  . laporoscopy      OB History    Gravida Para Term Preterm AB Living   7 1 1   6 1    SAB TAB Ectopic Multiple Live Births   5   1   1        Home Medications    Prior to Admission medications   Medication Sig Start Date End Date Taking? Authorizing Provider  ibuprofen (ADVIL,MOTRIN) 800 MG tablet Take 400 mg by mouth every 8 (eight) hours as needed (for headaches and/or body pain).   Yes Historical Provider, MD  iron polysaccharides (NIFEREX) 150 MG capsule Take 150 mg by mouth daily.   Yes Historical Provider, MD    Family History Family History  Problem Relation Age of Onset  . Hypertension Mother   . Breast  cancer Mother   . Heart attack Mother   . Alcohol abuse Father     Social History Social History  Substance Use Topics  . Smoking status: Never Smoker  . Smokeless tobacco: Never Used  . Alcohol use 1.2 oz/week    2 Glasses of wine per week     Comment: occ     Allergies   Oxycodone   Review of Systems Review of Systems  All other systems reviewed and are negative.    Physical Exam Updated Vital Signs BP 116/69 (BP Location: Left Arm)   Pulse 94   Temp 98.7 F (37.1 C) (Oral)   Resp 14   LMP 07/20/2016 (Approximate)   SpO2 100%   Physical Exam  Constitutional: She is oriented to person, place, and time. She appears well-developed and well-nourished. No distress.  HENT:  Head: Normocephalic and atraumatic.  Right Ear: External ear normal.  Left Ear: External ear normal.  Nose: Nose normal.  Eyes: Conjunctivae and EOM are normal. Pupils are equal, round, and reactive to light.  Neck: Normal range of motion. Neck supple.  Cardiovascular: Normal rate, regular rhythm, normal heart sounds and intact distal pulses.   Pulmonary/Chest: Effort normal and breath sounds normal. She exhibits no tenderness.  Abdominal: Soft. Bowel sounds are normal.  Musculoskeletal: Normal range of motion.  Neurological: She is alert and oriented to person, place, and time. She exhibits normal muscle tone. Coordination normal.  Skin: Skin is warm and dry.  Psychiatric: She has a normal mood and affect. Her behavior is normal. Thought content normal.  Nursing note and vitals reviewed.    ED Treatments / Results  Labs (all labs ordered are listed, but only abnormal results are displayed) Labs Reviewed  CBC - Abnormal; Notable for the following:       Result Value   Hemoglobin 11.3 (*)    HCT 35.8 (*)    Platelets 411 (*)    All other components within normal limits  BASIC METABOLIC PANEL  I-STAT TROPOININ, ED  I-STAT ARTERIAL BLOOD GAS, ED    EKG  EKG  Interpretation  Date/Time:  Thursday August 03 2016 15:39:23 EDT Ventricular Rate:  111 PR Interval:  122 QRS Duration: 70 QT Interval:  296 QTC Calculation: 402 R Axis:   59 Text Interpretation:  Sinus tachycardia Nonspecific T wave abnormality Abnormal ECG Confirmed by Fynn Adel MD, Andee Poles [phone]) on 08/03/2016 8:29:27 PM       Radiology Dg Chest 2 View  Result Date: 08/03/2016 CLINICAL DATA:  Dyspnea, generalized chest pain beginning last night. EXAM: CHEST  2 VIEW COMPARISON:  Chest radiograph July 23, 2016 FINDINGS: Cardiomediastinal silhouette is normal. No pleural effusions or focal consolidations. Trachea projects midline and there is no pneumothorax. Soft tissue planes and included osseous structures are non-suspicious. IMPRESSION: Normal chest. Electronically Signed   By: Elon Alas M.D.   On: 08/03/2016 21:20   Ct Angio Chest Pe W Or Wo Contrast  Result Date: 08/03/2016 CLINICAL DATA:  Chest pain and dyspnea. EXAM: CT ANGIOGRAPHY CHEST WITH CONTRAST TECHNIQUE: Multidetector CT imaging of the chest was performed using the standard protocol during bolus administration of intravenous contrast. Multiplanar CT image reconstructions and MIPs were obtained to evaluate the vascular anatomy. CONTRAST:  100 cc Isovue 370 IV COMPARISON:  CXR from the same day FINDINGS: Cardiovascular: The study is of quality for the evaluation of pulmonary embolism. There are no filling defects in the central, lobar, segmental or subsegmental pulmonary artery branches to suggest acute pulmonary embolism. Great vessels are normal in course and caliber. Normal heart size. No significant pericardial fluid/thickening. No aortic aneurysm or dissection. Mediastinum/Nodes: No discrete thyroid nodules. Unremarkable esophagus. No pathologically enlarged axillary, mediastinal or hilar lymph nodes. Lungs/Pleura: No pneumothorax. No pleural effusion. No pulmonary consolidation Upper abdomen: Unremarkable. Musculoskeletal:   No aggressive appearing focal osseous lesions. Review of the MIP images confirms the above findings. IMPRESSION: No acute pulmonary embolus.  No aortic dissection.  Clear lungs. Electronically Signed   By: Ashley Royalty M.D.   On: 08/03/2016 22:56    Procedures Procedures (including critical care time)  Medications Ordered in ED Medications  albuterol (PROVENTIL HFA;VENTOLIN HFA) 108 (90 Base) MCG/ACT inhaler 2 puff (not administered)  LORazepam (ATIVAN) tablet 1 mg (1 mg Oral Given 08/03/16 2128)  iopamidol (ISOVUE-370) 76 % injection (100 mLs  Contrast Given 08/03/16 2239)     Initial Impression / Assessment and Plan / ED Course  I have reviewed the triage vital signs and the nursing notes.  Pertinent labs & imaging results that were available during my care of the patient were reviewed by me and considered in my medical decision making (see chart for details).     31 year old female with chest tightness, tachycardia, and dyspnea. Patient with elevated heart rate and per  positive. CT angiogram obtained shows no evidence of blood clots. Mild anemia with hemoglobin 11.3 EKG without signs of ischemia and troponin is normal. Patient has had thorough outpatient workup. She is followed by primary care. She is given referral to pulmonary for further evaluation.Patient advised to follow-up with primary care physician within next week. She requested Ativan. She received 1 Ativan here. I discussed with her that I'm not comfortable providing Ongoing Basis and Should Be Provided by Her Primary Care Physician.  12:41 AM Patient continues to voice concern that her chest is tight and she is dyspneic. Her sats continued to be 100%. Heart rate has been from 72-124. However, the 124 appears to be only due to beat-to-beat variation on the monitor. Her average heart rate has remained in the 90s. As I discussed with her there is no evidence of pericardial effusion, PE, pneumonia, coronary artery disease. Feel that  there may be a component of anxiety that patient does not feel this is anxiety. She is very concerned that she'll have to return does not have a diagnosis currently. Final Clinical Impressions(s) / ED Diagnoses   Final diagnoses:  Dyspnea, unspecified type    New Prescriptions New Prescriptions   No medications on file     Pattricia Boss, MD 08/03/16 [id number]    Pattricia Boss, MD 08/04/16 3762    Pattricia Boss, MD 08/04/16 [phone]

## 2016-08-03 NOTE — ED Notes (Signed)
Pt extremely anxious regarding ABG draw. Reassured by respiratory therapy and this RN.

## 2016-08-03 NOTE — ED Notes (Signed)
Pt stated that she is having a sharp pain in her left temple. Pt stated pain is 7 out of 10. Informed Dr. Jeanell Sparrow.

## 2016-08-03 NOTE — ED Triage Notes (Addendum)
Patient seen in ED yesterday for CP and shortness of breath and left prior to being seen by provider. This am pain returned and requesting evaluation. Describes the pain as sharp and intermittent, NAD. Speaking full sentences, no distress. No cough, no congestion

## 2016-08-18 ENCOUNTER — Encounter (HOSPITAL_COMMUNITY): Payer: Self-pay | Admitting: Obstetrics and Gynecology

## 2016-08-18 ENCOUNTER — Emergency Department (HOSPITAL_COMMUNITY): Payer: Managed Care, Other (non HMO)

## 2016-08-18 ENCOUNTER — Emergency Department (HOSPITAL_COMMUNITY)
Admission: EM | Admit: 2016-08-18 | Discharge: 2016-08-19 | Disposition: A | Discharge status: Home / Self Care | Payer: Managed Care, Other (non HMO) | Attending: Emergency Medicine | Admitting: Emergency Medicine

## 2016-08-18 DIAGNOSIS — Z79899 Other long term (current) drug therapy: Secondary | ICD-10-CM | POA: Insufficient documentation

## 2016-08-18 DIAGNOSIS — R0789 Other chest pain: Secondary | ICD-10-CM | POA: Diagnosis not present

## 2016-08-18 DIAGNOSIS — R079 Chest pain, unspecified: Secondary | ICD-10-CM | POA: Diagnosis present

## 2016-08-18 DIAGNOSIS — F419 Anxiety disorder, unspecified: Secondary | ICD-10-CM | POA: Diagnosis not present

## 2016-08-18 LAB — BASIC METABOLIC PANEL
Anion gap: 8 (ref 5–15)
BUN: 8 mg/dL (ref 6–20)
CO2: 24 mmol/L (ref 22–32)
Calcium: 8.9 mg/dL (ref 8.9–10.3)
Chloride: 104 mmol/L (ref 101–111)
Creatinine, Ser: 0.81 mg/dL (ref 0.44–1.00)
GFR calc Af Amer: >60 mL/min (ref >60)
GFR calc non Af Amer: >60 mL/min (ref >60)
Glucose, Bld: 93 mg/dL (ref 65–99)
Potassium: 3.2 mmol/L — ABNORMAL LOW (ref 3.5–5.1)
Sodium: 136 mmol/L (ref 135–145)

## 2016-08-18 LAB — CBC
HCT: 32.4 % — ABNORMAL LOW (ref 36.0–46.0)
Hemoglobin: 10.3 g/dL — ABNORMAL LOW (ref 12.0–15.0)
MCH: 25.6 pg — ABNORMAL LOW (ref 26.0–34.0)
MCHC: 31.8 g/dL (ref 30.0–36.0)
MCV: 80.4 fL (ref 78.0–100.0)
Platelets: 385 10*3/uL (ref 150–400)
RBC: 4.03 MIL/uL (ref 3.87–5.11)
RDW: 14.8 % (ref 11.5–15.5)
WBC: 6.5 10*3/uL (ref 4.0–10.5)

## 2016-08-18 LAB — I-STAT TROPONIN, ED: Troponin i, poc: 0 ng/mL (ref 0.00–0.08)

## 2016-08-18 NOTE — ED Triage Notes (Addendum)
Pt presents to the ED c/o constant dull left sided chest pain with shortness of breath. Denies pain radiation, and denies vomiting and diarrhea. Pt states she was recently on a medication for an infection and is unsure if she is having a reaction to that. Pt states she was seen in [place] and prescribed the medication but does not remember the name.  Pt is speaking in full sentences without difficulty.

## 2016-08-19 MED ORDER — LORAZEPAM 0.5 MG PO TABS
0.5000 mg | ORAL_TABLET | Freq: Once | ORAL | Status: AC
  Administered 2016-08-19: 0.5 mg via ORAL
  Filled 2016-08-19: qty 1

## 2016-08-19 NOTE — ED Provider Notes (Signed)
[facility] DEPT Provider Note   CSN: [id number] Arrival date & time: 08/18/16  2125     History   Chief Complaint Chief Complaint  Patient presents with  . Chest Pain  . Shortness of Breath    HPI Caitlyn Roy is a 31 y.o. female.  The history is provided by the patient.  Chest Pain   This is a recurrent problem. The current episode started 12 to 24 hours ago. The problem occurs constantly. The problem has not changed since onset.The pain is associated with rest. The pain is present in the lateral region. The pain is mild. The quality of the pain is described as dull and sharp. The pain does not radiate. Associated symptoms include shortness of breath. Pertinent negatives include no abdominal pain, no cough, no fever, no syncope and no vomiting.  Shortness of Breath  This is a recurrent problem. The problem has not changed since onset.Associated symptoms include chest pain. Pertinent negatives include no fever, no cough, no syncope, no vomiting and no abdominal pain. She has tried nothing for the symptoms.    Past Medical History:  Diagnosis Date  . Anxiety   . 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 Tubal pregnancy   . Vaginal odor   . Vaginitis     Patient Active Problem List   Diagnosis Date Noted  . Iron deficiency anemia 03/03/2016  . Palpitations 03/03/2016  . BV (bacterial vaginosis) 10/15/2014    Past Surgical History:  Procedure Laterality Date  . BIOPSY EYE MUSCLE    . CESAREAN SECTION  2010  . laporoscopy      OB History    Gravida Para Term Preterm AB Living   7 1 1   6 1    SAB TAB Ectopic Multiple Live Births   5   1   1        Home Medications    Prior to Admission medications   Medication Sig Start Date End Date Taking? Authorizing Provider  ibuprofen (ADVIL,MOTRIN) 800 MG tablet Take 400 mg by mouth every 8 (eight) hours as needed (for headaches and/or body pain).     Historical Provider, MD  iron polysaccharides (NIFEREX) 150 MG capsule Take 150 mg by mouth daily.    Historical Provider, MD    Family History Family History  Problem Relation Age of Onset  . Hypertension Mother   . Breast cancer Mother   . Heart attack Mother   . Alcohol abuse Father     Social History Social History  Substance Use Topics  . Smoking status: Never Smoker  . Smokeless tobacco: Never Used  . Alcohol use 1.2 oz/week    2 Glasses of wine per week     Comment: occ     Allergies   Oxycodone   Review of Systems Review of Systems  Constitutional: Negative for fever.  Respiratory: Positive for shortness of breath. Negative for cough.   Cardiovascular: Positive for chest pain. Negative for syncope.  Gastrointestinal: Negative for abdominal pain and vomiting.  All other systems reviewed and are negative.    Physical Exam Updated Vital Signs BP 129/79 (BP Location: Right Arm)   Pulse 89   Temp 98.3 F (36.8 C) (Oral)   Resp 16   Ht 5\' 2"  (1.575 m)   Wt 162 lb (73.5 kg)   LMP 07/18/2016 (Exact Date)   SpO2 100%   BMI 29.63 kg/m   Physical Exam  Constitutional: She is oriented to person, place, and time. She appears well-developed and well-nourished. No distress.  HENT:  Head: Normocephalic.  Nose: Nose normal.  Eyes: Conjunctivae are normal.  Neck: Neck supple. No tracheal deviation present.  Cardiovascular: Normal rate, regular rhythm and normal heart sounds.   No murmur heard. Pulmonary/Chest: Effort normal. No respiratory distress. She has no wheezes. She has no rales. She exhibits no tenderness.  Abdominal: Soft. She exhibits no distension. There is no tenderness.  Neurological: She is alert and oriented to person, place, and time.  Skin: Skin is warm and dry.  Psychiatric: She has a normal mood and affect.  Vitals reviewed.    ED Treatments / Results  Labs (all labs ordered are listed, but only abnormal results are displayed) Labs  Reviewed  BASIC METABOLIC PANEL - Abnormal; Notable for the following:       Result Value   Potassium 3.2 (*)    All other components within normal limits  CBC - Abnormal; Notable for the following:    Hemoglobin 10.3 (*)    HCT 32.4 (*)    MCH 25.6 (*)    All other components within normal limits  I-STAT TROPOININ, ED    EKG  EKG Interpretation  Date/Time:  Friday August 18 2016 21:34:10 EDT Ventricular Rate:  91 PR Interval:    QRS Duration: 73 QT Interval:  338 QTC Calculation: 416 R Axis:   54 Text Interpretation:  Sinus rhythm Normal ECG Since last tracing rate slower Otherwise no significant change Confirmed by Hanae Waiters MD, Chantz Montefusco [phone]) on 08/19/2016 12:06:02 AM       Radiology Dg Chest 2 View  Result Date: 08/18/2016 CLINICAL DATA:  Dull left-sided chest pain with dyspnea. EXAM: CHEST  2 VIEW COMPARISON:  07/07/2016 FINDINGS: The heart size and mediastinal contours are within normal limits. Both lungs are clear. The visualized skeletal structures are unremarkable. IMPRESSION: No active cardiopulmonary disease. Electronically Signed   By: Ashley Royalty M.D.   On: 08/18/2016 22:09    Procedures Procedures (including critical care time)  Medications Ordered in ED Medications  LORazepam (ATIVAN) tablet 0.5 mg (0.5 mg Oral Given 08/19/16 0051)     Initial Impression / Assessment and Plan / ED Course  I have reviewed the triage vital signs and the nursing notes.  Pertinent labs & imaging results that were available during my care of the patient were reviewed by me and considered in my medical decision making (see chart for details).     31 y.o. female presents with recurrent episode of left sided atypical chest pain. She appears quite anxious. She has had multiple workups recently that showed no cardiac etiology and repeat evaluation today shows no signs of ACS and Pt is low risk. She is a single mother and full time student under a large amount of stress. After  discussion she agrees that her stress is poorly controlled and is open to starting counseling but is very wary of medications because of perceived side effects. She was given low dose ativan for help with symptoms in short term. Patient needs to establish primary care in the area and was provided contact information to do so. Provided OP counseling information. Return precautions discussed for worsening or new concerning symptoms.   Final Clinical Impressions(s) / ED Diagnoses   Final diagnoses:  Anxiety  Atypical chest pain    New Prescriptions New Prescriptions   No medications on file     Leo Grosser, MD 08/19/16 0236

## 2016-08-23 ENCOUNTER — Other Ambulatory Visit: Payer: Self-pay | Admitting: Obstetrics and Gynecology

## 2016-08-23 ENCOUNTER — Other Ambulatory Visit (HOSPITAL_COMMUNITY)
Admission: RE | Admit: 2016-08-23 | Discharge: 2016-08-23 | Disposition: A | Discharge status: Home / Self Care | Payer: Managed Care, Other (non HMO) | Source: Ambulatory Visit | Attending: Obstetrics and Gynecology | Admitting: Obstetrics and Gynecology

## 2016-08-23 DIAGNOSIS — Z01419 Encounter for gynecological examination (general) (routine) without abnormal findings: Secondary | ICD-10-CM | POA: Insufficient documentation

## 2016-08-23 DIAGNOSIS — Z1151 Encounter for screening for human papillomavirus (HPV): Secondary | ICD-10-CM | POA: Diagnosis not present

## 2016-08-24 LAB — CYTOLOGY - PAP
Diagnosis: NEGATIVE
HPV: NOT DETECTED

## 2016-08-28 ENCOUNTER — Ambulatory Visit (INDEPENDENT_AMBULATORY_CARE_PROVIDER_SITE_OTHER): Payer: Managed Care, Other (non HMO) | Admitting: Nurse Practitioner

## 2016-08-28 ENCOUNTER — Telehealth: Payer: Self-pay | Admitting: Internal Medicine

## 2016-08-28 ENCOUNTER — Encounter: Payer: Self-pay | Admitting: Nurse Practitioner

## 2016-08-28 VITALS — BP 124/84 | HR 94 | Ht 62.0 in | Wt 165.4 lb

## 2016-08-28 DIAGNOSIS — R0789 Other chest pain: Secondary | ICD-10-CM | POA: Diagnosis not present

## 2016-08-28 DIAGNOSIS — R0602 Shortness of breath: Secondary | ICD-10-CM | POA: Diagnosis not present

## 2016-08-28 DIAGNOSIS — R002 Palpitations: Secondary | ICD-10-CM | POA: Diagnosis not present

## 2016-08-28 NOTE — Progress Notes (Deleted)
Cardiology Office Note    Date:  08/28/2016   ID:  Caitlyn Roy, DOB 16-Oct-1985, MRN [id number]  PCP:  Osborne Casco, MD  Cardiologist:  Dr. Saunders Revel  Chief Complaint: Shortness of breath with exercise  History of Present Illness:   Caitlyn Roy is a 31 y.o. female with history of palpitation, chest pain, orbital pseudotumor, iron deficiency anemia,and anxiety presents for shortness of breath with exercise.  Hx of palpitation that improved with addition of sertraline.Last seen by Dr. Saunders Revel 06/09/16. She continue to have palpitation and chest pain. Multiple ER visit for chest pain and pelvic pain prior to this. She worn 48 hours monitor 05/2016. She had occasional palpitation while wearing monitor but no recorded events corresponding to this. Her monitor showed occasional PACs and  two brief runs of SVT (lasting 4 beats).  Duo to ongoing symptoms she had an exercise stress that 07/2016 which is normal. She declined low dose BB for palpitations.     Past Medical History:  Diagnosis Date  . Anxiety   . 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 Tubal pregnancy   . Vaginal odor   . Vaginitis     Past Surgical History:  Procedure Laterality Date  . BIOPSY EYE MUSCLE    . CESAREAN SECTION  2010  . laporoscopy      Current Medications: Prior to Admission medications   Medication Sig Start Date End Date Taking? Authorizing Provider  ibuprofen (ADVIL,MOTRIN) 800 MG tablet Take 400 mg by mouth every 8 (eight) hours as needed (for headaches and/or body pain).    Historical Provider, MD  iron polysaccharides (NIFEREX) 150 MG capsule Take 150 mg by mouth daily.    Historical Provider, MD    Allergies:   Oxycodone   Social History   Social History  . Marital status: Single    Spouse name: N/A  . Number of children: 1  . Years of education: college    Social History Main Topics  . Smoking status: Never Smoker   . Smokeless tobacco: Never Used  . Alcohol use 1.2 oz/week    2 Glasses of wine per week     Comment: occ  . Drug use: No  . Sexual activity: Yes    Birth control/ protection: Condom   Other Topics Concern  . Not on file   Social History Narrative   Lives at home with son   Drinks no caffeine      Family History:  The patient's family history includes Alcohol abuse in her father; Breast cancer in her mother; Heart attack in her mother; Hypertension in her mother. ***  ROS:   Please see the history of present illness.    ROS All other systems reviewed and are negative.   PHYSICAL EXAM:   VS:  There were no vitals taken for this visit.   GEN: Well nourished, well developed, in no acute distress  HEENT: normal  Neck: no JVD, carotid bruits, or masses Cardiac: ***RRR; no murmurs, rubs, or gallops,no edema  Respiratory:  clear to auscultation bilaterally, normal work of breathing GI: soft, nontender, nondistended, + BS MS: no deformity or atrophy  Skin: warm and dry, no rash Neuro:  Alert and Oriented x 3, Strength and sensation are intact Psych: euthymic mood, full affect  Wt Readings from Last 3 Encounters:  08/18/16 162 lb (73.5 kg)  08/02/16 164 lb (74.4 kg)  07/23/16 164 lb (74.4 kg)  Studies/Labs Reviewed:   EKG:  EKG is ordered today.  The ekg ordered today demonstrates ***  Recent Labs: 01/28/2016: TSH 2.211 06/05/2016: ALT 25 08/18/2016: BUN 8; Creatinine, Ser 0.81; Hemoglobin 10.3; Platelets 385; Potassium 3.2; Sodium 136   Lipid Panel No results found for: CHOL, TRIG, HDL, CHOLHDL, VLDL, LDLCALC, LDLDIRECT  Additional studies/ records that were reviewed today include:   ETT 07/12/16  Blood pressure demonstrated a normal response to exercise.  There was no ST segment deviation noted during stress.   Normal ETT   48 hours monitor 05/2016  The patient was monitored for 48 hours.  The predominant rhythm was sinus with an average rate of 93 bpm  (range 56-164 bpm). The longest R-R interval was 1.4 seconds.  Rare premature atrial contractions were noted, including one atrial pair and two episodes of supraventricular tachycardia lasting 4 beats.  No ventricular ectopy, sustained arrhythmias, or prolonged pauses were identified.  Diary events correspond to normal sinus rhythm and sinus arrhythmia.   Rare PACs and brief PSVT (4 beats).   ASSESSMENT & PLAN:    1.Ongoing chest  Pain - difficult historian. Ongoing for past x *** months. ETT normal 07/12/16.  2. Palpitations - Prior evaluation with 48 hours monitor did not showed any arrhythmias. It showed PACs and 2 episode of SVT (4 beats)/     Medication Adjustments/Labs and Tests Ordered: Current medicines are reviewed at length with the patient today.  Concerns regarding medicines are outlined above.  Medication changes, Labs and Tests ordered today are listed in the Patient Instructions below. There are no Patient Instructions on file for this visit.   Jarrett Soho, [place]  08/28/2016 12:55 PM    [facility] [place], [place],   [address] Phone: [phone]; Fax: [phone]

## 2016-08-28 NOTE — Telephone Encounter (Signed)
Follow up     Pt c/o of Chest Pain: STAT if CP now or developed within 24 hours  1. Are you having CP right now? Yes   2. Are you experiencing any other symptoms (ex. SOB, nausea, vomiting, sweating)?  no  3. How long have you been experiencing CP? An hour after taking her iron pill   4. Is your CP continuous or coming and going? Constant chest pressure   5. Have you taken Nitroglycerin? no ?

## 2016-08-28 NOTE — Telephone Encounter (Signed)
New message     Pt c/o of sob when she exercises.  She requested an appt.  Pt is coming in wed to see Vin.  However, since she is having sob on exertion, I am sending a phone message for pt to be called to see if it is ok to wait until wed.

## 2016-08-28 NOTE — Progress Notes (Signed)
CARDIOLOGY OFFICE NOTE  Date:  08/28/2016    Caitlyn Roy Date of Birth: Jan 16, 1986 Medical Record #[id number]  PCP:  Osborne Casco, MD  Cardiologist:  End  Chief Complaint  Patient presents with  . Breathing Problem    History of Present Illness: Caitlyn Roy is a 31 y.o. female who presents today for a work in visit. Seen for Dr. Saunders Revel.  She is a 33 female with history of orbital pseudotumor, iron deficiency anemia,and anxiety, who was evaluated for palpitations, dyspnea and chest pain back in February. Her symptoms had noted to have improved with sertraline. 48 hour Holter with rare PACs and two brief runs of SVT (lasting 4 beats).  She has been to the ER for evaluation and left before being evaluated. She has also had 3 other [facility] for [facility] pain and vaginal bleeding.   Phone call today - "Call transferred directly to triage for patient with complaints of chest pain.  In speaking with the patient, she relates her chest discomfort to the use of her iron pill.  She had been on this in the past and come off of it- this was restarted "last year" per the patient by her PCP, Dr. Laurann Montana, whom she is no longer seeing- she has switched to Dr. Stephanie Acre.  It is unclear to me in speaking with the patient iff she was off this again and restarted recently- she states she has started having chest pain/ pressure/ anxiety while back on her iron.  She seems to be anxious this afternoon and is requesting to be seen today by cardiology.  She had an appointment Wednesday with Vin, PA for an earlier call today with complaints of SOB.  She is requesting to be seen for an EKG today- offered her an appointment at 3:30 pm today with Cecille Rubin, NP.  The patient is agreeable.  Thus added to my schedule.   Comes in today. Here alone. Says she is here "all for the same stuff". She continues to have chest pain, short of breath and palpitations. Sometimes has all the symptoms,  sometimes just one or two. Just comes and goes. Feels like she is going to pass out but has not. Too scared to exercise. She does not feel like this is all anxiety. She also notes multiple ER visits. Does not smoke. No caffeine. Has stopped alcohol. Notes some chest soreness with palpitation. Potassium low at last check - not clear as to why.   Past Medical History:  Diagnosis Date  . Anxiety   . 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 Tubal pregnancy   . Vaginal odor   . Vaginitis     Past Surgical History:  Procedure Laterality Date  . BIOPSY EYE MUSCLE    . CESAREAN SECTION  2010  . laporoscopy       Medications: Current Outpatient Prescriptions  Medication Sig Dispense Refill  . ibuprofen (ADVIL,MOTRIN) 800 MG tablet Take 400 mg by mouth every 8 (eight) hours as needed (for headaches and/or body pain).    . iron polysaccharides (NIFEREX) 150 MG capsule Take 150 mg by mouth daily.     No current facility-administered medications for this visit.     Allergies: Allergies  Allergen Reactions  . Oxycodone Shortness Of Breath    Social History: The patient  reports that she has never smoked. She has never used smokeless tobacco. She reports that she drinks about 1.2  oz of alcohol per week . She reports that she does not use drugs.   Family History: The patient's family history includes Alcohol abuse in her father; Breast cancer in her mother; Heart attack in her mother; Hypertension in her mother.   Review of Systems: Please see the history of present illness.   Otherwise, the review of systems is positive for none.   All other systems are reviewed and negative.   Physical Exam: VS:  BP 124/84   Pulse 94   Ht 5\' 2"  (1.575 m)   Wt 165 lb 6.4 oz (75 kg)   SpO2 94%   BMI 30.25 kg/m  .  BMI Body mass index is 30.25 kg/m.  Wt Readings from Last 3 Encounters:  08/28/16 165 lb 6.4 oz (75 kg)  08/18/16 162 lb  (73.5 kg)  08/02/16 164 lb (74.4 kg)    General: Pleasant. She seems a little anxious to me. She is alert and in no acute distress.   HEENT: Normal.  Neck: Supple, no JVD, carotid bruits, or masses noted.  Cardiac: Regular rate and rhythm. No murmurs, rubs, or gallops. No edema.  Respiratory:  Lungs are clear to auscultation bilaterally with normal work of breathing.  GI: Soft and nontender.  MS: No deformity or atrophy. Gait and ROM intact.  Skin: Warm and dry. Color is normal.  Neuro:  Strength and sensation are intact and no gross focal deficits noted.  Psych: Alert, appropriate and with normal affect.   LABORATORY DATA:  EKG:  EKG is ordered today. This demonstrates NSR.  Lab Results  Component Value Date   WBC 6.5 08/18/2016   HGB 10.3 (L) 08/18/2016   HCT 32.4 (L) 08/18/2016   PLT 385 08/18/2016   GLUCOSE 93 08/18/2016   ALT 25 06/05/2016   AST 26 06/05/2016   NA 136 08/18/2016   K 3.2 (L) 08/18/2016   CL 104 08/18/2016   CREATININE 0.81 08/18/2016   BUN 8 08/18/2016   CO2 24 08/18/2016   TSH 2.211 01/28/2016    BNP (last 3 results) No results for input(s): BNP in the last 8760 hours.  ProBNP (last 3 results) No results for input(s): PROBNP in the last 8760 hours.   Other Studies Reviewed Today:  GXT Study Highlights     Blood pressure demonstrated a normal response to exercise.  There was no ST segment deviation noted during stress.   Normal ETT    Holter Study Highlights     The patient was monitored for 48 hours.  The predominant rhythm was sinus with an average rate of 93 bpm (range 56-164 bpm). The longest R-R interval was 1.4 seconds.  Rare premature atrial contractions were noted, including one atrial pair and two episodes of supraventricular tachycardia lasting 4 beats.  No ventricular ectopy, sustained arrhythmias, or prolonged pauses were identified.  Diary events correspond to normal sinus rhythm and sinus arrhythmia.   Rare  PACs and brief PSVT (4 beats).     Assessment/Plan:  Chest pain/shortness of breath/palpitations - she has had her monitor and GXT. I do not think this needs to be repeated. EKG today is normal. Will arrange for echo to make sure she has no structural issues. [place] lab today as well. I think the likelihood of cardiac issues is quite low and if her echo is ok - would refer her back to PCP for evaluation. Reassured her that her monitor results are very reassuring. Recheck lab today - if potassium is low -  would replace and then explore as to why this is low. I have offered her low dose Inderal - she would like to hold off.   Anemia - on iron replacement - this could be aggravating her symptoms but last HGB was 10. Recheck today.   Current medicines are reviewed with the patient today.  The patient does not have concerns regarding medicines other than what has been noted above.  The following changes have been made:  See above.  Labs/ tests ordered today include:    Orders Placed This Encounter  Procedures  . Basic metabolic panel  . CBC  . TSH  . Pro b natriuretic peptide (BNP)  . EKG 12-Lead  . ECHOCARDIOGRAM COMPLETE     Disposition:   Further disposition pending.    Patient is agreeable to this plan and will call if any problems develop in the interim.   SignedTruitt Merle, NP  08/28/2016 4:15 PM  [facility] [address] [place], [place]  [address] Phone: [phone] Fax: [phone]

## 2016-08-28 NOTE — Telephone Encounter (Signed)
Call transferred directly to triage for patient with complaints of chest pain. In speaking with the patient, she relates her chest discomfort to the use of her iron pill. She had been on this in the past and come off of it- this was restarted "last year" per the patient by her PCP, Dr. Laurann Montana, whom she is no longer seeing- she has switched to Dr. Stephanie Acre. It is unclear to me in speaking with the patient iff she was off this again and restarted recently- she states she has started having chest pain/ pressure/ anxiety while back on her iron. She seems to be anxious this afternoon and is requesting to be seen today by cardiology. She had an appointment Wednesday with Vin, PA for an earlier call today with complaints of SOB.   She is requesting to be seen for an EKG today- offered her an appointment at 3:30 pm today with Cecille Rubin, NP. The patient is agreeable.

## 2016-08-28 NOTE — Patient Instructions (Addendum)
We will be checking the following labs today - BMET, CBC, TSH, BNP   Medication Instructions:    Continue with your current medicines.     Testing/Procedures To Be Arranged:  Echocardiogram 1ST AVAILABLE  Follow-Up:   Will see how your tests turn out and then decide about follow up.    Other Special Instructions:   N/A    If you need a refill on your cardiac medications before your next appointment, please call your pharmacy.   Call the [place] office at [phone] if you have any questions, problems or concerns.    Echocardiogram An echocardiogram, or echocardiography, uses sound waves (ultrasound) to produce an image of your heart. The echocardiogram is simple, painless, obtained within a short period of time, and offers valuable information to your health care provider. The images from an echocardiogram can provide information such as:  Evidence of coronary artery disease (CAD).  Heart size.  Heart muscle function.  Heart valve function.  Aneurysm detection.  Evidence of a past heart attack.  Fluid buildup around the heart.  Heart muscle thickening.  Assess heart valve function. Tell a health care provider about:  Any allergies you have.  All medicines you are taking, including vitamins, herbs, eye drops, creams, and over-the-counter medicines.  Any problems you or family members have had with anesthetic medicines.  Any blood disorders you have.  Any surgeries you have had.  Any medical conditions you have.  Whether you are pregnant or may be pregnant. What happens before the procedure? No special preparation is needed. Eat and drink normally. What happens during the procedure?  In order to produce an image of your heart, gel will be applied to your chest and a wand-like tool (transducer) will be moved over your chest. The gel will help transmit the sound waves from the transducer. The sound waves will harmlessly  bounce off your heart to allow the heart images to be captured in real-time motion. These images will then be recorded.  You may need an IV to receive a medicine that improves the quality of the pictures. What happens after the procedure? You may return to your normal schedule including diet, activities, and medicines, unless your health care provider tells you otherwise. This information is not intended to replace advice given to you by your health care provider. Make sure you discuss any questions you have with your health care provider. Document Released: 04/21/2000 Document Revised: 12/11/2015 Document Reviewed: 12/30/2012 Elsevier Interactive Patient Education  2017 [employer].

## 2016-08-28 NOTE — Telephone Encounter (Signed)
Follow up    Pt is calling back about her chest pain. She asked for an appt today.

## 2016-08-29 LAB — BASIC METABOLIC PANEL
BUN/Creatinine Ratio: 16 (ref 9–23)
BUN: 11 mg/dL (ref 6–20)
CO2: 24 mmol/L (ref 18–29)
Calcium: 9.1 mg/dL (ref 8.7–10.2)
Chloride: 99 mmol/L (ref 96–106)
Creatinine, Ser: 0.68 mg/dL (ref 0.57–1.00)
GFR calc Af Amer: 135 mL/min/{1.73_m2} (ref >59)
GFR calc non Af Amer: 117 mL/min/{1.73_m2} (ref >59)
Glucose: 71 mg/dL (ref 65–99)
Potassium: 4.6 mmol/L (ref 3.5–5.2)
Sodium: 139 mmol/L (ref 134–144)

## 2016-08-29 LAB — CBC
Hematocrit: 31.7 % — ABNORMAL LOW (ref 34.0–46.6)
Hemoglobin: 10.5 g/dL — ABNORMAL LOW (ref 11.1–15.9)
MCH: 26.6 pg (ref 26.6–33.0)
MCHC: 33.1 g/dL (ref 31.5–35.7)
MCV: 81 fL (ref 79–97)
Platelets: 438 10*3/uL — ABNORMAL HIGH (ref 150–379)
RBC: 3.94 x10E6/uL (ref 3.77–5.28)
RDW: 15.8 % — ABNORMAL HIGH (ref 12.3–15.4)
WBC: 6.4 10*3/uL (ref 3.4–10.8)

## 2016-08-29 LAB — TSH: TSH: 1.35 u[IU]/mL (ref 0.450–4.500)

## 2016-08-29 LAB — PRO B NATRIURETIC PEPTIDE: NT-Pro BNP: 19 pg/mL (ref 0–130)

## 2016-08-30 ENCOUNTER — Ambulatory Visit: Payer: Managed Care, Other (non HMO) | Admitting: Physician Assistant

## 2016-08-31 ENCOUNTER — Telehealth: Payer: Self-pay | Admitting: Nurse Practitioner

## 2016-08-31 NOTE — Telephone Encounter (Signed)
New message    Pt is calling to ask if feeling jittery is normal when taking vitamin D.

## 2016-08-31 NOTE — Telephone Encounter (Signed)
s/w pt stated gyn prescribed the VIT D and pt had already called that office and was told Vit D cannot make pt jittery  Pt did ask if Vit D can make pt jittery, stated no it can not make your feel jittery and pt stated " I thinks its my anxiety"..Pt stated appreciation.

## 2016-09-04 ENCOUNTER — Ambulatory Visit (INDEPENDENT_AMBULATORY_CARE_PROVIDER_SITE_OTHER): Payer: Managed Care, Other (non HMO) | Admitting: Diagnostic Neuroimaging

## 2016-09-04 ENCOUNTER — Encounter: Payer: Self-pay | Admitting: Diagnostic Neuroimaging

## 2016-09-04 VITALS — BP 112/76 | HR 117 | Wt 165.4 lb

## 2016-09-04 DIAGNOSIS — R202 Paresthesia of skin: Secondary | ICD-10-CM

## 2016-09-04 DIAGNOSIS — R200 Anesthesia of skin: Secondary | ICD-10-CM

## 2016-09-04 NOTE — Progress Notes (Signed)
[facility]  PATIENT: Caitlyn Roy DOB: 1985/11/21  REFERRING CLINICIAN:  HISTORY FROM: patient  REASON FOR VISIT: follow up    HISTORICAL  CHIEF COMPLAINT:  Chief Complaint  Patient presents with  . left arm paresthesia    rm 6, "problems with L arm began again;  numbness, weakness in L arm"   . Follow-up    ED FU    HISTORY OF PRESENT ILLNESS:   UPDATE 09/04/16: Since last visit, has had intermittent left arm numbness and mild weakness. This started around March 2018. Had eye doctor evaluation.   UPDATE 03/20/16: Since last visit, has had recurrence of intermittent eye pressure sensation, and is concerned that orbital pseudotumor is recurring. Symptoms are recurring in the last 2 months.   UPDATE 04/21/15: Since last visit, had 1 more event of transient right sided numbness and left sided headache. No nausea, vomit, photophobia or phonophobia.  UPDATE 10/13/14: Since last visit, had 1 more episode of right hand numbness. Also has 5 days of right foot/ankle pain (no injury). Still not established with PCP. MRI and EEG were normal.  PRIOR HPI (09/10/14): 31 year old right-handed female with history of orbital pseudotumor in 2014 status post steroid and radiation therapy, anemia, here for evaluation of transient right-sided numbness. March 2016 patient had 5 minute episode of right lip numbness. Symptoms resolved on their own. 08/31/14 patient had 30 minute episode of right lip, cheek, face, scalp numbness. Patient went to the emergency room, was evaluated and discharged home. 09/01/14 patient had a 4 hour episode of right lip, face, arm and hand numbness. Patient had sharp pains on the right side of her head following this event. Patient was evaluated emergency room with CT scanning of the head and then discharged home. Since that time patient had no further events.  PRIOR EVENTS: - October 2015 patient had syncopal event while at work, after she had gone to the  bathroom. Patient collapsed and her vital signs at that time showed hypotension. - In 2014 patient had onset of scleral injection, swelling in eyes, blurred vision, diagnosed with orbital pseudotumor (biopsy proven), initially treated with steroids, but was unable to be weaned. She was then treated with radiation therapy to the orbits and since that time her symptoms have resolved.   REVIEW OF SYSTEMS: Full 14 system review of systems performed and negative except: numbness weakness joint pain aching muscles.   ALLERGIES: Allergies  Allergen Reactions  . Oxycodone Shortness Of Breath    HOME MEDICATIONS: Outpatient Medications Prior to Visit  Medication Sig Dispense Refill  . ibuprofen (ADVIL,MOTRIN) 800 MG tablet Take 400 mg by mouth every 8 (eight) hours as needed (for headaches and/or body pain).    . iron polysaccharides (NIFEREX) 150 MG capsule Take 150 mg by mouth daily.     No facility-administered medications prior to visit.     PAST MEDICAL HISTORY: Past Medical History:  Diagnosis Date  . Anxiety   . 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 Tubal pregnancy   . Vaginal odor   . Vaginitis     PAST SURGICAL HISTORY: Past Surgical History:  Procedure Laterality Date  . BIOPSY EYE MUSCLE    . CESAREAN SECTION  2010  . laporoscopy      FAMILY HISTORY: Family History  Problem Relation Age of Onset  . Hypertension Mother   . Breast cancer Mother   . Heart attack Mother   .  Alcohol abuse Father     SOCIAL HISTORY:  Social History   Social History  . Marital status: Single    Spouse name: N/A  . Number of children: 1  . Years of education: college    Occupational History  . Not on file.   Social History Main Topics  . Smoking status: Never Smoker  . Smokeless tobacco: Never Used  . Alcohol use 1.2 oz/week    2 Glasses of wine per week     Comment: occ  . Drug use: No  . Sexual activity:  Yes    Birth control/ protection: Condom   Other Topics Concern  . Not on file   Social History Narrative   Lives at home with son   Drinks no caffeine      PHYSICAL EXAM  Vitals:   09/04/16 1409  BP: 112/76  Pulse: (!) 117  Weight: 165 lb 6.4 oz (75 kg)   No exam data present  Body mass index is 30.25 kg/m.  No exam data present  No flowsheet data found.  GENERAL EXAM: Patient is in no distress; well developed, nourished and groomed; neck is supple  CARDIOVASCULAR: Regular rate and rhythm, no murmurs, no carotid bruits  NEUROLOGIC: MENTAL STATUS: awake, alert, language fluent, comprehension intact, naming intact, fund of knowledge appropriate CRANIAL NERVE: pupils equal and reactive to light, visual fields full to confrontation, extraocular muscles intact, no nystagmus, facial sensation and strength symmetric, hearing intact, palate elevates symmetrically, uvula midline, shoulder shrug symmetric, tongue midline. MOTOR: normal bulk and tone, full strength in the BUE, BLE SENSORY: normal and symmetric to light touch COORDINATION: finger-nose-finger, fine finger movements normal REFLEXES: deep tendon reflexes present and symmetric GAIT/STATION: narrow based gait    DIAGNOSTIC DATA (LABS, IMAGING, TESTING) - I reviewed patient records, labs, notes, testing and imaging myself where available.  Lab Results  Component Value Date   WBC 6.4 08/28/2016   HGB 10.3 (L) 08/18/2016   HCT 31.7 (L) 08/28/2016   MCV 81 08/28/2016   PLT 438 (H) 08/28/2016      Component Value Date/Time   NA 139 08/28/2016 1634   NA 138 10/17/2013 0442   K 4.6 08/28/2016 1634   K 3.1 (L) 10/17/2013 0442   CL 99 08/28/2016 1634   CL 107 10/17/2013 0442   CO2 24 08/28/2016 1634   CO2 25 10/17/2013 0442   GLUCOSE 71 08/28/2016 1634   GLUCOSE 93 08/18/2016 2148   GLUCOSE 81 10/17/2013 0442   BUN 11 08/28/2016 1634   BUN 8 10/17/2013 0442   CREATININE 0.68 08/28/2016 1634    CREATININE 0.88 10/17/2013 0442   CALCIUM 9.1 08/28/2016 1634   CALCIUM 8.2 (L) 10/17/2013 0442   PROT 7.8 06/05/2016 1752   ALBUMIN 4.1 06/05/2016 1752   AST 26 06/05/2016 1752   ALT 25 06/05/2016 1752   ALKPHOS 89 06/05/2016 1752   BILITOT 0.2 (L) 06/05/2016 1752   GFRNONAA 117 08/28/2016 1634   GFRNONAA >60 10/17/2013 0442   GFRAA 135 08/28/2016 1634   GFRAA >60 10/17/2013 0442   No results found for: CHOL, HDL, LDLCALC, LDLDIRECT, TRIG, CHOLHDL No results found for: HGBA1C No results found for: VITAMINB12 Lab Results  Component Value Date   TSH 1.350 08/28/2016    02/25/14 CT head 1. No acute displaced skull fractures or acute intracranial abnormalities. 2. The appearance of the brain is normal.  09/01/14 CT head 1. Normal CT appearance of the brain. 2. Mild paranasal sinus disease,  predominantly within the ethmoid air cells.  09/09/14 MRI brain - normal; the study was compared to an MRI of the orbits from 04/03/2014. There is been no interval change.  09/09/14 EEG - normal  04/02/16 MRI brain with and without contrast shows the following: 1.   There are 2 punctate T2/FLAIR hyperintense foci in the subcortical white matter of the frontal lobes.   This could be incidental or due to migraine minimal chronic microvascular ischemic change.  This is unlikely to be clinically significant.   2.   Mild chronic maxillary and ethmoid sinusitis, similar to appearance to the 04/03/2014 MRI. 3.   There is a normal enhancement pattern. There are no acute findings.  04/02/16 MRI of the orbits with and without contrast shows the following: 1.   Moderate enlargement of the lacrimal glands, increased when compared to the 04/03/2014 MRI and mild enlargement of the extraocular muscles.   These changes are consistent with orbital pseudotumor. 2.   Mild chronic maxillary and ethmoid sinusitis. 3.   No acute findings.    ASSESSMENT AND PLAN  31 y.o. year old female here with transient right  face and arm numbness since March 2016, sometimes associated with left sided headaches.   Ddx eye pressure: orbital pseudotumor vs other primary eye related issue  Ddx subjective intermittent left arm numbness and weakness: possible pinched nerve (neck, elbow or wrist) but neuro exam is otherwise normal   PLAN: I spent 15 minutes of face to face time with patient. Greater than 50% of time was spent in counseling and coordination of care with patient. In summary we discussed:   - no further neurodiagnostic testing advised - continue follow up with PCP  Return if symptoms worsen or fail to improve, for return to PCP.    Penni Bombard, MD 0/62/3762, 8:31 PM Certified in Neurology, Neurophysiology and Neuroimaging  [facility] [address] [place], [place] [address] [phone]

## 2016-09-08 ENCOUNTER — Other Ambulatory Visit (HOSPITAL_COMMUNITY): Payer: Managed Care, Other (non HMO)

## 2016-09-10 ENCOUNTER — Emergency Department (HOSPITAL_COMMUNITY)
Admission: EM | Admit: 2016-09-10 | Discharge: 2016-09-10 | Disposition: A | Discharge status: Home / Self Care | Payer: Managed Care, Other (non HMO) | Attending: Emergency Medicine | Admitting: Emergency Medicine

## 2016-09-10 ENCOUNTER — Encounter (HOSPITAL_COMMUNITY): Payer: Self-pay | Admitting: Emergency Medicine

## 2016-09-10 DIAGNOSIS — Z79899 Other long term (current) drug therapy: Secondary | ICD-10-CM | POA: Diagnosis not present

## 2016-09-10 DIAGNOSIS — R42 Dizziness and giddiness: Secondary | ICD-10-CM | POA: Diagnosis present

## 2016-09-10 HISTORY — DX: Anemia, unspecified: D64.9

## 2016-09-10 LAB — URINALYSIS, ROUTINE W REFLEX MICROSCOPIC
Bilirubin Urine: NEGATIVE
Glucose, UA: NEGATIVE mg/dL
Ketones, ur: NEGATIVE mg/dL
Leukocytes, UA: NEGATIVE
Nitrite: NEGATIVE
Protein, ur: NEGATIVE mg/dL
Specific Gravity, Urine: 1.018 (ref 1.005–1.030)
pH: 5 (ref 5.0–8.0)

## 2016-09-10 LAB — I-STAT BETA HCG BLOOD, ED (MC, WL, AP ONLY): I-stat hCG, quantitative: <5 m[IU]/mL (ref <5)

## 2016-09-10 LAB — CBC
HCT: 34 % — ABNORMAL LOW (ref 36.0–46.0)
Hemoglobin: 11.1 g/dL — ABNORMAL LOW (ref 12.0–15.0)
MCH: 26.9 pg (ref 26.0–34.0)
MCHC: 32.6 g/dL (ref 30.0–36.0)
MCV: 82.3 fL (ref 78.0–100.0)
Platelets: 431 10*3/uL — ABNORMAL HIGH (ref 150–400)
RBC: 4.13 MIL/uL (ref 3.87–5.11)
RDW: 15 % (ref 11.5–15.5)
WBC: 5 10*3/uL (ref 4.0–10.5)

## 2016-09-10 LAB — BASIC METABOLIC PANEL
Anion gap: 7 (ref 5–15)
BUN: 8 mg/dL (ref 6–20)
CO2: 26 mmol/L (ref 22–32)
Calcium: 9.1 mg/dL (ref 8.9–10.3)
Chloride: 105 mmol/L (ref 101–111)
Creatinine, Ser: 0.81 mg/dL (ref 0.44–1.00)
GFR calc Af Amer: >60 mL/min (ref >60)
GFR calc non Af Amer: >60 mL/min (ref >60)
Glucose, Bld: 120 mg/dL — ABNORMAL HIGH (ref 65–99)
Potassium: 3.7 mmol/L (ref 3.5–5.1)
Sodium: 138 mmol/L (ref 135–145)

## 2016-09-10 NOTE — Discharge Instructions (Signed)
It was our pleasure to provide your ER care today - we hope that you feel better.  Your lab work looks good - you do have mild anemia, but your blood count/hemoglobin appears stable compared to prior.   Follow up with primary care doctor in the next 1-2 weeks.   Return to ER if worse, new symptoms, fevers, fainting episodes, trouble breathing, other concern.

## 2016-09-10 NOTE — ED Notes (Signed)
ED Provider at bedside. 

## 2016-09-10 NOTE — ED Triage Notes (Signed)
Pt reports lightheadedness for the past several days that is worse around having a bowel movement. Associated with nausea.

## 2016-09-10 NOTE — ED Provider Notes (Signed)
[facility] DEPT Provider Note   CSN: [id number] Arrival date & time: 09/10/16  1657     History   Chief Complaint Chief Complaint  Patient presents with  . Dizziness    HPI Gift Rueckert is a 31 y.o. female.  Patient c/o intermittent lightheadedness in past couple weeks.  States notices more before or after having bm.  Denies nausea or vomiting. No diarrhea. Denies fever or chills. No abd pain. No palpitations or hx dysrhythmia. No syncope. Denies any recent heavy blood loss, although states periods at baseline tend to be heavy. Hx anemia, states takes iron once a day, if/when she remembers to.  Denies any recent increased bleeding or new bleeding. No rectal bleeding or melena.  Denies any recent prescription med use. Denies chest pain or discomfort. No unusual doe or sob. Did start her normal period today.    The history is provided by the patient.  Dizziness  Associated symptoms: no blood in stool, no chest pain, no headaches and no shortness of breath     Past Medical History:  Diagnosis Date  . Anemia   . Anxiety   . 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 Tubal pregnancy   . Vaginal odor   . Vaginitis     Patient Active Problem List   Diagnosis Date Noted  . Iron deficiency anemia 03/03/2016  . Palpitations 03/03/2016  . BV (bacterial vaginosis) 10/15/2014    Past Surgical History:  Procedure Laterality Date  . BIOPSY EYE MUSCLE    . CESAREAN SECTION  2010  . laporoscopy      OB History    Gravida Para Term Preterm AB Living   7 1 1   6 1    SAB TAB Ectopic Multiple Live Births   5   1   1        Home Medications    Prior to Admission medications   Medication Sig Start Date End Date Taking? Authorizing Provider  ibuprofen (ADVIL,MOTRIN) 800 MG tablet Take 400 mg by mouth every 8 (eight) hours as needed (for headaches and/or body pain).    [provider]  iron  polysaccharides (NIFEREX) 150 MG capsule Take 150 mg by mouth daily.    [provider]    Family History Family History  Problem Relation Age of Onset  . Hypertension Mother   . Breast cancer Mother   . Heart attack Mother   . Alcohol abuse Father     Social History Social History  Substance Use Topics  . Smoking status: Never Smoker  . Smokeless tobacco: Never Used  . Alcohol use 1.2 oz/week    2 Glasses of wine per week     Comment: occ     Allergies   Oxycodone   Review of Systems Review of Systems  Constitutional: Negative for chills and fever.  HENT: Negative for sore throat.   Eyes: Negative for visual disturbance.  Respiratory: Negative for shortness of breath.   Cardiovascular: Negative for chest pain.  Gastrointestinal: Negative for abdominal pain and blood in stool.  Genitourinary: Negative for dysuria and flank pain.  Musculoskeletal: Negative for back pain.  Skin: Negative for rash.  Neurological: Positive for dizziness and light-headedness. Negative for headaches.  Hematological: Does not bruise/bleed easily.  Psychiatric/Behavioral: Negative for confusion.     Physical Exam Updated Vital Signs BP 126/76 (BP Location: Left Arm)   Pulse 100   Temp 98.3 F (36.8  C) (Oral)   Resp 18   SpO2 100%   Physical Exam  Constitutional: She appears well-developed and well-nourished. No distress.  HENT:  Head: Atraumatic.  Mouth/Throat: Oropharynx is clear and moist.  Eyes: Conjunctivae are normal. Pupils are equal, round, and reactive to light. No scleral icterus.  Neck: Neck supple. No tracheal deviation present. No thyromegaly present.  Cardiovascular: Normal rate, regular rhythm, normal heart sounds and intact distal pulses.  Exam reveals no gallop.   No murmur heard. Pulmonary/Chest: Effort normal and breath sounds normal. No respiratory distress.  Abdominal: Soft. Normal appearance. She exhibits no distension. There is no tenderness.    Genitourinary:  Genitourinary Comments: No cva tenderness  Musculoskeletal: She exhibits no edema.  Neurological: She is alert.  Skin: Skin is warm and dry. No rash noted. She is not diaphoretic.  Psychiatric: She has a normal mood and affect.  Nursing note and vitals reviewed.    ED Treatments / Results  Labs (all labs ordered are listed, but only abnormal results are displayed) Results for orders placed or performed during the hospital encounter of 84/16/60  Basic metabolic panel  Result Value Ref Range   Sodium 138 135 - 145 mmol/L   Potassium 3.7 3.5 - 5.1 mmol/L   Chloride 105 101 - 111 mmol/L   CO2 26 22 - 32 mmol/L   Glucose, Bld 120 (H) 65 - 99 mg/dL   BUN 8 6 - 20 mg/dL   Creatinine, Ser 0.81 0.44 - 1.00 mg/dL   Calcium 9.1 8.9 - 10.3 mg/dL   GFR calc non Af Amer >60 >60 mL/min   GFR calc Af Amer >60 >60 mL/min   Anion gap 7 5 - 15  CBC  Result Value Ref Range   WBC 5.0 4.0 - 10.5 K/uL   RBC 4.13 3.87 - 5.11 MIL/uL   Hemoglobin 11.1 (L) 12.0 - 15.0 g/dL   HCT 34.0 (L) 36.0 - 46.0 %   MCV 82.3 78.0 - 100.0 fL   MCH 26.9 26.0 - 34.0 pg   MCHC 32.6 30.0 - 36.0 g/dL   RDW 15.0 11.5 - 15.5 %   Platelets 431 (H) 150 - 400 K/uL  I-Stat beta hCG blood, ED  Result Value Ref Range   I-stat hCG, quantitative <5.0 <5 mIU/mL   Comment 3           Dg Chest 2 View  Result Date: 08/18/2016 CLINICAL DATA:  Dull left-sided chest pain with dyspnea. EXAM: CHEST  2 VIEW COMPARISON:  07/07/2016 FINDINGS: The heart size and mediastinal contours are within normal limits. Both lungs are clear. The visualized skeletal structures are unremarkable. IMPRESSION: No active cardiopulmonary disease. Electronically Signed   By: Ashley Royalty M.D.   On: 08/18/2016 22:09    EKG  EKG Interpretation  Date/Time:  Sunday Sep 10 2016 17:11:14 EDT Ventricular Rate:  90 PR Interval:    QRS Duration: 72 QT Interval:  336 QTC Calculation: 412 R Axis:   54 Text Interpretation:  Sinus rhythm Low  voltage, precordial leads Baseline wander in lead(s) V5 No significant change since last tracing Confirmed by Ashok Cordia  MD, Lennette Bihari ([id number]) on 09/10/2016 6:18:52 PM       Radiology No results found.  Procedures Procedures (including critical care time)  Medications Ordered in ED Medications - No data to display   Initial Impression / Assessment and Plan / ED Course  I have reviewed the triage vital signs and the nursing notes.  Pertinent labs &  imaging results that were available during my care of the patient were reviewed by me and considered in my medical decision making (see chart for details).  Labs sent.  Discussed labs w pt.  Pt in nsr. No symptoms currently.  Po fluids.  Pt currently appears stable for d/c.   rec pcp f/u.     Final Clinical Impressions(s) / ED Diagnoses   Final diagnoses:  None    New Prescriptions New Prescriptions   No medications on file     Lajean Saver, MD 09/10/16 5208

## 2016-09-12 ENCOUNTER — Telehealth: Payer: Self-pay | Admitting: Nurse Practitioner

## 2016-09-12 NOTE — Telephone Encounter (Signed)
Cecille Rubin, patient has declined to have Echo done at this time per note on cancelled appt. .  If she decides to have it she will call back. I have removed the order from the workque.   Thanks [employer]

## 2016-10-20 ENCOUNTER — Observation Stay (HOSPITAL_COMMUNITY)
Admission: EM | Admit: 2016-10-20 | Discharge: 2016-10-21 | DRG: 340 | Disposition: A | Discharge status: Home / Self Care | Payer: Managed Care, Other (non HMO) | Attending: General Surgery | Admitting: General Surgery

## 2016-10-20 ENCOUNTER — Other Ambulatory Visit: Payer: Self-pay | Admitting: Gastroenterology

## 2016-10-20 ENCOUNTER — Encounter (HOSPITAL_COMMUNITY)
Admission: EM | Disposition: A | Discharge status: Home / Self Care | Payer: Self-pay | Source: Home / Self Care | Attending: Emergency Medicine

## 2016-10-20 ENCOUNTER — Encounter (HOSPITAL_COMMUNITY): Payer: Self-pay | Admitting: *Deleted

## 2016-10-20 ENCOUNTER — Emergency Department (HOSPITAL_COMMUNITY): Payer: Managed Care, Other (non HMO) | Admitting: Certified Registered"

## 2016-10-20 ENCOUNTER — Ambulatory Visit
Admission: RE | Admit: 2016-10-20 | Discharge: 2016-10-20 | Disposition: A | Discharge status: Home / Self Care | Payer: Managed Care, Other (non HMO) | Source: Ambulatory Visit | Attending: Gastroenterology | Admitting: Gastroenterology

## 2016-10-20 DIAGNOSIS — D509 Iron deficiency anemia, unspecified: Secondary | ICD-10-CM | POA: Insufficient documentation

## 2016-10-20 DIAGNOSIS — K353 Acute appendicitis with localized peritonitis, without perforation or gangrene: Secondary | ICD-10-CM

## 2016-10-20 DIAGNOSIS — Z923 Personal history of irradiation: Secondary | ICD-10-CM | POA: Insufficient documentation

## 2016-10-20 DIAGNOSIS — F419 Anxiety disorder, unspecified: Secondary | ICD-10-CM | POA: Diagnosis not present

## 2016-10-20 DIAGNOSIS — Z885 Allergy status to narcotic agent status: Secondary | ICD-10-CM | POA: Insufficient documentation

## 2016-10-20 DIAGNOSIS — R1031 Right lower quadrant pain: Secondary | ICD-10-CM

## 2016-10-20 DIAGNOSIS — Z79899 Other long term (current) drug therapy: Secondary | ICD-10-CM | POA: Insufficient documentation

## 2016-10-20 DIAGNOSIS — K3580 Unspecified acute appendicitis: Secondary | ICD-10-CM | POA: Diagnosis present

## 2016-10-20 DIAGNOSIS — R109 Unspecified abdominal pain: Secondary | ICD-10-CM | POA: Diagnosis present

## 2016-10-20 HISTORY — PX: LAPAROSCOPIC APPENDECTOMY: SHX408

## 2016-10-20 LAB — COMPREHENSIVE METABOLIC PANEL
ALT: 23 U/L (ref 14–54)
AST: 26 U/L (ref 15–41)
Albumin: 4.1 g/dL (ref 3.5–5.0)
Alkaline Phosphatase: 94 U/L (ref 38–126)
Anion gap: 8 (ref 5–15)
BUN: 6 mg/dL (ref 6–20)
CO2: 25 mmol/L (ref 22–32)
Calcium: 9.2 mg/dL (ref 8.9–10.3)
Chloride: 102 mmol/L (ref 101–111)
Creatinine, Ser: 0.79 mg/dL (ref 0.44–1.00)
GFR calc Af Amer: >60 mL/min (ref >60)
GFR calc non Af Amer: >60 mL/min (ref >60)
Glucose, Bld: 85 mg/dL (ref 65–99)
Potassium: 3.7 mmol/L (ref 3.5–5.1)
Sodium: 135 mmol/L (ref 135–145)
Total Bilirubin: 0.2 mg/dL — ABNORMAL LOW (ref 0.3–1.2)
Total Protein: 7.5 g/dL (ref 6.5–8.1)

## 2016-10-20 LAB — CBC WITH DIFFERENTIAL/PLATELET
Basophils Absolute: 0 10*3/uL (ref 0.0–0.1)
Basophils Relative: 0 %
Eosinophils Absolute: 0.1 10*3/uL (ref 0.0–0.7)
Eosinophils Relative: 2 %
HCT: 33.7 % — ABNORMAL LOW (ref 36.0–46.0)
Hemoglobin: 10.7 g/dL — ABNORMAL LOW (ref 12.0–15.0)
Lymphocytes Relative: 50 %
Lymphs Abs: 2.8 10*3/uL (ref 0.7–4.0)
MCH: 26.2 pg (ref 26.0–34.0)
MCHC: 31.8 g/dL (ref 30.0–36.0)
MCV: 82.6 fL (ref 78.0–100.0)
Monocytes Absolute: 0.3 10*3/uL (ref 0.1–1.0)
Monocytes Relative: 5 %
Neutro Abs: 2.5 10*3/uL (ref 1.7–7.7)
Neutrophils Relative %: 43 %
Platelets: 355 10*3/uL (ref 150–400)
RBC: 4.08 MIL/uL (ref 3.87–5.11)
RDW: 14.8 % (ref 11.5–15.5)
WBC: 5.8 10*3/uL (ref 4.0–10.5)

## 2016-10-20 LAB — PREGNANCY, URINE: Preg Test, Ur: NEGATIVE

## 2016-10-20 LAB — URINALYSIS, ROUTINE W REFLEX MICROSCOPIC
Bacteria, UA: NONE SEEN
Bilirubin Urine: NEGATIVE
Glucose, UA: NEGATIVE mg/dL
Ketones, ur: NEGATIVE mg/dL
Leukocytes, UA: NEGATIVE
Nitrite: NEGATIVE
Protein, ur: NEGATIVE mg/dL
Specific Gravity, Urine: >1.046 — ABNORMAL HIGH (ref 1.005–1.030)
pH: 5 (ref 5.0–8.0)

## 2016-10-20 LAB — LIPASE, BLOOD: Lipase: 21 U/L (ref 11–51)

## 2016-10-20 SURGERY — APPENDECTOMY, LAPAROSCOPIC
Anesthesia: General | Site: Abdomen

## 2016-10-20 MED ORDER — SODIUM CHLORIDE 0.9 % IV BOLUS (SEPSIS)
1000.0000 mL | Freq: Once | INTRAVENOUS | Status: AC
  Administered 2016-10-20: 1000 mL via INTRAVENOUS

## 2016-10-20 MED ORDER — HYDROMORPHONE HCL 1 MG/ML IJ SOLN: 0.5000 mg | INTRAMUSCULAR | Status: DC | PRN

## 2016-10-20 MED ORDER — PROMETHAZINE HCL 25 MG/ML IJ SOLN: 6.2500 mg | INTRAMUSCULAR | Status: DC | PRN

## 2016-10-20 MED ORDER — LIDOCAINE 2% (20 MG/ML) 5 ML SYRINGE
INTRAMUSCULAR | Status: AC
  Filled 2016-10-20: qty 5

## 2016-10-20 MED ORDER — POLYSACCHARIDE IRON COMPLEX 150 MG PO CAPS
150.0000 mg | ORAL_CAPSULE | Freq: Every day | ORAL | Status: DC
Start: 2016-10-21 — End: 2016-10-21
  Administered 2016-10-21: 150 mg via ORAL
  Filled 2016-10-20: qty 1

## 2016-10-20 MED ORDER — PROPOFOL 10 MG/ML IV BOLUS
INTRAVENOUS | Status: AC
  Filled 2016-10-20: qty 20

## 2016-10-20 MED ORDER — HYDROMORPHONE HCL 1 MG/ML IJ SOLN
INTRAMUSCULAR | Status: AC
Start: 2016-10-20 — End: 2016-10-21
  Filled 2016-10-20: qty 0.5

## 2016-10-20 MED ORDER — SUGAMMADEX SODIUM 500 MG/5ML IV SOLN
INTRAVENOUS | Status: AC
  Filled 2016-10-20: qty 5

## 2016-10-20 MED ORDER — SUCCINYLCHOLINE CHLORIDE 20 MG/ML IJ SOLN
INTRAMUSCULAR | Status: DC | PRN
  Administered 2016-10-20: 100 mg via INTRAVENOUS

## 2016-10-20 MED ORDER — 0.9 % SODIUM CHLORIDE (POUR BTL) OPTIME
TOPICAL | Status: DC | PRN
  Administered 2016-10-20: 1000 mL

## 2016-10-20 MED ORDER — SUGAMMADEX SODIUM 500 MG/5ML IV SOLN
INTRAVENOUS | Status: DC | PRN
  Administered 2016-10-20: 300 mg via INTRAVENOUS

## 2016-10-20 MED ORDER — SODIUM CHLORIDE 0.9 % IV SOLN
INTRAVENOUS | Status: DC
  Administered 2016-10-20: 22:00:00 via INTRAVENOUS

## 2016-10-20 MED ORDER — SODIUM CHLORIDE 0.9 % IR SOLN
Status: DC | PRN
  Administered 2016-10-20: 1000 mL

## 2016-10-20 MED ORDER — CEFTRIAXONE SODIUM 2 G IJ SOLR
2.0000 g | INTRAMUSCULAR | Status: AC
  Filled 2016-10-20: qty 2

## 2016-10-20 MED ORDER — ONDANSETRON HCL 4 MG/2ML IJ SOLN: 4.0000 mg | Freq: Four times a day (QID) | INTRAMUSCULAR | Status: DC | PRN

## 2016-10-20 MED ORDER — HYDROCODONE-ACETAMINOPHEN 5-325 MG PO TABS
1.0000 | ORAL_TABLET | ORAL | Status: DC | PRN
  Administered 2016-10-20 – 2016-10-21 (×2): 2 via ORAL
  Filled 2016-10-20 (×3): qty 2

## 2016-10-20 MED ORDER — MIDAZOLAM HCL 5 MG/5ML IJ SOLN
INTRAMUSCULAR | Status: DC | PRN
  Administered 2016-10-20: 2 mg via INTRAVENOUS

## 2016-10-20 MED ORDER — ENOXAPARIN SODIUM 40 MG/0.4ML ~~LOC~~ SOLN: 40.0000 mg | SUBCUTANEOUS | Status: DC

## 2016-10-20 MED ORDER — MIDAZOLAM HCL 2 MG/2ML IJ SOLN: 0.5000 mg | Freq: Once | INTRAMUSCULAR | Status: DC | PRN

## 2016-10-20 MED ORDER — SUCCINYLCHOLINE CHLORIDE 200 MG/10ML IV SOSY
PREFILLED_SYRINGE | INTRAVENOUS | Status: AC
  Filled 2016-10-20: qty 10

## 2016-10-20 MED ORDER — MIDAZOLAM HCL 2 MG/2ML IJ SOLN
INTRAMUSCULAR | Status: AC
  Filled 2016-10-20: qty 2

## 2016-10-20 MED ORDER — PROPOFOL 10 MG/ML IV BOLUS
INTRAVENOUS | Status: DC | PRN
Start: 2016-10-20 — End: 2016-10-20
  Administered 2016-10-20: 200 mg via INTRAVENOUS

## 2016-10-20 MED ORDER — LIDOCAINE HCL (CARDIAC) 20 MG/ML IV SOLN
INTRAVENOUS | Status: DC | PRN
  Administered 2016-10-20: 40 mg via INTRAVENOUS

## 2016-10-20 MED ORDER — DIPHENHYDRAMINE HCL 50 MG/ML IJ SOLN
INTRAMUSCULAR | Status: AC
  Filled 2016-10-20: qty 1

## 2016-10-20 MED ORDER — DIPHENHYDRAMINE HCL 25 MG PO CAPS
50.0000 mg | ORAL_CAPSULE | Freq: Once | ORAL | Status: AC
  Administered 2016-10-20: 50 mg via ORAL
  Filled 2016-10-20: qty 2

## 2016-10-20 MED ORDER — ONDANSETRON HCL 4 MG/2ML IJ SOLN
INTRAMUSCULAR | Status: AC
  Filled 2016-10-20: qty 2

## 2016-10-20 MED ORDER — PHENYLEPHRINE HCL 10 MG/ML IJ SOLN
INTRAMUSCULAR | Status: DC | PRN
  Administered 2016-10-20 (×2): 80 ug via INTRAVENOUS
  Administered 2016-10-20: 40 ug via INTRAVENOUS

## 2016-10-20 MED ORDER — FENTANYL CITRATE (PF) 250 MCG/5ML IJ SOLN
INTRAMUSCULAR | Status: AC
  Filled 2016-10-20: qty 5

## 2016-10-20 MED ORDER — DEXAMETHASONE SODIUM PHOSPHATE 10 MG/ML IJ SOLN
INTRAMUSCULAR | Status: DC | PRN
  Administered 2016-10-20: 10 mg via INTRAVENOUS

## 2016-10-20 MED ORDER — DEXAMETHASONE SODIUM PHOSPHATE 10 MG/ML IJ SOLN
INTRAMUSCULAR | Status: AC
  Filled 2016-10-20: qty 1

## 2016-10-20 MED ORDER — BUPIVACAINE-EPINEPHRINE 0.25% -1:200000 IJ SOLN
INTRAMUSCULAR | Status: DC | PRN
  Administered 2016-10-20: 16 mL

## 2016-10-20 MED ORDER — ROCURONIUM BROMIDE 10 MG/ML (PF) SYRINGE
PREFILLED_SYRINGE | INTRAVENOUS | Status: AC
  Filled 2016-10-20: qty 5

## 2016-10-20 MED ORDER — HYDROMORPHONE HCL 1 MG/ML IJ SOLN
INTRAMUSCULAR | Status: AC
  Administered 2016-10-20: 0.5 mg via INTRAVENOUS
  Filled 2016-10-20: qty 0.5

## 2016-10-20 MED ORDER — FENTANYL CITRATE (PF) 100 MCG/2ML IJ SOLN: INTRAMUSCULAR | Status: DC | PRN

## 2016-10-20 MED ORDER — ONDANSETRON HCL 4 MG/2ML IJ SOLN
INTRAMUSCULAR | Status: DC | PRN
  Administered 2016-10-20: 4 mg via INTRAVENOUS

## 2016-10-20 MED ORDER — DIPHENHYDRAMINE HCL 50 MG/ML IJ SOLN
INTRAMUSCULAR | Status: DC | PRN
  Administered 2016-10-20: 12.5 mg via INTRAVENOUS

## 2016-10-20 MED ORDER — ROCURONIUM BROMIDE 100 MG/10ML IV SOLN
INTRAVENOUS | Status: DC | PRN
  Administered 2016-10-20: 40 mg via INTRAVENOUS

## 2016-10-20 MED ORDER — MEPERIDINE HCL 25 MG/ML IJ SOLN: 6.2500 mg | INTRAMUSCULAR | Status: DC | PRN

## 2016-10-20 MED ORDER — BUPIVACAINE-EPINEPHRINE (PF) 0.25% -1:200000 IJ SOLN
INTRAMUSCULAR | Status: AC
  Filled 2016-10-20: qty 30

## 2016-10-20 MED ORDER — FENTANYL CITRATE (PF) 100 MCG/2ML IJ SOLN
INTRAMUSCULAR | Status: DC | PRN
  Administered 2016-10-20: 50 ug via INTRAVENOUS
  Administered 2016-10-20: 100 ug via INTRAVENOUS

## 2016-10-20 MED ORDER — IOPAMIDOL (ISOVUE-300) INJECTION 61%
100.0000 mL | Freq: Once | INTRAVENOUS | Status: AC | PRN
  Administered 2016-10-20: 100 mL via INTRAVENOUS

## 2016-10-20 MED ORDER — POLYETHYLENE GLYCOL 3350 17 G PO PACK: 17.0000 g | PACK | Freq: Every day | ORAL | Status: DC | PRN

## 2016-10-20 MED ORDER — FENTANYL CITRATE (PF) 100 MCG/2ML IJ SOLN
INTRAMUSCULAR | Status: AC
  Filled 2016-10-20: qty 2

## 2016-10-20 MED ORDER — PHENYLEPHRINE 40 MCG/ML (10ML) SYRINGE FOR IV PUSH (FOR BLOOD PRESSURE SUPPORT)
PREFILLED_SYRINGE | INTRAVENOUS | Status: AC
  Filled 2016-10-20: qty 10

## 2016-10-20 MED ORDER — HYDROMORPHONE HCL 1 MG/ML IJ SOLN
0.2500 mg | INTRAMUSCULAR | Status: DC | PRN
  Administered 2016-10-20 (×3): 0.5 mg via INTRAVENOUS

## 2016-10-20 MED ORDER — LACTATED RINGERS IV SOLN
INTRAVENOUS | Status: DC
  Administered 2016-10-20 (×2): via INTRAVENOUS

## 2016-10-20 MED ORDER — ONDANSETRON 4 MG PO TBDP: 4.0000 mg | ORAL_TABLET | Freq: Four times a day (QID) | ORAL | Status: DC | PRN

## 2016-10-20 SURGICAL SUPPLY — 40 items
APPLIER CLIP ROT 10 11.4 M/L (STAPLE)
BLADE CLIPPER SURG (BLADE) IMPLANT
CANISTER SUCT 3000ML PPV (MISCELLANEOUS) ×3 IMPLANT
CHLORAPREP W/TINT 26ML (MISCELLANEOUS) ×3 IMPLANT
CLIP APPLIE ROT 10 11.4 M/L (STAPLE) IMPLANT
CLOSURE WOUND 1/2 X4 (GAUZE/BANDAGES/DRESSINGS) ×1
COVER SURGICAL LIGHT HANDLE (MISCELLANEOUS) ×3 IMPLANT
CUTTER FLEX LINEAR 45M (STAPLE) ×3 IMPLANT
DERMABOND ADVANCED (GAUZE/BANDAGES/DRESSINGS) ×2
DERMABOND ADVANCED .7 DNX12 (GAUZE/BANDAGES/DRESSINGS) ×1 IMPLANT
DRSG TEGADERM 2-3/8X2-3/4 SM (GAUZE/BANDAGES/DRESSINGS) ×9 IMPLANT
ELECT REM PT RETURN 9FT ADLT (ELECTROSURGICAL) ×3
ELECTRODE REM PT RTRN 9FT ADLT (ELECTROSURGICAL) ×1 IMPLANT
ENDOLOOP SUT PDS II  0 18 (SUTURE)
ENDOLOOP SUT PDS II 0 18 (SUTURE) IMPLANT
GLOVE BIOGEL PI IND STRL 8 (GLOVE) ×1 IMPLANT
GLOVE BIOGEL PI INDICATOR 8 (GLOVE) ×2
GLOVE ECLIPSE 7.5 STRL STRAW (GLOVE) ×3 IMPLANT
GOWN STRL REUS W/ TWL LRG LVL3 (GOWN DISPOSABLE) ×3 IMPLANT
GOWN STRL REUS W/TWL LRG LVL3 (GOWN DISPOSABLE) ×6
KIT BASIN OR (CUSTOM PROCEDURE TRAY) ×3 IMPLANT
KIT ROOM TURNOVER OR (KITS) ×3 IMPLANT
NS IRRIG 1000ML POUR BTL (IV SOLUTION) ×3 IMPLANT
PAD ARMBOARD 7.5X6 YLW CONV (MISCELLANEOUS) ×6 IMPLANT
POUCH SPECIMEN RETRIEVAL 10MM (ENDOMECHANICALS) ×3 IMPLANT
RELOAD 45 VASCULAR/THIN (ENDOMECHANICALS) IMPLANT
RELOAD STAPLE TA45 3.5 REG BLU (ENDOMECHANICALS) IMPLANT
SET IRRIG TUBING LAPAROSCOPIC (IRRIGATION / IRRIGATOR) ×3 IMPLANT
SHEARS HARMONIC ACE PLUS 36CM (ENDOMECHANICALS) ×3 IMPLANT
SLEEVE ENDOPATH XCEL 5M (ENDOMECHANICALS) ×3 IMPLANT
SPECIMEN JAR SMALL (MISCELLANEOUS) ×3 IMPLANT
STRIP CLOSURE SKIN 1/2X4 (GAUZE/BANDAGES/DRESSINGS) ×2 IMPLANT
SUT MNCRL AB 4-0 PS2 18 (SUTURE) ×3 IMPLANT
TOWEL OR 17X24 6PK STRL BLUE (TOWEL DISPOSABLE) ×3 IMPLANT
TOWEL OR 17X26 10 PK STRL BLUE (TOWEL DISPOSABLE) ×3 IMPLANT
TRAY FOLEY CATH SILVER 16FR (SET/KITS/TRAYS/PACK) ×3 IMPLANT
TRAY LAPAROSCOPIC MC (CUSTOM PROCEDURE TRAY) ×3 IMPLANT
TROCAR XCEL BLUNT TIP 100MML (ENDOMECHANICALS) ×3 IMPLANT
TROCAR XCEL NON-BLD 5MMX100MML (ENDOMECHANICALS) ×3 IMPLANT
TUBING INSUFFLATION (TUBING) ×3 IMPLANT

## 2016-10-20 NOTE — ED Provider Notes (Signed)
[facility] DEPT Provider Note   CSN: [id number] Arrival date & time: 10/20/16  1516     History   Chief Complaint No chief complaint on file.   HPI Caitlyn Roy is a 31 y.o. female.   Abdominal Pain   This is a new problem. The current episode started yesterday. The problem occurs constantly. The problem has been gradually worsening. The pain is located in the RLQ and LLQ. The pain is moderate. Associated symptoms include nausea. Pertinent negatives include anorexia, fever, diarrhea, vomiting, constipation, dysuria and frequency. The symptoms are aggravated by palpation. Nothing relieves the symptoms. Past workup includes CT scan (showed appendicitis on CT today).    Past Medical History:  Diagnosis Date  . Anemia   . Anxiety   . 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 Tubal pregnancy   . Vaginal odor   . Vaginitis     Patient Active Problem List   Diagnosis Date Noted  . Acute appendicitis 10/20/2016  . Iron deficiency anemia 03/03/2016  . Palpitations 03/03/2016  . BV (bacterial vaginosis) 10/15/2014    Past Surgical History:  Procedure Laterality Date  . BIOPSY EYE MUSCLE    . CESAREAN SECTION  2010  . laporoscopy      OB History    Gravida Para Term Preterm AB Living   7 1 1   6 1    SAB TAB Ectopic Multiple Live Births   5   1   1        Home Medications    Prior to Admission medications   Medication Sig Start Date End Date Taking? Authorizing Provider  Cholecalciferol (VITAMIN D-3) 5000 units TABS Take 5,000 Units by mouth daily.   Yes [provider]  iron polysaccharides (NIFEREX) 150 MG capsule Take 150 mg by mouth daily.   Yes [provider]  Multiple Vitamin (MULTIVITAMIN WITH MINERALS) TABS tablet Take 1 tablet by mouth daily.   Yes [provider]    Family History Family History  Problem Relation Age of Onset  . Hypertension Mother   .  Breast cancer Mother   . Heart attack Mother   . Alcohol abuse Father     Social History Social History  Substance Use Topics  . Smoking status: Never Smoker  . Smokeless tobacco: Never Used  . Alcohol use 1.2 oz/week    2 Glasses of wine per week     Comment: occ     Allergies   Oxycodone   Review of Systems Review of Systems  Constitutional: Negative for fever.  Gastrointestinal: Positive for abdominal pain and nausea. Negative for anorexia, constipation, diarrhea and vomiting.  Genitourinary: Negative for dysuria and frequency.  All other systems reviewed and are negative.    Physical Exam Updated Vital Signs BP 117/74 (BP Location: Left Arm)   Pulse (!) 118   Temp 99.3 F (37.4 C) (Oral)   Resp 19   Ht 5\' 2"  (1.575 m)   Wt 71.7 kg (158 lb)   LMP 09/27/2016   SpO2 94%   BMI 28.90 kg/m   Physical Exam  Constitutional: She is oriented to person, place, and time. She appears well-developed and well-nourished.  HENT:  Head: Normocephalic and atraumatic.  Eyes: Conjunctivae and EOM are normal.  Neck: Normal range of motion.  Cardiovascular: Regular rhythm.  Tachycardia present.   Pulmonary/Chest: Effort normal and breath sounds normal. No stridor. No respiratory distress.  Abdominal: Soft. She  exhibits no distension. There is tenderness.  Musculoskeletal: Normal range of motion. She exhibits no edema or deformity.  Neurological: She is alert and oriented to person, place, and time. No cranial nerve deficit.  Skin: Skin is warm and dry.  Nursing note and vitals reviewed.    ED Treatments / Results  Labs (all labs ordered are listed, but only abnormal results are displayed) Labs Reviewed  CBC WITH DIFFERENTIAL/PLATELET - Abnormal; Notable for the following:       Result Value   Hemoglobin 10.7 (*)    HCT 33.7 (*)    All other components within normal limits  COMPREHENSIVE METABOLIC PANEL - Abnormal; Notable for the following:    Total Bilirubin 0.2 (*)     All other components within normal limits  URINALYSIS, ROUTINE W REFLEX MICROSCOPIC - Abnormal; Notable for the following:    Color, Urine STRAW (*)    Specific Gravity, Urine >1.046 (*)    Hgb urine dipstick SMALL (*)    Squamous Epithelial / LPF 0-5 (*)    All other components within normal limits  LIPASE, BLOOD  PREGNANCY, URINE  SURGICAL PATHOLOGY    EKG  EKG Interpretation None       Radiology Ct Abdomen Pelvis W Contrast  Result Date: 10/20/2016 CLINICAL DATA:  Right lower quadrant in addition to generalized abdominal pain x1 day. EXAM: CT ABDOMEN AND PELVIS WITH CONTRAST TECHNIQUE: Multidetector CT imaging of the abdomen and pelvis was performed using the standard protocol following bolus administration of intravenous contrast. CONTRAST:  159mL ISOVUE-300 IOPAMIDOL (ISOVUE-300) INJECTION 61% COMPARISON:  None. FINDINGS: Lower chest: No acute abnormality. Hepatobiliary: 12 mm low-density stone within a contracted gallbladder. No wall thickening or pericholecystic fluid. The liver enhances homogeneously without space-occupying mass. No biliary dilatation. Pancreas: Negative Spleen: Negative Adrenals/Urinary Tract: Negative Stomach/Bowel: Contrast distention of the stomach with normal small bowel rotation. No small bowel obstruction or inflammation. The appendix is mildly distended at 8 -9 mm in caliber with diffuse mild thickening of the appendix along its entirety. Faint periappendiceal fatty streaking induration is noted, series 3, image 46. A moderate amount of fecal retention is seen within large bowel. No large bowel obstruction or inflammation. A few scattered colonic diverticular are noted along the left colon. Vascular/Lymphatic: No significant vascular findings are present. No enlarged abdominal or pelvic lymph nodes. Reproductive: Enhancing cyst or follicle in the right ovary move measuring 1.5 cm in diameter consistent with a corpus luteal cyst or hemorrhagic cyst. The left  ovary is unremarkable. Heterogeneous enhancement of the uterus with subtle hypodensities anteriorly which may reflect small subserosal fibroids. Nabothian cysts are seen at the cervix. Other: Trace physiologic free fluid in the cul-de-sac. Musculoskeletal: No acute or significant osseous findings. IMPRESSION: 1. Mild distention and thickened appearance of the appendix along its entirety suspicious for early changes of an acute appendicitis. 2. Small corpus luteal cyst of the right ovary measuring 1.5 cm. 3. Uncomplicated cholelithiasis. Electronically Signed   By: Ashley Royalty M.D.   On: 10/20/2016 14:19    Procedures Procedures (including critical care time)  Medications Ordered in ED Medications  ondansetron (ZOFRAN) 4 MG/2ML injection (not administered)  fentaNYL (SUBLIMAZE) 100 MCG/2ML injection (not administered)  cefTRIAXone (ROCEPHIN) 2 g in dextrose 5 % 50 mL IVPB ( Intravenous MAR Unhold 10/20/16 2123)  lactated ringers infusion ( Intravenous Anesthesia Volume Adjustment 10/20/16 2000)  iron polysaccharides (NIFEREX) capsule 150 mg (not administered)  enoxaparin (LOVENOX) injection 40 mg (not administered)  0.9 %  sodium chloride infusion ( Intravenous New Bag/Given 10/20/16 2157)  HYDROmorphone (DILAUDID) injection 0.5-1 mg (not administered)  HYDROcodone-acetaminophen (NORCO/VICODIN) 5-325 MG per tablet 1-2 tablet (2 tablets Oral Given 10/20/16 2150)  ondansetron (ZOFRAN-ODT) disintegrating tablet 4 mg (not administered)    Or  ondansetron (ZOFRAN) injection 4 mg (not administered)  polyethylene glycol (MIRALAX / GLYCOLAX) packet 17 g (not administered)  HYDROmorphone (DILAUDID) 1 MG/ML injection (not administered)  sodium chloride 0.9 % bolus 1,000 mL (1,000 mLs Intravenous New Bag/Given 10/20/16 1629)  diphenhydrAMINE (BENADRYL) capsule 50 mg (50 mg Oral Given 10/20/16 2325)     Initial Impression / Assessment and Plan / ED Course  I have reviewed the triage vital signs and the  nursing notes.  Pertinent labs & imaging results that were available during my care of the patient were reviewed by me and considered in my medical decision making (see chart for details).    Appendicitis on CT. Will get labs/urine. Surgery consult.   Surgery to admit for OR.  Final Clinical Impressions(s) / ED Diagnoses   Final diagnoses:  Acute appendicitis with localized peritonitis      Brehanna Deveny, Corene Cornea, MD 10/21/16 5997

## 2016-10-20 NOTE — Anesthesia Preprocedure Evaluation (Addendum)
Anesthesia Evaluation  Patient identified by MRN, date of birth, ID band Patient awake    Reviewed: Allergy & Precautions, NPO status , Patient's Chart, lab work & pertinent test results  History of Anesthesia Complications Negative for: history of anesthetic complications  Airway Mallampati: I  TM Distance: >3 FB Neck ROM: Full    Dental  (+) Dental Advisory Given   Pulmonary neg pulmonary ROS,    breath sounds clear to auscultation       Cardiovascular negative cardio ROS   Rhythm:Regular Rate:Normal     Neuro/Psych H/o orbital pseudotumor 2014: XRT Last MRI brain without focal abnormalities    GI/Hepatic Neg liver ROS, Nausea and abdominal pain with acute appy   Endo/Other  negative endocrine ROS  Renal/GU Renal disease: 10.7.negative Renal ROS     Musculoskeletal negative musculoskeletal ROS (+)   Abdominal   Peds  Hematology  (+) Blood dyscrasia (Hb 10.7, takes iron supplements), anemia ,   Anesthesia Other Findings   Reproductive/Obstetrics                            Anesthesia Physical Anesthesia Plan  ASA: II  Anesthesia Plan: General   Post-op Pain Management:    Induction: Intravenous and Rapid sequence  PONV Risk Score and Plan: 4 or greater and Ondansetron, Dexamethasone, Propofol, Midazolam and Diphenhydramine  Airway Management Planned: Oral ETT  Additional Equipment:   Intra-op Plan:   Post-operative Plan: Extubation in OR  Informed Consent: I have reviewed the patients History and Physical, chart, labs and discussed the procedure including the risks, benefits and alternatives for the proposed anesthesia with the patient or authorized representative who has indicated his/her understanding and acceptance.   Dental advisory given  Plan Discussed with: CRNA and Surgeon  Anesthesia Plan Comments: (Plan routine monitors, GETA)        Anesthesia Quick  Evaluation

## 2016-10-20 NOTE — ED Triage Notes (Signed)
Pt arrives from imaging with known acute appendicitis. Pt states she is having nausea and having severe pain in her right lower abdomen.

## 2016-10-20 NOTE — Op Note (Signed)
OPERATIVE REPORT  DATE OF OPERATION: 10/20/2016  PATIENT:  Caitlyn Roy  31 y.o. female  PRE-OPERATIVE DIAGNOSIS:  Acute appendicitis  POST-OPERATIVE DIAGNOSIS:  Acute appendicitis  INDICATION(S) FOR OPERATION:  Acute appendicitis  FINDINGS:  Dilated, acute retrocecal appendicitis  PROCEDURE:  Procedure(s): APPENDECTOMY LAPAROSCOPIC  SURGEON:  Surgeon(s): Judeth Horn, MD  ASSISTANT: None  ANESTHESIA:   general  COMPLICATIONS:  None  EBL: 10 ml  BLOOD ADMINISTERED: none  DRAINS: none   SPECIMEN:  Source of Specimen:  Appendix  COUNTS CORRECT:  YES  PROCEDURE DETAILS: The patient was taken to the operating room and placed on the table in supine position. After an adequate general endotracheal anesthetic was administered, she was prepped and draped in the usual sterile manner exposing her abdomen.  A proper timeout was performed identifying the patient and procedure to be performed. An infraumbilical midline incision was made using a #15 blade and taken down to the midline fascia. We grabbed the fascia with Coker clamps and incised between the clamps into the peritoneum. We then enlarged opening and passed a pursestring suture of 0 Vicryl around the fascial opening. This secured in place a Hassan cannula which was used to insufflate carbon dioxide gas up to a maximal intra-abdominal pressure of 15 mmHg.  Quadrant 5 mm cannula and a left low quadrant 5 mm cannula pass under direct vision. The patient was placed in Trendelenburg and the left side was tilted down.  The appendix was in the supra lateral retrocecal position was dissected free using a Harmonic Scalpel. The mesentery of the appendix was taken down using the harmonic scalpel. We then came across the base of the appendix using a blue cartridge Endo GIA. There were adhesions in the right lower quadrant and an enlarged uterus but this was not involving with the acute inflammatory process.  Once the appendix was  detached we retreated from the perineal cavity using an Endo Catch bag.  Once the appendix was removed from the perineal cavity were close out the infraumbilical fascial site using the pursestring suture was in place. Electrocautery was used to aid in hemostasis and the mesentery in the right lower quadrant. We aspirated all fluid and gas and removed all cannulas.  The skin at the super umbilical site was closed using a running subcuticular stitch of 4-0 Monocryl. 0.25% Marcaine with epinephrine was injected at all sites. Dermabond Steri-Strips and Tegaderms views complete our dressings. All needle counts, sponge counts, and instrument counts were correct. PATIENT DISPOSITION:  PACU - hemodynamically stable.   Aryaan Persichetti 6/15/20188:08 PM

## 2016-10-20 NOTE — Anesthesia Postprocedure Evaluation (Signed)
Anesthesia Post Note  Patient: Caitlyn Roy  Procedure(s) Performed: Procedure(s) (LRB): APPENDECTOMY LAPAROSCOPIC (N/A)     Patient location during evaluation: PACU Anesthesia Type: General Level of consciousness: awake and alert, patient cooperative and oriented Pain management: pain level controlled Vital Signs Assessment: post-procedure vital signs reviewed and stable Respiratory status: spontaneous breathing, nonlabored ventilation, respiratory function stable and patient connected to nasal cannula oxygen Cardiovascular status: blood pressure returned to baseline and stable Postop Assessment: no signs of nausea or vomiting Anesthetic complications: no    Last Vitals:  Vitals:   10/20/16 2021 10/20/16 2036  BP: 128/67 116/82  Pulse: (!) 110 90  Resp: (!) 26 (!) 24  Temp:      Last Pain:  Vitals:   10/20/16 2036  TempSrc:   PainSc: 9                  [name],E. Payten Beaumier

## 2016-10-20 NOTE — ED Notes (Signed)
Pt sent from imaging, per Dr. Ralene Bathe acute appendicitis. Needs to be NPO, consulted to surgery.

## 2016-10-20 NOTE — ED Notes (Signed)
Pt doesn't want to lock up purse and phone

## 2016-10-20 NOTE — H&P (Signed)
Caitlyn Roy is an 31 y.o. female.   Chief Complaint: Right sided abdominal pain. HPI: Patient with right sided abdominal pain after three days of discomfort.  Nausea and vomiting.  CT diagnosed acute appendicitis.  She is significantly tender on the right side.  Has guarding and rebound.  PSHx tubal pregnancy without rupture or resection.  Past Medical History:  Diagnosis Date  . Anemia   . Anxiety   . 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 Tubal pregnancy   . Vaginal odor   . Vaginitis     Past Surgical History:  Procedure Laterality Date  . BIOPSY EYE MUSCLE    . CESAREAN SECTION  2010  . laporoscopy      Family History  Problem Relation Age of Onset  . Hypertension Mother   . Breast cancer Mother   . Heart attack Mother   . Alcohol abuse Father    Social History:  reports that she has never smoked. She has never used smokeless tobacco. She reports that she drinks about 1.2 oz of alcohol per week . She reports that she does not use drugs.  Allergies:  Allergies  Allergen Reactions  . Oxycodone Shortness Of Breath     (Not in a hospital admission)  Results for orders placed or performed during the hospital encounter of 10/20/16 (from the past 48 hour(s))  CBC with Differential     Status: Abnormal   Collection Time: 10/20/16  3:38 PM  Result Value Ref Range   WBC 5.8 4.0 - 10.5 K/uL   RBC 4.08 3.87 - 5.11 MIL/uL   Hemoglobin 10.7 (L) 12.0 - 15.0 g/dL   HCT 33.7 (L) 36.0 - 46.0 %   MCV 82.6 78.0 - 100.0 fL   MCH 26.2 26.0 - 34.0 pg   MCHC 31.8 30.0 - 36.0 g/dL   RDW 14.8 11.5 - 15.5 %   Platelets 355 150 - 400 K/uL   Neutrophils Relative % 43 %   Neutro Abs 2.5 1.7 - 7.7 K/uL   Lymphocytes Relative 50 %   Lymphs Abs 2.8 0.7 - 4.0 K/uL   Monocytes Relative 5 %   Monocytes Absolute 0.3 0.1 - 1.0 K/uL   Eosinophils Relative 2 %   Eosinophils Absolute 0.1 0.0 - 0.7 K/uL   Basophils Relative  0 %   Basophils Absolute 0.0 0.0 - 0.1 K/uL  Comprehensive metabolic panel     Status: Abnormal   Collection Time: 10/20/16  3:38 PM  Result Value Ref Range   Sodium 135 135 - 145 mmol/L   Potassium 3.7 3.5 - 5.1 mmol/L    Comment: SLIGHT HEMOLYSIS   Chloride 102 101 - 111 mmol/L   CO2 25 22 - 32 mmol/L   Glucose, Bld 85 65 - 99 mg/dL   BUN 6 6 - 20 mg/dL   Creatinine, Ser 0.79 0.44 - 1.00 mg/dL   Calcium 9.2 8.9 - 10.3 mg/dL   Total Protein 7.5 6.5 - 8.1 g/dL   Albumin 4.1 3.5 - 5.0 g/dL   AST 26 15 - 41 U/L   ALT 23 14 - 54 U/L   Alkaline Phosphatase 94 38 - 126 U/L   Total Bilirubin 0.2 (L) 0.3 - 1.2 mg/dL   GFR calc non Af Amer >60 >60 mL/min   GFR calc Af Amer >60 >60 mL/min    Comment: (NOTE) The eGFR has been calculated using the CKD EPI equation. This  calculation has not been validated in all clinical situations. eGFR's persistently <60 mL/min signify possible Chronic Kidney Disease.    Anion gap 8 5 - 15  Lipase, blood     Status: None   Collection Time: 10/20/16  3:38 PM  Result Value Ref Range   Lipase 21 11 - 51 U/L  Urinalysis, Routine w reflex microscopic     Status: Abnormal   Collection Time: 10/20/16  4:04 PM  Result Value Ref Range   Color, Urine STRAW (A) YELLOW   APPearance CLEAR CLEAR   Specific Gravity, Urine >1.046 (H) 1.005 - 1.030   pH 5.0 5.0 - 8.0   Glucose, UA NEGATIVE NEGATIVE mg/dL   Hgb urine dipstick SMALL (A) NEGATIVE   Bilirubin Urine NEGATIVE NEGATIVE   Ketones, ur NEGATIVE NEGATIVE mg/dL   Protein, ur NEGATIVE NEGATIVE mg/dL   Nitrite NEGATIVE NEGATIVE   Leukocytes, UA NEGATIVE NEGATIVE   RBC / HPF 0-5 0 - 5 RBC/hpf   WBC, UA 0-5 0 - 5 WBC/hpf   Bacteria, UA NONE SEEN NONE SEEN   Squamous Epithelial / LPF 0-5 (A) NONE SEEN   Mucous PRESENT    Ct Abdomen Pelvis W Contrast  Result Date: 10/20/2016 CLINICAL DATA:  Right lower quadrant in addition to generalized abdominal pain x1 day. EXAM: CT ABDOMEN AND PELVIS WITH CONTRAST  TECHNIQUE: Multidetector CT imaging of the abdomen and pelvis was performed using the standard protocol following bolus administration of intravenous contrast. CONTRAST:  143m ISOVUE-300 IOPAMIDOL (ISOVUE-300) INJECTION 61% COMPARISON:  None. FINDINGS: Lower chest: No acute abnormality. Hepatobiliary: 12 mm low-density stone within a contracted gallbladder. No wall thickening or pericholecystic fluid. The liver enhances homogeneously without space-occupying mass. No biliary dilatation. Pancreas: Negative Spleen: Negative Adrenals/Urinary Tract: Negative Stomach/Bowel: Contrast distention of the stomach with normal small bowel rotation. No small bowel obstruction or inflammation. The appendix is mildly distended at 8 -9 mm in caliber with diffuse mild thickening of the appendix along its entirety. Faint periappendiceal fatty streaking induration is noted, series 3, image 46. A moderate amount of fecal retention is seen within large bowel. No large bowel obstruction or inflammation. A few scattered colonic diverticular are noted along the left colon. Vascular/Lymphatic: No significant vascular findings are present. No enlarged abdominal or pelvic lymph nodes. Reproductive: Enhancing cyst or follicle in the right ovary move measuring 1.5 cm in diameter consistent with a corpus luteal cyst or hemorrhagic cyst. The left ovary is unremarkable. Heterogeneous enhancement of the uterus with subtle hypodensities anteriorly which may reflect small subserosal fibroids. Nabothian cysts are seen at the cervix. Other: Trace physiologic free fluid in the cul-de-sac. Musculoskeletal: No acute or significant osseous findings. IMPRESSION: 1. Mild distention and thickened appearance of the appendix along its entirety suspicious for early changes of an acute appendicitis. 2. Small corpus luteal cyst of the right ovary measuring 1.5 cm. 3. Uncomplicated cholelithiasis. Electronically Signed   By: DAshley RoyaltyM.D.   On: 10/20/2016 14:19     Review of Systems  Constitutional: Positive for fever. Negative for chills.  Gastrointestinal: Positive for abdominal pain, nausea and vomiting.  All other systems reviewed and are negative.   Blood pressure (!) 102/55, pulse (!) 106, temperature 99.5 F (37.5 C), temperature source Oral, resp. rate 18, height 5' 2"  (1.575 m), weight 71.7 kg (158 lb), SpO2 100 %. Physical Exam  Constitutional: She is oriented to person, place, and time. She appears well-developed and well-nourished.  Cardiovascular: Normal rate and regular rhythm.  Respiratory: Effort normal and breath sounds normal.  GI: Soft. Bowel sounds are normal. She exhibits no distension. There is tenderness in the right lower quadrant. There is rebound, guarding and tenderness at McBurney's point. There is no rigidity.  Musculoskeletal: Normal range of motion.  Neurological: She is alert and oriented to person, place, and time. She has normal reflexes.  Skin: Skin is warm and dry.  Psychiatric: She has a normal mood and affect. Her behavior is normal. Judgment and thought content normal.     Assessment/Plan Acute appendicitis without rupture  OR for laparoscopic appendectomy Antibiotics on call to the OR.  Judeth Horn, MD 10/20/2016, 6:12 PM

## 2016-10-20 NOTE — Transfer of Care (Signed)
Immediate Anesthesia Transfer of Care Note  Patient: Caitlyn Roy  Procedure(s) Performed: Procedure(s): APPENDECTOMY LAPAROSCOPIC (N/A)  Patient Location: PACU  Anesthesia Type:General  Level of Consciousness: awake, alert  and oriented  Airway & Oxygen Therapy: Patient Spontanous Breathing and Patient connected to nasal cannula oxygen  Post-op Assessment: Report given to RN and Post -op Vital signs reviewed and stable  Post vital signs: Reviewed and stable  Last Vitals:  Vitals:   10/20/16 1745 10/20/16 2004  BP:    Pulse: (!) 106   Resp: 18   Temp:  37.3 C    Last Pain:  Vitals:   10/20/16 2004  TempSrc:   PainSc: Asleep         Complications: No apparent anesthesia complications

## 2016-10-20 NOTE — Anesthesia Procedure Notes (Signed)
Procedure Name: Intubation Date/Time: 10/20/2016 6:55 PM Performed by: Manuela Schwartz B Pre-anesthesia Checklist: Patient identified, Emergency Drugs available, Suction available, Patient being monitored and Timeout performed Patient Re-evaluated:Patient Re-evaluated prior to inductionOxygen Delivery Method: Circle system utilized Preoxygenation: Pre-oxygenation with 100% oxygen Intubation Type: IV induction, Rapid sequence and Cricoid Pressure applied Laryngoscope Size: Mac and 3 Grade View: Grade I Tube type: Oral Tube size: 7.0 mm Number of attempts: 1 Airway Equipment and Method: Stylet Placement Confirmation: ETT inserted through vocal cords under direct vision,  positive ETCO2 and breath sounds checked- equal and bilateral Secured at: 21 cm Tube secured with: Tape Dental Injury: Teeth and Oropharynx as per pre-operative assessment

## 2016-10-21 ENCOUNTER — Encounter (HOSPITAL_COMMUNITY): Payer: Self-pay | Admitting: General Surgery

## 2016-10-21 MED ORDER — IBUPROFEN 800 MG PO TABS
800.0000 mg | ORAL_TABLET | Freq: Three times a day (TID) | ORAL | Status: DC
  Filled 2016-10-21: qty 1

## 2016-10-21 MED ORDER — ACETAMINOPHEN 325 MG PO TABS: 650.0000 mg | ORAL_TABLET | Freq: Three times a day (TID) | ORAL | Status: DC

## 2016-10-21 MED ORDER — IBUPROFEN 800 MG PO TABS: 800.0000 mg | ORAL_TABLET | Freq: Three times a day (TID) | ORAL | 0 refills | Status: DC

## 2016-10-21 MED ORDER — POLYETHYLENE GLYCOL 3350 17 G PO PACK: 17.0000 g | PACK | Freq: Every day | ORAL | 0 refills | Status: DC | PRN

## 2016-10-21 MED ORDER — TRAMADOL HCL 50 MG PO TABS: 50.0000 mg | ORAL_TABLET | Freq: Four times a day (QID) | ORAL | 0 refills | Status: DC | PRN

## 2016-10-21 MED ORDER — TRAMADOL HCL 50 MG PO TABS
50.0000 mg | ORAL_TABLET | Freq: Four times a day (QID) | ORAL | Status: DC | PRN
  Administered 2016-10-21: 50 mg via ORAL
  Filled 2016-10-21: qty 1

## 2016-10-21 NOTE — Discharge Summary (Signed)
[facility] Discharge Summary   Patient ID: Caitlyn Roy MRN: [id number] DOB/AGE: 31-15-1987 31 y.o.  Admit date: 10/20/2016 Discharge date: 10/21/2016  Discharge Diagnosis Patient Active Problem List   Diagnosis Date Noted  . Acute appendicitis 10/20/2016  . Iron deficiency anemia 03/03/2016  . Palpitations 03/03/2016  . BV (bacterial vaginosis) 10/15/2014    Imaging: Ct Abdomen Pelvis W Contrast  Result Date: 10/20/2016 CLINICAL DATA:  Right lower quadrant in addition to generalized abdominal pain x1 day. EXAM: CT ABDOMEN AND PELVIS WITH CONTRAST TECHNIQUE: Multidetector CT imaging of the abdomen and pelvis was performed using the standard protocol following bolus administration of intravenous contrast. CONTRAST:  153mL ISOVUE-300 IOPAMIDOL (ISOVUE-300) INJECTION 61% COMPARISON:  None. FINDINGS: Lower chest: No acute abnormality. Hepatobiliary: 12 mm low-density stone within a contracted gallbladder. No wall thickening or pericholecystic fluid. The liver enhances homogeneously without space-occupying mass. No biliary dilatation. Pancreas: Negative Spleen: Negative Adrenals/Urinary Tract: Negative Stomach/Bowel: Contrast distention of the stomach with normal small bowel rotation. No small bowel obstruction or inflammation. The appendix is mildly distended at 8 -9 mm in caliber with diffuse mild thickening of the appendix along its entirety. Faint periappendiceal fatty streaking induration is noted, series 3, image 46. A moderate amount of fecal retention is seen within large bowel. No large bowel obstruction or inflammation. A few scattered colonic diverticular are noted along the left colon. Vascular/Lymphatic: No significant vascular findings are present. No enlarged abdominal or pelvic lymph nodes. Reproductive: Enhancing cyst or follicle in the right ovary move measuring 1.5 cm in diameter consistent with a corpus luteal cyst or hemorrhagic cyst. The left ovary is  unremarkable. Heterogeneous enhancement of the uterus with subtle hypodensities anteriorly which may reflect small subserosal fibroids. Nabothian cysts are seen at the cervix. Other: Trace physiologic free fluid in the cul-de-sac. Musculoskeletal: No acute or significant osseous findings. IMPRESSION: 1. Mild distention and thickened appearance of the appendix along its entirety suspicious for early changes of an acute appendicitis. 2. Small corpus luteal cyst of the right ovary measuring 1.5 cm. 3. Uncomplicated cholelithiasis. Electronically Signed   By: Ashley Royalty M.D.   On: 10/20/2016 14:19    Procedures Dr. Judeth Horn (10/20/16) - Laparoscopic Appendectomy  Hospital Course:  31 y/o female who presented to the ED with abdominal pain. Workup significant for acute uncomplicated appendicitis. She was admitted and underwent the procedure above. Tolerated procedure well and was transferred to the floor.  Diet was advanced as tolerated.  On POD#1, the patient was voiding well, tolerating diet, ambulating well, pain well controlled, vital signs stable, incisions c/d/i and felt stable for discharge home.  Patient will follow up in our office in 2 weeks and knows to call with questions or concerns. She will call to confirm appointment date/time.    Physical Exam: General:  Alert, NAD, pleasant, comfortable Abd:  Soft, ND, mild tenderness, incisions C/D/I  Allergies as of 10/21/2016      Reactions   Oxycodone Shortness Of Breath, Itching      Medication List    TAKE these medications   acetaminophen 325 MG tablet Commonly known as:  TYLENOL Take 2 tablets (650 mg total) by mouth every 8 (eight) hours.   ibuprofen 800 MG tablet Commonly known as:  ADVIL,MOTRIN Take 1 tablet (800 mg total) by mouth every 8 (eight) hours.   multivitamin with minerals Tabs tablet Take 1 tablet by mouth daily.   NU-IRON 150 MG capsule Generic drug:  iron polysaccharides Take 150 mg by  mouth daily.    polyethylene glycol packet Commonly known as:  MIRALAX / GLYCOLAX Take 17 g by mouth daily as needed for mild constipation.   traMADol 50 MG tablet Commonly known as:  ULTRAM Take 1 tablet (50 mg total) by mouth every 6 (six) hours as needed for moderate pain or severe pain.   Vitamin D-3 5000 units Tabs Take 5,000 Units by mouth daily.        Follow-up [facility], [place]. Call.   Specialty:  General Surgery Why:  as soon as possible to schedule an appointment for post-operative follow up in our general surgery clinic.  Contact information: [address] [place] [phone]          Signed: Obie Dredge, [facility] 10/21/2016, 11:36 AM Pager: [phone] Consults: [phone] Mon-Fri 7:00 am-4:30 pm Sat-Sun 7:00 am-11:30 am

## 2016-10-21 NOTE — Progress Notes (Signed)
Pt for discharge after lunch when ride gets here.  DC instructions reviewed and copy given.

## 2016-10-21 NOTE — Progress Notes (Signed)
Pt states she does not like how the ultram makes her feel, which is jittery and nervous, she states she just wants to use tylenol and advil at home.  PA paged.

## 2016-10-21 NOTE — Discharge Instructions (Signed)
PAIN: TYLENOL 800-1,000 mg every 8 hours as needed. Do not take more than 3,000mg  daily IBUPROFEN 800 mg every 8 hours as needed. Do not exceed 3,200 mg daily. ULTRAM (TRAMADOL) 50 MG q 6h as needed for pain. Do not drive if taking UTLRAM.  please arrive at least 30 min before your appointment to complete your check in paperwork.  If you are unable to arrive 30 min prior to your appointment time we may have to cancel or reschedule you.  LAPAROSCOPIC SURGERY: POST OP INSTRUCTIONS  1. DIET: Follow a light bland diet the first 24 hours after arrival home, such as soup, liquids, crackers, etc. Be sure to include lots of fluids daily. Avoid fast food or heavy meals as your are more likely to get nauseated. Eat a low fat the next few days after surgery.  2. Take your usually prescribed home medications unless otherwise directed. 3. PAIN CONTROL:  1. Pain is best controlled by a usual combination of three different methods TOGETHER:  1. Ice/Heat 2. Over the counter pain medication 3. Prescription pain medication 2. Most patients will experience some swelling and bruising around the incisions. Ice packs or heating pads (30-60 minutes up to 6 times a day) will help. Use ice for the first few days to help decrease swelling and bruising, then switch to heat to help relax tight/sore spots and speed recovery. Some people prefer to use ice alone, heat alone, alternating between ice & heat. Experiment to what works for you. Swelling and bruising can take several weeks to resolve.  3. It is helpful to take an over-the-counter pain medication regularly for the first few weeks. Choose one of the following that works best for you:  1. Naproxen (Aleve, etc) Two 220mg  tabs twice a day 2. Ibuprofen (Advil, etc) Three 200mg  tabs four times a day (every meal & bedtime) 3. Acetaminophen (Tylenol, etc) 500-650mg  four times a day (every meal & bedtime) 4. A prescription for pain medication (such as oxycodone, hydrocodone,  etc) should be given to you upon discharge. Take your pain medication as prescribed.  1. If you are having problems/concerns with the prescription medicine (does not control pain, nausea, vomiting, rash, itching, etc), please call us [phone] to see if we need to switch you to a different pain medicine that will work better for you and/or control your side effect better. 2. If you need a refill on your pain medication, please contact your pharmacy. They will contact our office to request authorization. Prescriptions will not be filled after 5 pm or on week-ends. 4. Avoid getting constipated. Between the surgery and the pain medications, it is common to experience some constipation. Increasing fluid intake and taking a fiber supplement (such as Metamucil, Citrucel, FiberCon, MiraLax, etc) 1-2 times a day regularly will usually help prevent this problem from occurring. A mild laxative (prune juice, Milk of Magnesia, MiraLax, etc) should be taken according to package directions if there are no bowel movements after 48 hours.  5. Watch out for diarrhea. If you have many loose bowel movements, simplify your diet to bland foods & liquids for a few days. Stop any stool softeners and decrease your fiber supplement. Switching to mild anti-diarrheal medications (Kayopectate, Pepto Bismol) can help. If this worsens or does not improve, please call us. 6. Wash / shower every day. You may shower over the dressings as they are waterproof. Continue to shower over incision(s) after the dressing is off. 7. Remove your waterproof bandages 5 days after  surgery. You may leave the incision open to air. You may replace a dressing/Band-Aid to cover the incision for comfort if you wish.  8. ACTIVITIES as tolerated:  1. You may resume regular (light) daily activities beginning the next day--such as daily self-care, walking, climbing stairs--gradually increasing activities as tolerated. If you can walk 30 minutes without  difficulty, it is safe to try more intense activity such as jogging, treadmill, bicycling, low-impact aerobics, swimming, etc. 2. Save the most intensive and strenuous activity for last such as sit-ups, heavy lifting, contact sports, etc Refrain from any heavy lifting or straining until you are off narcotics for pain control.  3. DO NOT PUSH THROUGH PAIN. Let pain be your guide: If it hurts to do something, don't do it. Pain is your body warning you to avoid that activity for another week until the pain goes down. 4. You may drive when you are no longer taking prescription pain medication, you can comfortably wear a seatbelt, and you can safely maneuver your car and apply brakes. 5. You may have sexual intercourse when it is comfortable.  9. FOLLOW UP in our office  1. Please call [facility] at [phone] to set up an appointment to see your surgeon in the office for a follow-up appointment approximately 2-3 weeks after your surgery. 2. Make sure that you call for this appointment the day you arrive home to insure a convenient appointment time.      10. IF YOU HAVE DISABILITY OR FAMILY LEAVE FORMS, BRING THEM TO THE               OFFICE FOR PROCESSING.   WHEN TO CALL us [phone]:  1. Poor pain control 2. Reactions / problems with new medications (rash/itching, nausea, etc)  3. Fever over 101.5 F (38.5 C) 4. Inability to urinate 5. Nausea and/or vomiting 6. Worsening swelling or bruising 7. Continued bleeding from incision. 8. Increased pain, redness, or drainage from the incision  The clinic staff is available to answer your questions during regular business hours (8:30am-5pm). Please dont hesitate to call and ask to speak to one of our nurses for clinical concerns.  If you have a medical emergency, go to the nearest emergency room or call 911.  A surgeon from [facility] is always on call at the [facility], [place], [place],  [place], [place] [address] ?  MAIN: [phone] ? TOLL FREE: [phone] ?  FAX [phone]  [email or web address]

## 2016-11-05 ENCOUNTER — Emergency Department (HOSPITAL_COMMUNITY): Payer: Managed Care, Other (non HMO)

## 2016-11-05 ENCOUNTER — Encounter (HOSPITAL_COMMUNITY): Payer: Self-pay | Admitting: *Deleted

## 2016-11-05 ENCOUNTER — Emergency Department (HOSPITAL_COMMUNITY)
Admission: EM | Admit: 2016-11-05 | Discharge: 2016-11-05 | Disposition: A | Discharge status: Home / Self Care | Payer: Managed Care, Other (non HMO) | Attending: Emergency Medicine | Admitting: Emergency Medicine

## 2016-11-05 DIAGNOSIS — K5909 Other constipation: Secondary | ICD-10-CM | POA: Diagnosis not present

## 2016-11-05 DIAGNOSIS — R112 Nausea with vomiting, unspecified: Secondary | ICD-10-CM | POA: Diagnosis not present

## 2016-11-05 DIAGNOSIS — D649 Anemia, unspecified: Secondary | ICD-10-CM | POA: Insufficient documentation

## 2016-11-05 DIAGNOSIS — K5900 Constipation, unspecified: Secondary | ICD-10-CM

## 2016-11-05 DIAGNOSIS — Z79899 Other long term (current) drug therapy: Secondary | ICD-10-CM | POA: Diagnosis not present

## 2016-11-05 DIAGNOSIS — R1084 Generalized abdominal pain: Secondary | ICD-10-CM | POA: Diagnosis present

## 2016-11-05 LAB — COMPREHENSIVE METABOLIC PANEL
ALT: 23 U/L (ref 14–54)
AST: 23 U/L (ref 15–41)
Albumin: 3.9 g/dL (ref 3.5–5.0)
Alkaline Phosphatase: 86 U/L (ref 38–126)
Anion gap: 7 (ref 5–15)
BUN: 9 mg/dL (ref 6–20)
CO2: 25 mmol/L (ref 22–32)
Calcium: 8.8 mg/dL — ABNORMAL LOW (ref 8.9–10.3)
Chloride: 105 mmol/L (ref 101–111)
Creatinine, Ser: 0.79 mg/dL (ref 0.44–1.00)
GFR calc Af Amer: >60 mL/min (ref >60)
GFR calc non Af Amer: >60 mL/min (ref >60)
Glucose, Bld: 86 mg/dL (ref 65–99)
Potassium: 4 mmol/L (ref 3.5–5.1)
Sodium: 137 mmol/L (ref 135–145)
Total Bilirubin: 0.3 mg/dL (ref 0.3–1.2)
Total Protein: 7.4 g/dL (ref 6.5–8.1)

## 2016-11-05 LAB — CBC
HCT: 34.7 % — ABNORMAL LOW (ref 36.0–46.0)
Hemoglobin: 10.9 g/dL — ABNORMAL LOW (ref 12.0–15.0)
MCH: 26.4 pg (ref 26.0–34.0)
MCHC: 31.4 g/dL (ref 30.0–36.0)
MCV: 84 fL (ref 78.0–100.0)
Platelets: 407 10*3/uL — ABNORMAL HIGH (ref 150–400)
RBC: 4.13 MIL/uL (ref 3.87–5.11)
RDW: 15.3 % (ref 11.5–15.5)
WBC: 5.5 10*3/uL (ref 4.0–10.5)

## 2016-11-05 LAB — I-STAT BETA HCG BLOOD, ED (MC, WL, AP ONLY): I-stat hCG, quantitative: <5 m[IU]/mL (ref <5)

## 2016-11-05 LAB — URINALYSIS, ROUTINE W REFLEX MICROSCOPIC
Bilirubin Urine: NEGATIVE
Glucose, UA: NEGATIVE mg/dL
Ketones, ur: NEGATIVE mg/dL
Leukocytes, UA: NEGATIVE
Nitrite: NEGATIVE
Protein, ur: NEGATIVE mg/dL
Specific Gravity, Urine: 1.025 (ref 1.005–1.030)
pH: 6 (ref 5.0–8.0)

## 2016-11-05 LAB — LIPASE, BLOOD: Lipase: 21 U/L (ref 11–51)

## 2016-11-05 LAB — I-STAT CG4 LACTIC ACID, ED: Lactic Acid, Venous: 0.71 mmol/L (ref 0.5–1.9)

## 2016-11-05 MED ORDER — DOCUSATE SODIUM 100 MG PO CAPS: 100.0000 mg | ORAL_CAPSULE | Freq: Two times a day (BID) | ORAL | 0 refills | Status: DC

## 2016-11-05 MED ORDER — IOPAMIDOL (ISOVUE-300) INJECTION 61%
INTRAVENOUS | Status: AC
  Filled 2016-11-05: qty 30

## 2016-11-05 MED ORDER — IOPAMIDOL (ISOVUE-300) INJECTION 61%
INTRAVENOUS | Status: AC
  Filled 2016-11-05: qty 100

## 2016-11-05 NOTE — ED Triage Notes (Signed)
Pt reports having appendix removed two weeks ago. Having generalized abd pain x 2 days, originated RUQ. Having nausea, denies vomiting or diarrhea. Denies fever.

## 2016-11-05 NOTE — ED Notes (Signed)
CT called to report pt c/o IV.  IV site flushes normally, but pt is not wanting them to use that IV.  CT was going to attempt IV start, but pt declined.  CT is bringing pt back to room.

## 2016-11-05 NOTE — ED Notes (Signed)
Per Dr. Eloisa Northern for pt to eat now.

## 2016-11-05 NOTE — ED Provider Notes (Signed)
[facility] DEPT Provider Note   CSN: [id number] Arrival date & time: 11/05/16  1405     History   Chief Complaint Chief Complaint  Patient presents with  . Abdominal Pain    HPI Caitlyn Roy is a 31 y.o. female.  31 year old female presents with two-day history of colicky superpubic abdominal pain. No associated fever or chills. Some nausea no vomiting. Denies any diarrhea. No vaginal bleeding or discharge. The patient had an appendectomy done 2 weeks ago. Symptoms wax and wane and nothing makes them better or worse. No treatment use prior to arrival. No prior history of same.      Past Medical History:  Diagnosis Date  . Anemia   . Anxiety   . 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 Tubal pregnancy   . Vaginal odor   . Vaginitis     Patient Active Problem List   Diagnosis Date Noted  . Acute appendicitis 10/20/2016  . Iron deficiency anemia 03/03/2016  . Palpitations 03/03/2016  . BV (bacterial vaginosis) 10/15/2014    Past Surgical History:  Procedure Laterality Date  . BIOPSY EYE MUSCLE    . CESAREAN SECTION  2010  . LAPAROSCOPIC APPENDECTOMY N/A 10/20/2016   Procedure: APPENDECTOMY LAPAROSCOPIC;  Surgeon: Judeth Horn, MD;  Location: [place];  Service: General;  Laterality: N/A;  . laporoscopy      OB History    Gravida Para Term Preterm AB Living   7 1 1   6 1    SAB TAB Ectopic Multiple Live Births   5   1   1        Home Medications    Prior to Admission medications   Medication Sig Start Date End Date Taking? Authorizing Provider  acetaminophen (TYLENOL) 325 MG tablet Take 2 tablets (650 mg total) by mouth every 8 (eight) hours. 10/21/16   Jill Alexanders, PA-C  Cholecalciferol (VITAMIN D-3) 5000 units TABS Take 5,000 Units by mouth daily.    [provider]  ibuprofen (ADVIL,MOTRIN) 800 MG tablet Take 1 tablet (800 mg total) by mouth every 8 (eight) hours. 10/21/16    Jill Alexanders, PA-C  iron polysaccharides (NU-IRON) 150 MG capsule Take 150 mg by mouth daily.    [provider]  Multiple Vitamin (MULTIVITAMIN WITH MINERALS) TABS tablet Take 1 tablet by mouth daily.    [provider]  polyethylene glycol (MIRALAX / GLYCOLAX) packet Take 17 g by mouth daily as needed for mild constipation. 10/21/16   Jill Alexanders, PA-C  traMADol (ULTRAM) 50 MG tablet Take 1 tablet (50 mg total) by mouth every 6 (six) hours as needed for moderate pain or severe pain. 10/21/16   Jill Alexanders, PA-C    Family History Family History  Problem Relation Age of Onset  . Hypertension Mother   . Breast cancer Mother   . Heart attack Mother   . Alcohol abuse Father     Social History Social History  Substance Use Topics  . Smoking status: Never Smoker  . Smokeless tobacco: Never Used  . Alcohol use 1.2 oz/week    2 Glasses of wine per week     Comment: occ     Allergies   Oxycodone   Review of Systems Review of Systems  All other systems reviewed and are negative.    Physical Exam Updated Vital Signs BP 120/77 (BP Location: Right Arm)   Pulse 86   Temp  98.7 F (37.1 C) (Oral)   Resp 16   Ht 1.575 m (5\' 2" )   Wt 72.6 kg (160 lb)   LMP 10/30/2016   SpO2 100%   BMI 29.26 kg/m   Physical Exam  Constitutional: She is oriented to person, place, and time. She appears well-developed and well-nourished.  Non-toxic appearance. No distress.  HENT:  Head: Normocephalic and atraumatic.  Eyes: Conjunctivae, EOM and lids are normal. Pupils are equal, round, and reactive to light.  Neck: Normal range of motion. Neck supple. No tracheal deviation present. No thyroid mass present.  Cardiovascular: Normal rate, regular rhythm and normal heart sounds.  Exam reveals no gallop.   No murmur heard. Pulmonary/Chest: Effort normal and breath sounds normal. No stridor. No respiratory distress. She has no decreased breath sounds. She has no  wheezes. She has no rhonchi. She has no rales.  Abdominal: Soft. Normal appearance and bowel sounds are normal. She exhibits no distension. There is tenderness in the suprapubic area. There is no rigidity, no rebound, no guarding and no CVA tenderness.    Musculoskeletal: Normal range of motion. She exhibits no edema or tenderness.  Neurological: She is alert and oriented to person, place, and time. She has normal strength. No cranial nerve deficit or sensory deficit. GCS eye subscore is 4. GCS verbal subscore is 5. GCS motor subscore is 6.  Skin: Skin is warm and dry. No abrasion and no rash noted.  Psychiatric: She has a normal mood and affect. Her speech is normal and behavior is normal.  Nursing note and vitals reviewed.    ED Treatments / Results  Labs (all labs ordered are listed, but only abnormal results are displayed) Labs Reviewed  COMPREHENSIVE METABOLIC PANEL - Abnormal; Notable for the following:       Result Value   Calcium 8.8 (*)    All other components within normal limits  CBC - Abnormal; Notable for the following:    Hemoglobin 10.9 (*)    HCT 34.7 (*)    Platelets 407 (*)    All other components within normal limits  LIPASE, BLOOD  URINALYSIS, ROUTINE W REFLEX MICROSCOPIC  I-STAT BETA HCG BLOOD, ED ([facility], [facility], [facility] ONLY)  I-STAT CG4 LACTIC ACID, ED    EKG  EKG Interpretation None       Radiology No results found.  Procedures Procedures (including critical care time)  Medications Ordered in ED Medications - No data to display   Initial Impression / Assessment and Plan / ED Course  I have reviewed the triage vital signs and the nursing notes.  Pertinent labs & imaging results that were available during my care of the patient were reviewed by me and considered in my medical decision making (see chart for details).     Patient with evidence of constipation on her abdominal CT. No evidence of gallbladder pathology on that CT. Urinalysis negative for  infection. Will prescribe stool softeners  Final Clinical Impressions(s) / ED Diagnoses   Final diagnoses:  None    New Prescriptions New Prescriptions   No medications on file     Lacretia Leigh, MD 11/05/16 2220

## 2016-11-24 ENCOUNTER — Other Ambulatory Visit (HOSPITAL_COMMUNITY): Payer: Self-pay | Admitting: General Surgery

## 2016-11-24 DIAGNOSIS — K8020 Calculus of gallbladder without cholecystitis without obstruction: Secondary | ICD-10-CM

## 2016-11-27 ENCOUNTER — Emergency Department (HOSPITAL_COMMUNITY)
Admission: EM | Admit: 2016-11-27 | Discharge: 2016-11-28 | Disposition: A | Discharge status: Home / Self Care | Payer: 59 | Attending: Emergency Medicine | Admitting: Emergency Medicine

## 2016-11-27 ENCOUNTER — Emergency Department (HOSPITAL_COMMUNITY): Payer: 59

## 2016-11-27 ENCOUNTER — Encounter (HOSPITAL_COMMUNITY): Payer: Self-pay | Admitting: Emergency Medicine

## 2016-11-27 DIAGNOSIS — D649 Anemia, unspecified: Secondary | ICD-10-CM | POA: Insufficient documentation

## 2016-11-27 DIAGNOSIS — R002 Palpitations: Secondary | ICD-10-CM | POA: Diagnosis not present

## 2016-11-27 DIAGNOSIS — Z791 Long term (current) use of non-steroidal anti-inflammatories (NSAID): Secondary | ICD-10-CM | POA: Insufficient documentation

## 2016-11-27 DIAGNOSIS — Z79899 Other long term (current) drug therapy: Secondary | ICD-10-CM | POA: Insufficient documentation

## 2016-11-27 DIAGNOSIS — R5383 Other fatigue: Secondary | ICD-10-CM | POA: Diagnosis present

## 2016-11-27 LAB — CBC
HCT: 34.9 % — ABNORMAL LOW (ref 36.0–46.0)
Hemoglobin: 11.2 g/dL — ABNORMAL LOW (ref 12.0–15.0)
MCH: 26.5 pg (ref 26.0–34.0)
MCHC: 32.1 g/dL (ref 30.0–36.0)
MCV: 82.7 fL (ref 78.0–100.0)
Platelets: 407 10*3/uL — ABNORMAL HIGH (ref 150–400)
RBC: 4.22 MIL/uL (ref 3.87–5.11)
RDW: 14.5 % (ref 11.5–15.5)
WBC: 6.2 10*3/uL (ref 4.0–10.5)

## 2016-11-27 LAB — URINALYSIS, ROUTINE W REFLEX MICROSCOPIC
Bacteria, UA: NONE SEEN
Bilirubin Urine: NEGATIVE
Glucose, UA: NEGATIVE mg/dL
Ketones, ur: NEGATIVE mg/dL
Leukocytes, UA: NEGATIVE
Nitrite: NEGATIVE
Protein, ur: NEGATIVE mg/dL
Specific Gravity, Urine: 1.013 (ref 1.005–1.030)
pH: 6 (ref 5.0–8.0)

## 2016-11-27 LAB — BASIC METABOLIC PANEL
Anion gap: 6 (ref 5–15)
BUN: 6 mg/dL (ref 6–20)
CO2: 27 mmol/L (ref 22–32)
Calcium: 9.2 mg/dL (ref 8.9–10.3)
Chloride: 102 mmol/L (ref 101–111)
Creatinine, Ser: 0.85 mg/dL (ref 0.44–1.00)
GFR calc Af Amer: >60 mL/min (ref >60)
GFR calc non Af Amer: >60 mL/min (ref >60)
Glucose, Bld: 91 mg/dL (ref 65–99)
Potassium: 3.8 mmol/L (ref 3.5–5.1)
Sodium: 135 mmol/L (ref 135–145)

## 2016-11-27 LAB — I-STAT TROPONIN, ED: Troponin i, poc: 0 ng/mL (ref 0.00–0.08)

## 2016-11-27 NOTE — ED Triage Notes (Signed)
Pt presents to ED for assessment of intermittent palpitations, intermittent shortness of breath.  Pt currently taking iron for anemia.  Pt also states she has had symptoms like this in the past with low potassium.  Pt also c/o lower back pain, severe in nature, lasting twenty mintues after urinating the other day.  Denies any other urinary symptoms.  Pt denies dizzines, blurred vision, headache.

## 2016-11-28 NOTE — ED Provider Notes (Signed)
[facility] DEPT Provider Note   CSN: [id number] Arrival date & time: 11/27/16  2045     History   Chief Complaint Chief Complaint  Patient presents with  . Fatigue  . Palpitations    HPI Caitlyn Roy is a 31 y.o. female.  Patient presents to the emergency department with chief complaint of heart palpitations. She states that she is experiences symptoms before. States that the symptoms recurred today. She reports also feeling fatigued. She denies any chest pain or shortness breath. Denies any syncope. Denies any any abdominal pain, nausea, vomiting, or diarrhea. She denies any stimulant use. She states that she was seen by cardiology once before for this, and had a normal Holter test. She questions whether her symptoms are anxiety. She denies any other associated symptoms.   The history is provided by the patient. No language interpreter was used.    Past Medical History:  Diagnosis Date  . Anemia   . Anxiety   . 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 Tubal pregnancy   . Vaginal odor   . Vaginitis     Patient Active Problem List   Diagnosis Date Noted  . Acute appendicitis 10/20/2016  . Iron deficiency anemia 03/03/2016  . Palpitations 03/03/2016  . BV (bacterial vaginosis) 10/15/2014    Past Surgical History:  Procedure Laterality Date  . BIOPSY EYE MUSCLE    . CESAREAN SECTION  2010  . LAPAROSCOPIC APPENDECTOMY N/A 10/20/2016   Procedure: APPENDECTOMY LAPAROSCOPIC;  Surgeon: Judeth Horn, MD;  Location: [facility];  Service: General;  Laterality: N/A;  . laporoscopy      OB History    Gravida Para Term Preterm AB Living   7 1 1   6 1    SAB TAB Ectopic Multiple Live Births   5   1   1        Home Medications    Prior to Admission medications   Medication Sig Start Date End Date Taking? Authorizing Provider  acetaminophen (TYLENOL) 325 MG tablet Take 2 tablets (650 mg total) by mouth  every 8 (eight) hours. Patient not taking: Reported on 11/05/2016 10/21/16   Jill Alexanders, PA-C  Cholecalciferol (VITAMIN D-3) 5000 units TABS Take 5,000 Units by mouth daily.    [provider]  docusate sodium (COLACE) 100 MG capsule Take 1 capsule (100 mg total) by mouth every 12 (twelve) hours. 11/05/16   Lacretia Leigh, MD  ibuprofen (ADVIL,MOTRIN) 800 MG tablet Take 1 tablet (800 mg total) by mouth every 8 (eight) hours. Patient not taking: Reported on 11/05/2016 10/21/16   Jill Alexanders, PA-C  iron polysaccharides (NU-IRON) 150 MG capsule Take 150 mg by mouth daily.    [provider]  polyethylene glycol (MIRALAX / GLYCOLAX) packet Take 17 g by mouth daily as needed for mild constipation. Patient not taking: Reported on 11/05/2016 10/21/16   Jill Alexanders, PA-C  traMADol (ULTRAM) 50 MG tablet Take 1 tablet (50 mg total) by mouth every 6 (six) hours as needed for moderate pain or severe pain. Patient not taking: Reported on 11/05/2016 10/21/16   Jill Alexanders, PA-C    Family History Family History  Problem Relation Age of Onset  . Hypertension Mother   . Breast cancer Mother   . Heart attack Mother   . Alcohol abuse Father     Social History Social History  Substance Use Topics  . Smoking status: Never Smoker  .  Smokeless tobacco: Never Used  . Alcohol use 1.2 oz/week    2 Glasses of wine per week     Comment: occ     Allergies   Oxycodone   Review of Systems Review of Systems  All other systems reviewed and are negative.    Physical Exam Updated Vital Signs BP 122/74   Pulse 94   Temp 98.4 F (36.9 C) (Oral)   Resp 16   Ht 5\' 2"  (1.575 m)   Wt 74.8 kg (165 lb)   LMP 10/30/2016   SpO2 100%   BMI 30.18 kg/m   Physical Exam  Constitutional: She is oriented to person, place, and time. She appears well-developed and well-nourished.  HENT:  Head: Normocephalic and atraumatic.  Eyes: Pupils are equal, round, and reactive to  light. Conjunctivae and EOM are normal.  Neck: Normal range of motion. Neck supple.  Cardiovascular: Normal rate and regular rhythm.  Exam reveals no gallop and no friction rub.   No murmur heard. Pulmonary/Chest: Effort normal and breath sounds normal. No respiratory distress. She has no wheezes. She has no rales. She exhibits no tenderness.  Abdominal: Soft. Bowel sounds are normal. She exhibits no distension and no mass. There is no tenderness. There is no rebound and no guarding.  Musculoskeletal: Normal range of motion. She exhibits no edema or tenderness.  Neurological: She is alert and oriented to person, place, and time.  Skin: Skin is warm and dry.  Psychiatric: She has a normal mood and affect. Her behavior is normal. Judgment and thought content normal.  Nursing note and vitals reviewed.    ED Treatments / Results  Labs (all labs ordered are listed, but only abnormal results are displayed) Labs Reviewed  CBC - Abnormal; Notable for the following:       Result Value   Hemoglobin 11.2 (*)    HCT 34.9 (*)    Platelets 407 (*)    All other components within normal limits  URINALYSIS, ROUTINE W REFLEX MICROSCOPIC - Abnormal; Notable for the following:    APPearance HAZY (*)    Hgb urine dipstick MODERATE (*)    Squamous Epithelial / LPF 0-5 (*)    All other components within normal limits  BASIC METABOLIC PANEL  I-STAT TROPONIN, ED    EKG  EKG Interpretation None       Radiology Dg Chest 2 View  Result Date: 11/27/2016 CLINICAL DATA:  Palpitations and shortness of Breath EXAM: CHEST  2 VIEW COMPARISON:  08/18/2016 FINDINGS: The heart size and mediastinal contours are within normal limits. Both lungs are clear. The visualized skeletal structures are unremarkable. IMPRESSION: No active cardiopulmonary disease. Electronically Signed   By: Inez Catalina M.D.   On: 11/27/2016 21:29    Procedures Procedures (including critical care time)  Medications Ordered in  ED Medications - No data to display   Initial Impression / Assessment and Plan / ED Course  I have reviewed the triage vital signs and the nursing notes.  Pertinent labs & imaging results that were available during my care of the patient were reviewed by me and considered in my medical decision making (see chart for details).     Patient with heart palpitations. EKG is unremarkable for any concerning arrhythmia. Laboratory workup is reassuring. Vital signs are stable. Recommend outpatient follow-up.  Final Clinical Impressions(s) / ED Diagnoses   Final diagnoses:  Palpitations    New Prescriptions Discharge Medication List as of 11/28/2016  1:17 AM  Montine Circle, PA-C 41/42/39 5320    Delora Fuel, MD 23/34/35 [phone]

## 2016-12-04 ENCOUNTER — Encounter (HOSPITAL_COMMUNITY)
Admission: RE | Admit: 2016-12-04 | Discharge: 2016-12-04 | Disposition: A | Discharge status: Home / Self Care | Payer: 59 | Source: Ambulatory Visit | Attending: General Surgery | Admitting: General Surgery

## 2016-12-04 DIAGNOSIS — K8020 Calculus of gallbladder without cholecystitis without obstruction: Secondary | ICD-10-CM | POA: Insufficient documentation

## 2016-12-04 MED ORDER — TECHNETIUM TC 99M MEBROFENIN IV KIT
5.0000 | PACK | Freq: Once | INTRAVENOUS | Status: AC | PRN
  Administered 2016-12-04: 5 via INTRAVENOUS

## 2016-12-26 ENCOUNTER — Emergency Department (HOSPITAL_COMMUNITY)
Admission: EM | Admit: 2016-12-26 | Discharge: 2016-12-26 | Disposition: A | Discharge status: Home / Self Care | Payer: 59 | Attending: Emergency Medicine | Admitting: Emergency Medicine

## 2016-12-26 ENCOUNTER — Encounter (HOSPITAL_COMMUNITY): Payer: Self-pay | Admitting: Emergency Medicine

## 2016-12-26 ENCOUNTER — Emergency Department (HOSPITAL_COMMUNITY): Payer: 59

## 2016-12-26 DIAGNOSIS — Z79899 Other long term (current) drug therapy: Secondary | ICD-10-CM | POA: Insufficient documentation

## 2016-12-26 DIAGNOSIS — D649 Anemia, unspecified: Secondary | ICD-10-CM | POA: Insufficient documentation

## 2016-12-26 DIAGNOSIS — R519 Headache, unspecified: Secondary | ICD-10-CM

## 2016-12-26 DIAGNOSIS — R51 Headache: Secondary | ICD-10-CM | POA: Diagnosis not present

## 2016-12-26 MED ORDER — IBUPROFEN 200 MG PO TABS: 600.0000 mg | ORAL_TABLET | Freq: Once | ORAL | Status: DC

## 2016-12-26 NOTE — ED Notes (Signed)
Declined W/C at D/C and was escorted to lobby by RN. 

## 2016-12-26 NOTE — ED Triage Notes (Signed)
Pt here for left sided head pain at temple area x 2 weeks

## 2016-12-26 NOTE — Discharge Instructions (Signed)
Please read attached information. If you experience any new or worsening signs or symptoms please return to the emergency room for evaluation. Please follow-up with your primary care provider or specialist as discussed.  °

## 2016-12-26 NOTE — ED Provider Notes (Signed)
[facility] DEPT Provider Note   CSN: [id number] Arrival date & time: 12/26/16  1251     History   Chief Complaint Chief Complaint  Patient presents with  . Headache    HPI Caitlyn Roy is a 31 y.o. female.  HPI   31 year old female presents today with complaints of headache. Patient has bilateral throbbing headache for the last 2 weeks. She notes symptoms have worsened on the left side. She reports pain with pressing on her left temple. She denies any infectious etiology, trauma, neurological deficits, or history of the same. Patient reports the pain is not severe enough to take any medication and has not taken any for this. Patient notes a history of orbital pseudotumor, she reports this feels different from previous presentations.   Past Medical History:  Diagnosis Date  . Anemia   . Anxiety   . 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 Tubal pregnancy   . Vaginal odor   . Vaginitis     Patient Active Problem List   Diagnosis Date Noted  . Acute appendicitis 10/20/2016  . Iron deficiency anemia 03/03/2016  . Palpitations 03/03/2016  . BV (bacterial vaginosis) 10/15/2014    Past Surgical History:  Procedure Laterality Date  . BIOPSY EYE MUSCLE    . CESAREAN SECTION  2010  . LAPAROSCOPIC APPENDECTOMY N/A 10/20/2016   Procedure: APPENDECTOMY LAPAROSCOPIC;  Surgeon: Judeth Horn, MD;  Location: [facility];  Service: General;  Laterality: N/A;  . laporoscopy      OB History    Gravida Para Term Preterm AB Living   7 1 1   6 1    SAB TAB Ectopic Multiple Live Births   5   1   1        Home Medications    Prior to Admission medications   Medication Sig Start Date End Date Taking? Authorizing Provider  acetaminophen (TYLENOL) 325 MG tablet Take 2 tablets (650 mg total) by mouth every 8 (eight) hours. Patient not taking: Reported on 11/05/2016 10/21/16   Jill Alexanders, PA-C  Cholecalciferol  (VITAMIN D-3) 5000 units TABS Take 5,000 Units by mouth daily.    [provider]  docusate sodium (COLACE) 100 MG capsule Take 1 capsule (100 mg total) by mouth every 12 (twelve) hours. 11/05/16   Lacretia Leigh, MD  ibuprofen (ADVIL,MOTRIN) 800 MG tablet Take 1 tablet (800 mg total) by mouth every 8 (eight) hours. Patient not taking: Reported on 11/05/2016 10/21/16   Jill Alexanders, PA-C  iron polysaccharides (NU-IRON) 150 MG capsule Take 150 mg by mouth daily.    [provider]  polyethylene glycol (MIRALAX / GLYCOLAX) packet Take 17 g by mouth daily as needed for mild constipation. Patient not taking: Reported on 11/05/2016 10/21/16   Jill Alexanders, PA-C  traMADol (ULTRAM) 50 MG tablet Take 1 tablet (50 mg total) by mouth every 6 (six) hours as needed for moderate pain or severe pain. Patient not taking: Reported on 11/05/2016 10/21/16   Jill Alexanders, PA-C    Family History Family History  Problem Relation Age of Onset  . Hypertension Mother   . Breast cancer Mother   . Heart attack Mother   . Alcohol abuse Father     Social History Social History  Substance Use Topics  . Smoking status: Never Smoker  . Smokeless tobacco: Never Used  . Alcohol use 1.2 oz/week    2 Glasses of wine per  week     Comment: occ     Allergies   Oxycodone   Review of Systems Review of Systems  All other systems reviewed and are negative.    Physical Exam Updated Vital Signs BP 121/69 (BP Location: Right Arm)   Pulse 90   Temp 98.4 F (36.9 C) (Oral)   Resp 18   LMP 11/27/2016 (Approximate)   SpO2 100%   Physical Exam  Constitutional: She is oriented to person, place, and time. She appears well-developed and well-nourished. No distress.  HENT:  Head: Normocephalic and atraumatic.  Tenderness to palpation of left temple, no swelling- full active range of motion of the jaw nontender to palpation of the TMJ  Eyes: Pupils are equal, round, and reactive to  light. Conjunctivae and EOM are normal. Right eye exhibits no discharge. Left eye exhibits no discharge. No scleral icterus.  Neck: Normal range of motion. Neck supple. No JVD present. No tracheal deviation present.  Pulmonary/Chest: Effort normal. No stridor.  Musculoskeletal: Normal range of motion. She exhibits no edema or tenderness.  Lymphadenopathy:    She has no cervical adenopathy.  Neurological: She is alert and oriented to person, place, and time. She has normal strength. She displays no atrophy and no tremor. No cranial nerve deficit or sensory deficit. She exhibits normal muscle tone. She displays a negative Romberg sign. She displays no seizure activity. Coordination and gait normal. GCS eye subscore is 4. GCS verbal subscore is 5. GCS motor subscore is 6.  Reflex Scores:      Patellar reflexes are 2+ on the right side and 2+ on the left side. Skin: She is not diaphoretic.  Psychiatric: She has a normal mood and affect. Her behavior is normal. Judgment and thought content normal.  Nursing note and vitals reviewed.    ED Treatments / Results  Labs (all labs ordered are listed, but only abnormal results are displayed) Labs Reviewed - No data to display  EKG  EKG Interpretation None       Radiology Ct Head Wo Contrast  Result Date: 12/26/2016 CLINICAL DATA:  Left-sided headache for 2 weeks.  Blurred vision. EXAM: CT HEAD WITHOUT CONTRAST TECHNIQUE: Contiguous axial images were obtained from the base of the skull through the vertex without intravenous contrast. COMPARISON:  September 01, 2014 FINDINGS: Brain: No evidence of acute infarction, hemorrhage, hydrocephalus, extra-axial collection or mass lesion/mass effect. Vascular: No hyperdense vessel or unexpected calcification. Skull: Normal. Negative for fracture or focal lesion. Sinuses/Orbits: There is scattered mucosal thickening in the ethmoid sinuses. Paranasal sinuses, mastoid air cells, and middle ears are otherwise  unremarkable. Other: None. IMPRESSION: 1. No acute intracranial abnormality. 2. Scattered mucosal thickening in the ethmoid sinuses. Electronically Signed   By: Dorise Bullion III M.D   On: 12/26/2016 15:56    Procedures Procedures (including critical care time)  Medications Ordered in ED Medications  ibuprofen (ADVIL,MOTRIN) tablet 600 mg (not administered)     Initial Impression / Assessment and Plan / ED Course  I have reviewed the triage vital signs and the nursing notes.  Pertinent labs & imaging results that were available during my care of the patient were reviewed by me and considered in my medical decision making (see chart for details).      Final Clinical Impressions(s) / ED Diagnoses   Final diagnoses:  Acute nonintractable headache, unspecified headache type    31 year old female presents to with headache. She has reassuring CT, the suspicion for significant intracranial abnormality, low suspicion  for temporal arteritis. Patient will be discharged with outpatient follow-up and strict return precautions and she verbalized understanding and agreement to today's plan had no further questions or concerns at time of discharge  New Prescriptions New Prescriptions   No medications on file     Okey Regal, Hershal Coria 12/26/16 1617    Veryl Speak, MD 12/26/16 [phone]

## 2016-12-29 ENCOUNTER — Telehealth: Payer: Self-pay | Admitting: Internal Medicine

## 2016-12-29 DIAGNOSIS — F419 Anxiety disorder, unspecified: Secondary | ICD-10-CM | POA: Diagnosis not present

## 2016-12-29 DIAGNOSIS — R002 Palpitations: Secondary | ICD-10-CM | POA: Insufficient documentation

## 2016-12-29 NOTE — Telephone Encounter (Signed)
Pt states she continues to have palpitations, was checked at [facility] today, would like to go ahead and get echocardiogram. Pt advised I will forward to [facility] to contact pt to schedule.

## 2016-12-29 NOTE — Telephone Encounter (Signed)
New message   Pt states she would like to take Dr. Saunders Revel up on the offer for Echo.

## 2016-12-30 ENCOUNTER — Other Ambulatory Visit: Payer: Self-pay

## 2016-12-30 ENCOUNTER — Emergency Department (HOSPITAL_COMMUNITY): Payer: 59

## 2016-12-30 ENCOUNTER — Encounter (HOSPITAL_COMMUNITY): Payer: Self-pay | Admitting: Emergency Medicine

## 2016-12-30 ENCOUNTER — Emergency Department (HOSPITAL_COMMUNITY)
Admission: EM | Admit: 2016-12-30 | Discharge: 2016-12-30 | Disposition: A | Discharge status: Home / Self Care | Payer: 59 | Attending: Emergency Medicine | Admitting: Emergency Medicine

## 2016-12-30 DIAGNOSIS — R002 Palpitations: Secondary | ICD-10-CM

## 2016-12-30 LAB — BASIC METABOLIC PANEL
Anion gap: 7 (ref 5–15)
BUN: 7 mg/dL (ref 6–20)
CO2: 27 mmol/L (ref 22–32)
Calcium: 8.8 mg/dL — ABNORMAL LOW (ref 8.9–10.3)
Chloride: 105 mmol/L (ref 101–111)
Creatinine, Ser: 0.84 mg/dL (ref 0.44–1.00)
GFR calc Af Amer: >60 mL/min (ref >60)
GFR calc non Af Amer: >60 mL/min (ref >60)
Glucose, Bld: 114 mg/dL — ABNORMAL HIGH (ref 65–99)
Potassium: 3.6 mmol/L (ref 3.5–5.1)
Sodium: 139 mmol/L (ref 135–145)

## 2016-12-30 LAB — CBC
HCT: 32.5 % — ABNORMAL LOW (ref 36.0–46.0)
Hemoglobin: 10.3 g/dL — ABNORMAL LOW (ref 12.0–15.0)
MCH: 26.3 pg (ref 26.0–34.0)
MCHC: 31.7 g/dL (ref 30.0–36.0)
MCV: 83.1 fL (ref 78.0–100.0)
Platelets: 406 10*3/uL — ABNORMAL HIGH (ref 150–400)
RBC: 3.91 MIL/uL (ref 3.87–5.11)
RDW: 14.5 % (ref 11.5–15.5)
WBC: 6.9 10*3/uL (ref 4.0–10.5)

## 2016-12-30 LAB — I-STAT TROPONIN, ED: Troponin i, poc: 0 ng/mL (ref 0.00–0.08)

## 2016-12-30 LAB — D-DIMER, QUANTITATIVE: D-Dimer, Quant: <0.27 ug/mL-FEU (ref 0.00–0.50)

## 2016-12-30 LAB — TSH: TSH: 2.819 u[IU]/mL (ref 0.350–4.500)

## 2016-12-30 MED ORDER — HYDROXYZINE HCL 25 MG PO TABS: 25.0000 mg | ORAL_TABLET | Freq: Four times a day (QID) | ORAL | 0 refills | Status: DC

## 2016-12-30 MED ORDER — LORAZEPAM 2 MG/ML IJ SOLN
1.0000 mg | Freq: Once | INTRAMUSCULAR | Status: AC
  Administered 2016-12-30: 1 mg via INTRAVENOUS
  Filled 2016-12-30: qty 1

## 2016-12-30 MED ORDER — ALPRAZOLAM 0.5 MG PO TABS: 0.5000 mg | ORAL_TABLET | Freq: Three times a day (TID) | ORAL | 0 refills | Status: DC | PRN

## 2016-12-30 MED ORDER — SODIUM CHLORIDE 0.9 % IV BOLUS (SEPSIS)
1000.0000 mL | Freq: Once | INTRAVENOUS | Status: AC
  Administered 2016-12-30: 1000 mL via INTRAVENOUS

## 2016-12-30 NOTE — ED Provider Notes (Signed)
[facility] DEPT Provider Note   CSN: [id number] Arrival date & time: 12/29/16  2358     History   Chief Complaint Chief Complaint  Patient presents with  . Chest Pain  . Palpitations     HPI  Blood pressure 107/68, pulse 95, temperature 98.5 F (36.9 C), temperature source Oral, resp. rate (!) 26, height 5\' 2"  (1.575 m), weight 77.1 kg (170 lb), last menstrual period 12/17/2016, SpO2 100 %.  Caitlyn Roy is a 31 y.o. female complaining of acute onset of heart palpitations 2 days ago directly after she switched from [place] to Claritin. She stopped taking the Claritin after the first dose. She feels that her heart is beating fast and irregular. She has associated retrosternal chest pain that is exacerbated by palpation but not pleuritic, exertional. She's had similar episodes in the past and was seen by Dr. Saunders Revel cardiology with a Holter monitor that was negative. She is scheduled for echo. She does not drink any caffeinated beverages, she's not started taking any supplements. She's been eating and drinking normally. She denies syncope, lightheadedness but she does state that she had an acute onset of severe fatigue last night which has passed.she rates her chest pain at 6 out of 10 and declines any medications. She denies any family history of early cardiac death, tobacco or drug use, diabetes, hypertension, hyperlipidemia. She also denies any hormonal birth control, recent immobilizations, history of DVT/PE, calf pain, leg swelling.    Past Medical History:  Diagnosis Date  . Anemia   . Anxiety   . 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 Tubal pregnancy   . Vaginal odor   . Vaginitis     Patient Active Problem List   Diagnosis Date Noted  . Acute appendicitis 10/20/2016  . Iron deficiency anemia 03/03/2016  . Palpitations 03/03/2016  . BV (bacterial vaginosis) 10/15/2014    Past Surgical History:    Procedure Laterality Date  . BIOPSY EYE MUSCLE    . CESAREAN SECTION  2010  . LAPAROSCOPIC APPENDECTOMY N/A 10/20/2016   Procedure: APPENDECTOMY LAPAROSCOPIC;  Surgeon: Judeth Horn, MD;  Location: [facility];  Service: General;  Laterality: N/A;  . laporoscopy      OB History    Gravida Para Term Preterm AB Living   7 1 1   6 1    SAB TAB Ectopic Multiple Live Births   5   1   1        Home Medications    Prior to Admission medications   Medication Sig Start Date End Date Taking? Authorizing Provider  acetaminophen (TYLENOL) 325 MG tablet Take 2 tablets (650 mg total) by mouth every 8 (eight) hours. Patient not taking: Reported on 11/05/2016 10/21/16   Jill Alexanders, PA-C  ALPRAZolam Duanne Moron) 0.5 MG tablet Take 1 tablet (0.5 mg total) by mouth 3 (three) times daily as needed for anxiety. 12/30/16   Carma Dwiggins, Elmyra Ricks, PA-C  docusate sodium (COLACE) 100 MG capsule Take 1 capsule (100 mg total) by mouth every 12 (twelve) hours. Patient not taking: Reported on 12/30/2016 11/05/16   Lacretia Leigh, MD  hydrOXYzine (ATARAX/VISTARIL) 25 MG tablet Take 1 tablet (25 mg total) by mouth every 6 (six) hours. 12/30/16   Yonah Tangeman, Elmyra Ricks, PA-C  ibuprofen (ADVIL,MOTRIN) 800 MG tablet Take 1 tablet (800 mg total) by mouth every 8 (eight) hours. Patient not taking: Reported on 11/05/2016 10/21/16   Jill Alexanders, PA-C  polyethylene glycol (  MIRALAX / GLYCOLAX) packet Take 17 g by mouth daily as needed for mild constipation. Patient not taking: Reported on 11/05/2016 10/21/16   Jill Alexanders, PA-C  traMADol (ULTRAM) 50 MG tablet Take 1 tablet (50 mg total) by mouth every 6 (six) hours as needed for moderate pain or severe pain. Patient not taking: Reported on 11/05/2016 10/21/16   Jill Alexanders, PA-C    Family History Family History  Problem Relation Age of Onset  . Hypertension Mother   . Breast cancer Mother   . Heart attack Mother   . Alcohol abuse Father     Social History Social  History  Substance Use Topics  . Smoking status: Never Smoker  . Smokeless tobacco: Never Used  . Alcohol use 1.2 oz/week    2 Glasses of wine per week     Comment: occ     Allergies   Oxycodone   Review of Systems Review of Systems  A complete review of systems was obtained and all systems are negative except as noted in the HPI and PMH.    Physical Exam Updated Vital Signs BP 119/77   Pulse 87   Temp 98.5 F (36.9 C) (Oral)   Resp (!) 22   Ht 5\' 2"  (1.575 m)   Wt 77.1 kg (170 lb)   LMP 12/17/2016   SpO2 100%   BMI 31.09 kg/m   Physical Exam  Constitutional: She appears well-developed and well-nourished.  HENT:  Head: Normocephalic.  Right Ear: External ear normal.  Left Ear: External ear normal.  Mouth/Throat: Oropharynx is clear and moist. No oropharyngeal exudate.  No drooling or stridor. Posterior pharynx mildly erythematous no significant tonsillar hypertrophy. No exudate. Soft palate rises symmetrically. No TTP or induration under tongue.   No tenderness to palpation of frontal or bilateral maxillary sinuses.  Mild mucosal edema in the nares with scant rhinorrhea.  Bilateral tympanic membranes with normal architecture and good light reflex.    Eyes: Pupils are equal, round, and reactive to light. Conjunctivae and EOM are normal.  Neck: Normal range of motion. Neck supple.  Cardiovascular: Normal rate and regular rhythm.   Pulmonary/Chest: Effort normal and breath sounds normal. No stridor. No respiratory distress. She has no wheezes. She has no rales. She exhibits no tenderness.  Abdominal: Soft. There is no tenderness. There is no rebound and no guarding.  Nursing note and vitals reviewed.    ED Treatments / Results  Labs (all labs ordered are listed, but only abnormal results are displayed) Labs Reviewed  BASIC METABOLIC PANEL - Abnormal; Notable for the following:       Result Value   Glucose, Bld 114 (*)    Calcium 8.8 (*)    All other  components within normal limits  CBC - Abnormal; Notable for the following:    Hemoglobin 10.3 (*)    HCT 32.5 (*)    Platelets 406 (*)    All other components within normal limits  D-DIMER, QUANTITATIVE (NOT AT [facility])  TSH  I-STAT TROPONIN, ED    EKG  EKG Interpretation None       Radiology Dg Chest 2 View  Result Date: 12/30/2016 CLINICAL DATA:  31 year old female with central chest pain and palpitations. Intermittent headache. EXAM: CHEST  2 VIEW COMPARISON:  11/27/2016 and earlier. FINDINGS: Lung volumes are stable and within normal limits. Normal cardiac size and mediastinal contours. Visualized tracheal air column is within normal limits. Lung parenchyma is stable and clear. Negative visualized osseous  structures aside from mild spinal curvature. Negative visible bowel gas pattern. IMPRESSION: Stable and negative.  No acute cardiopulmonary abnormality. Electronically Signed   By: Genevie Ann M.D.   On: 12/30/2016 01:14    Procedures Procedures (including critical care time)  Medications Ordered in ED Medications  sodium chloride 0.9 % bolus 1,000 mL (0 mLs Intravenous Stopped 12/30/16 0857)  LORazepam (ATIVAN) injection 1 mg (1 mg Intravenous Given 12/30/16 0811)     Initial Impression / Assessment and Plan / ED Course  I have reviewed the triage vital signs and the nursing notes.  Pertinent labs & imaging results that were available during my care of the patient were reviewed by me and considered in my medical decision making (see chart for details).     Vitals:   12/30/16 0700 12/30/16 0748 12/30/16 0815 12/30/16 0830  BP: 107/68 120/69 118/69 119/77  Pulse: 95 86 87   Resp: (!) 26 17 20  (!) 22  Temp:      TempSrc:      SpO2: 100% 100% 100%   Weight:      Height:        Medications  sodium chloride 0.9 % bolus 1,000 mL (0 mLs Intravenous Stopped 12/30/16 0857)  LORazepam (ATIVAN) injection 1 mg (1 mg Intravenous Given 12/30/16 0811)    Caitlyn Roy is 31  y.o. female presenting with palpitations and chest pain onset 2 days ago. She's had similar exacerbations in the past. She had Holter monitoring which did not show any abnormalities. Patient is refusing pain medication in the ED. EKG sinus tachycardia, on monitor she has a sinusarrhythmia. Patient is low risk by heart score.Low risk by Wells. Dimer negative.TSH normal.   Patient has good outpatient care with both primary care and cardiology. She has improved symptoms with Ativan. I will write her a prescription for Vistaril and Xanax. I've advised her to follow closely with primary care and cardiology. She verbalized understanding.  Evaluation does not show pathology that would require ongoing emergent intervention or inpatient treatment. Pt is hemodynamically stable and mentating appropriately. Discussed findings and plan with patient/guardian, who agrees with care plan. All questions answered. Return precautions discussed and outpatient follow up given.    Final Clinical Impressions(s) / ED Diagnoses   Final diagnoses:  Palpitations    New Prescriptions Discharge Medication List as of 12/30/2016  9:34 AM    START taking these medications   Details  ALPRAZolam (XANAX) 0.5 MG tablet Take 1 tablet (0.5 mg total) by mouth 3 (three) times daily as needed for anxiety., Starting Sat 12/30/2016, Print    hydrOXYzine (ATARAX/VISTARIL) 25 MG tablet Take 1 tablet (25 mg total) by mouth every 6 (six) hours., Starting Sat 12/30/2016, Print         Sierra Bissonette, Charna Elizabeth 12/30/16 1001    Lacretia Leigh, MD 12/30/16 1316

## 2016-12-30 NOTE — Discharge Instructions (Signed)
Your thyroid testing today was normal. Follow with cardiology for echo.   please take Vistaril scheduled every 6 hours while awake for the next 2 days. After that take it as needed, if you do not feel that this helps you try the Xanax, don't hesitate to return to the emergency department for any new, worsening or concerning symptoms.  Please follow with your primary care doctor in the next 2 days for a check-up. They must obtain records for further management.   Do not hesitate to return to the Emergency Department for any new, worsening or concerning symptoms.

## 2016-12-30 NOTE — ED Triage Notes (Signed)
Reports having central chest pain and palpitations that started last night.  Also reporting headaches that come and go.

## 2017-01-01 ENCOUNTER — Other Ambulatory Visit: Payer: Self-pay | Admitting: *Deleted

## 2017-01-01 ENCOUNTER — Telehealth: Payer: Self-pay | Admitting: *Deleted

## 2017-01-01 DIAGNOSIS — R002 Palpitations: Secondary | ICD-10-CM

## 2017-01-01 NOTE — Telephone Encounter (Signed)
S/w pt stated was calling to schedule echo due to last time office s/w pt it was cancelled.  Pt started getting irate and stated " well that was months ago this echo was cancelled and like I have been having symptoms since than I want it scheduled," stated I called to schedule this and pt stated would like to talk with someone else." Stated in class right now and would like a call back at 11:00 from a manager. Will send to [facility] to [facility] and f/u.

## 2017-01-02 NOTE — Progress Notes (Signed)
01/02/2017 14:15 (late entry 01/01/2017 11:15)  Returned call to patient as requested by CMA Juanda Crumble.  Patient she someone already called with an appt on Friday and she had no further questions.  Georgana Curio MHA RN CCM

## 2017-01-12 ENCOUNTER — Ambulatory Visit (HOSPITAL_COMMUNITY): Payer: 59 | Attending: Cardiology

## 2017-01-12 ENCOUNTER — Other Ambulatory Visit: Payer: Self-pay

## 2017-01-12 ENCOUNTER — Ambulatory Visit (HOSPITAL_COMMUNITY): Payer: 59

## 2017-01-12 DIAGNOSIS — F419 Anxiety disorder, unspecified: Secondary | ICD-10-CM | POA: Diagnosis not present

## 2017-01-12 DIAGNOSIS — R002 Palpitations: Secondary | ICD-10-CM

## 2017-01-12 DIAGNOSIS — D649 Anemia, unspecified: Secondary | ICD-10-CM | POA: Insufficient documentation

## 2017-01-12 NOTE — Telephone Encounter (Signed)
Noted  

## 2017-01-16 ENCOUNTER — Ambulatory Visit: Payer: Self-pay | Admitting: General Surgery

## 2017-01-26 ENCOUNTER — Ambulatory Visit: Payer: Managed Care, Other (non HMO) | Admitting: Podiatry

## 2017-04-07 ENCOUNTER — Inpatient Hospital Stay (HOSPITAL_COMMUNITY)
Admission: AD | Admit: 2017-04-07 | Discharge: 2017-04-07 | Disposition: A | Discharge status: Home / Self Care | Payer: 59 | Source: Ambulatory Visit | Attending: Obstetrics & Gynecology | Admitting: Obstetrics & Gynecology

## 2017-04-07 ENCOUNTER — Other Ambulatory Visit: Payer: Self-pay

## 2017-04-07 ENCOUNTER — Encounter (HOSPITAL_COMMUNITY): Payer: Self-pay

## 2017-04-07 DIAGNOSIS — R109 Unspecified abdominal pain: Secondary | ICD-10-CM | POA: Diagnosis present

## 2017-04-07 DIAGNOSIS — N938 Other specified abnormal uterine and vaginal bleeding: Secondary | ICD-10-CM | POA: Insufficient documentation

## 2017-04-07 DIAGNOSIS — F419 Anxiety disorder, unspecified: Secondary | ICD-10-CM | POA: Insufficient documentation

## 2017-04-07 DIAGNOSIS — N939 Abnormal uterine and vaginal bleeding, unspecified: Secondary | ICD-10-CM | POA: Diagnosis present

## 2017-04-07 LAB — URINALYSIS, ROUTINE W REFLEX MICROSCOPIC
Bacteria, UA: NONE SEEN
Bilirubin Urine: NEGATIVE
Glucose, UA: NEGATIVE mg/dL
Ketones, ur: 5 mg/dL — AB
Leukocytes, UA: NEGATIVE
Nitrite: NEGATIVE
Protein, ur: NEGATIVE mg/dL
Specific Gravity, Urine: 1.023 (ref 1.005–1.030)
pH: 5 (ref 5.0–8.0)

## 2017-04-07 LAB — WET PREP, GENITAL
Clue Cells Wet Prep HPF POC: NONE SEEN
Sperm: NONE SEEN
Trich, Wet Prep: NONE SEEN
Yeast Wet Prep HPF POC: NONE SEEN

## 2017-04-07 LAB — POCT PREGNANCY, URINE: Preg Test, Ur: NEGATIVE

## 2017-04-07 MED ORDER — IBUPROFEN 800 MG PO TABS
800.0000 mg | ORAL_TABLET | Freq: Once | ORAL | Status: AC
  Administered 2017-04-07: 800 mg via ORAL
  Filled 2017-04-07: qty 1

## 2017-04-07 MED ORDER — IBUPROFEN 800 MG PO TABS: 800.0000 mg | ORAL_TABLET | Freq: Three times a day (TID) | ORAL | 1 refills | Status: DC | PRN

## 2017-04-07 NOTE — MAU Provider Note (Signed)
History   [id number] LMP 02/13/17 in with bleeding and cramping since last night. States is scheduled for IUD in January. States has not taken any for cramping.  CSN: [id number]  Arrival date & time 04/07/17  2059   None     Chief Complaint  Patient presents with  . Abdominal Pain  . Back Pain  . Vaginal Bleeding    HPI  Past Medical History:  Diagnosis Date  . Anemia   . Anxiety   . 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 Tubal pregnancy   . Vaginal odor   . Vaginitis     Past Surgical History:  Procedure Laterality Date  . BIOPSY EYE MUSCLE    . CESAREAN SECTION  2010  . LAPAROSCOPIC APPENDECTOMY N/A 10/20/2016   Procedure: APPENDECTOMY LAPAROSCOPIC;  Surgeon: Judeth Horn, MD;  Location: [facility];  Service: General;  Laterality: N/A;  . laporoscopy      Family History  Problem Relation Age of Onset  . Hypertension Mother   . Breast cancer Mother   . Heart attack Mother   . Alcohol abuse Father     Social History   Tobacco Use  . Smoking status: Never Smoker  . Smokeless tobacco: Never Used  Substance Use Topics  . Alcohol use: Yes    Alcohol/week: 1.2 oz    Types: 2 Glasses of wine per week    Comment: occ  . Drug use: No    OB History    Gravida Para Term Preterm AB Living   7 1 1   6 1    SAB TAB Ectopic Multiple Live Births   5   1   1       Review of Systems  Constitutional: Negative.   HENT: Negative.   Eyes: Negative.   Respiratory: Negative.   Cardiovascular: Negative.   Gastrointestinal: Positive for abdominal pain.  Endocrine: Negative.   Genitourinary: Negative.   Musculoskeletal: Negative.   Skin: Negative.   Allergic/Immunologic: Negative.   Neurological: Negative.   Hematological: Negative.   Psychiatric/Behavioral: Negative.     Allergies  Oxycodone  Home Medications    BP 125/72 (BP Location: Left Arm)   Pulse 86   Temp 98.9 F (37.2 C) (Oral)   Resp 16    LMP 03/16/2017 (Exact Date)   SpO2 100%   Physical Exam  Constitutional: She is oriented to person, place, and time. She appears well-developed and well-nourished.  HENT:  Head: Normocephalic.  Eyes: Pupils are equal, round, and reactive to light.  Neck: Normal range of motion.  Cardiovascular: Normal rate, regular rhythm, normal heart sounds and intact distal pulses.  Pulmonary/Chest: Effort normal and breath sounds normal.  Abdominal: Soft. Bowel sounds are normal.  Genitourinary: Vagina normal and uterus normal.  Musculoskeletal: Normal range of motion.  Neurological: She is alert and oriented to person, place, and time. She has normal reflexes.  Skin: Skin is warm and dry.  Psychiatric: She has a normal mood and affect. Her behavior is normal. Judgment and thought content normal.    MAU Course  Procedures (including critical care time)  Labs Reviewed  WET PREP, GENITAL  URINALYSIS, ROUTINE W REFLEX MICROSCOPIC  POCT PREGNANCY, URINE  GC/CHLAMYDIA PROBE AMP () NOT AT [facility]   No results found.   1. DUB (dysfunctional uterine bleeding)       MDM  VSS, Sterile spec exam done small amt of dark bleeding with thumb  nail sized clots. Wet prep and cultures done. Report to Carmelia Roller CNM  Dr Nelda Marseille notfied UPT negative Pat requests blood test but does not want to wait Wants Rx for ibuprofen Has appt 04/12/17 in office and again in January Discharge home Rx ibuprofen for cramps Bleeding precautions (only light bleeding now)  Seabron Spates, CNM

## 2017-04-07 NOTE — Discharge Instructions (Signed)
Dysfunctional Uterine Bleeding °Dysfunctional uterine bleeding is abnormal bleeding from the uterus. Dysfunctional uterine bleeding includes: °· A period that comes earlier or later than usual. °· A period that is lighter, heavier, or has blood clots. °· Bleeding between periods. °· Skipping one or more periods. °· Bleeding after sexual intercourse. °· Bleeding after menopause. ° °Follow these instructions at home: °Pay attention to any changes in your symptoms. Follow these instructions to help with your condition: °Eating and drinking °· Eat well-balanced meals. Include foods that are high in iron, such as liver, meat, shellfish, green leafy vegetables, and eggs. °· If you become constipated: °? Drink plenty of water. °? Eat fruits and vegetables that are high in water and fiber, such as spinach, carrots, raspberries, apples, and mango. °Medicines °· Take over-the-counter and prescription medicines only as told by your health care provider. °· Do not change medicines without talking with your health care provider. °· Aspirin or medicines that contain aspirin may make the bleeding worse. Do not take those medicines: °? During the week before your period. °? During your period. °· If you were prescribed iron pills, take them as told by your health care provider. Iron pills help to replace iron that your body loses because of this condition. °Activity °· If you need to change your sanitary pad or tampon more than one time every 2 hours: °? Lie in bed with your feet raised (elevated). °? Place a cold pack on your lower abdomen. °? Rest as much as possible until the bleeding stops or slows down. °· Do not try to lose weight until the bleeding has stopped and your blood iron level is back to normal. °Other Instructions °· For two months, write down: °? When your period starts. °? When your period ends. °? When any abnormal bleeding occurs. °? What problems you notice. °· Keep all follow up visits as told by your health  care provider. This is important. °Contact a health care provider if: °· You get light-headed or weak. °· You have nausea and vomiting. °· You cannot eat or drink without vomiting. °· You feel dizzy or have diarrhea while you are taking medicines. °· You are taking birth control pills or hormones, and you want to change them or stop taking them. °Get help right away if: °· You develop a fever or chills. °· You need to change your sanitary pad or tampon more than one time per hour. °· Your bleeding becomes heavier, or your flow contains clots more often. °· You develop pain in your abdomen. °· You lose consciousness. °· You develop a rash. °This information is not intended to replace advice given to you by your health care provider. Make sure you discuss any questions you have with your health care provider. °Document Released: 04/21/2000 Document Revised: 09/30/2015 Document Reviewed: 07/20/2014 °Elsevier Interactive Patient Education © 2018 [employer] Inc[employer] ° °

## 2017-04-07 NOTE — MAU Note (Signed)
Pt c/o heavy bleeding and clots since last night.  Her LMP was 03/16/17 and last 4 days.  She says they normally last 6 days. Plans to have [place] placed in January.  Also, c/o abdominal cramping that radiates to back that she rates a 7 out of 10.

## 2017-04-10 LAB — GC/CHLAMYDIA PROBE AMP (~~LOC~~) NOT AT ARMC
Chlamydia: NEGATIVE
Neisseria Gonorrhea: NEGATIVE

## 2017-04-23 ENCOUNTER — Telehealth: Payer: Self-pay | Admitting: Diagnostic Neuroimaging

## 2017-04-23 DIAGNOSIS — G8929 Other chronic pain: Secondary | ICD-10-CM

## 2017-04-23 DIAGNOSIS — R200 Anesthesia of skin: Secondary | ICD-10-CM

## 2017-04-23 DIAGNOSIS — R51 Headache: Secondary | ICD-10-CM

## 2017-04-23 DIAGNOSIS — R519 Headache, unspecified: Secondary | ICD-10-CM

## 2017-04-23 DIAGNOSIS — H05113 Granuloma of bilateral orbits: Secondary | ICD-10-CM

## 2017-04-23 NOTE — Telephone Encounter (Signed)
Ok. Will refer to [facility] for blurred vision, and history of orbital pseudotumor. Also will refer to [facility] for HA and numbness eval.  Orders Placed This Encounter  Procedures  . Ambulatory referral to Ophthalmology    Referral Priority:   Routine    Referral Type:   Consultation    Referral Reason:   Specialty Services Required    Requested Specialty:   Ophthalmology    Number of Visits Requested:   1  . Ambulatory referral to Neurology    Referral Priority:   Routine    Referral Type:   Consultation    Referral Reason:   Second Opinion    Requested Specialty:   Neurology    Number of Visits Requested:   Harlan, MD 37/34/2876, 8:11 PM Certified in Neurology, Neurophysiology and Neuroimaging  [facility] [address], [place], [place] [address] [phone]

## 2017-04-23 NOTE — Telephone Encounter (Signed)
Pt calling she is experiencing swollen itchy eyes, numbness in the head, HA's blurred vision and would like a referral to [facility]. Pt said this was discussed last time she spoke with Dr Mamie Nick. Please call to advise.

## 2017-04-24 NOTE — Telephone Encounter (Signed)
Spoke with patient and informed her of two referrals placed. Advised she will get a call; and if she doesn't within a week, she should call back and check on referral status. Patient verbalized understanding, appreciation.

## 2017-04-25 NOTE — Telephone Encounter (Addendum)
Dr. Leta Baptist   I was trying to Coordinate her Ophthalmology apt and her Neurology apt. Together . Neurology called me with a CX for 04/26/2017 at 10:00 am . Patient is fine to go to Neurology apt and make two trips.  Still waiting on a call back from [facility] . [facility] is stating patient may have to see Neuro optomologist . Waiting on a return telephone call.

## 2017-04-26 ENCOUNTER — Telehealth: Payer: Self-pay | Admitting: Diagnostic Neuroimaging

## 2017-04-26 NOTE — Telephone Encounter (Signed)
Waiting on a call back [employer] . Telephone [phone] - fax [phone]

## 2017-05-10 NOTE — Telephone Encounter (Signed)
Dr. Leta Baptist , I have called x3 and again today . Dr. Reviewing her record for approval of the apt. They want give me an apt date at this time yet.

## 2017-05-10 NOTE — Telephone Encounter (Signed)
Hinton Dyer, please call again and see if [facility] can see patient sooner. -VRP

## 2017-05-10 NOTE — Telephone Encounter (Signed)
Noted Caitlyn Roy  °

## 2017-05-10 NOTE — Telephone Encounter (Signed)
Called patient to check on her referral to [facility]. She stated she has made numerous calls; the last call was this morning. She stated Caryl Pina in the office tells her that she cannot be scheduled because the referral is still being reviewed by the doctor. Caryl Pina has advised her to go to the ED if necessary. Patient reports that she has swelling of both eyes which goes down throughout the day. She has blurred vision at night, and pain. She stated she does not want to go to the ED, and she is tolerating it until she can be seen. This RN advised will let Rance Muir, referral coordinator know. Informed her that Dr Leta Baptist is out of the office until next Monday. Patient verbalized understanding, appreciation.

## 2017-05-15 NOTE — Telephone Encounter (Signed)
Caryl Pina called me back and relayed to me that she will call me back 05/16/2017 with a apt for patient. Caryl Pina relayed patient will be seen sometime next week  . I relayed to Caryl Pina to that I waiting on her call.

## 2017-05-15 NOTE — Telephone Encounter (Signed)
Called [facility] again and left Caryl Pina a message to see if status was still in Dr. Dorothy Spark asked Caryl Pina to please call me back [phone].

## 2017-05-16 NOTE — Telephone Encounter (Signed)
Noted  

## 2017-05-17 NOTE — Telephone Encounter (Signed)
Patient is scheduled with Neuro ophthalmology at [facility] 06/04/2017 arrive at 1:00 pm . Patient is aware.

## 2017-06-09 ENCOUNTER — Emergency Department (HOSPITAL_COMMUNITY): Payer: 59

## 2017-06-09 ENCOUNTER — Other Ambulatory Visit: Payer: Self-pay

## 2017-06-09 ENCOUNTER — Emergency Department (HOSPITAL_COMMUNITY)
Admission: EM | Admit: 2017-06-09 | Discharge: 2017-06-09 | Disposition: A | Discharge status: Home / Self Care | Payer: 59 | Attending: Emergency Medicine | Admitting: Emergency Medicine

## 2017-06-09 ENCOUNTER — Encounter (HOSPITAL_COMMUNITY): Payer: Self-pay | Admitting: *Deleted

## 2017-06-09 DIAGNOSIS — R05 Cough: Secondary | ICD-10-CM | POA: Insufficient documentation

## 2017-06-09 DIAGNOSIS — Z79899 Other long term (current) drug therapy: Secondary | ICD-10-CM | POA: Insufficient documentation

## 2017-06-09 DIAGNOSIS — R079 Chest pain, unspecified: Secondary | ICD-10-CM | POA: Insufficient documentation

## 2017-06-09 DIAGNOSIS — R0602 Shortness of breath: Secondary | ICD-10-CM | POA: Diagnosis present

## 2017-06-09 LAB — CBC
HCT: 35.6 % — ABNORMAL LOW (ref 36.0–46.0)
Hemoglobin: 11.3 g/dL — ABNORMAL LOW (ref 12.0–15.0)
MCH: 27 pg (ref 26.0–34.0)
MCHC: 31.7 g/dL (ref 30.0–36.0)
MCV: 85.2 fL (ref 78.0–100.0)
Platelets: 410 10*3/uL — ABNORMAL HIGH (ref 150–400)
RBC: 4.18 MIL/uL (ref 3.87–5.11)
RDW: 15.1 % (ref 11.5–15.5)
WBC: 6 10*3/uL (ref 4.0–10.5)

## 2017-06-09 LAB — BASIC METABOLIC PANEL WITH GFR
Anion gap: 11 (ref 5–15)
BUN: 8 mg/dL (ref 6–20)
CO2: 23 mmol/L (ref 22–32)
Calcium: 9 mg/dL (ref 8.9–10.3)
Chloride: 102 mmol/L (ref 101–111)
Creatinine, Ser: 0.87 mg/dL (ref 0.44–1.00)
GFR calc Af Amer: >60 mL/min
GFR calc non Af Amer: >60 mL/min
Glucose, Bld: 90 mg/dL (ref 65–99)
Potassium: 3.9 mmol/L (ref 3.5–5.1)
Sodium: 136 mmol/L (ref 135–145)

## 2017-06-09 LAB — I-STAT BETA HCG BLOOD, ED (MC, WL, AP ONLY): I-stat hCG, quantitative: <5 m[IU]/mL

## 2017-06-09 LAB — I-STAT TROPONIN, ED: Troponin i, poc: 0 ng/mL (ref 0.00–0.08)

## 2017-06-09 LAB — D-DIMER, QUANTITATIVE: D-Dimer, Quant: 0.42 ug{FEU}/mL (ref 0.00–0.50)

## 2017-06-09 MED ORDER — KETOROLAC TROMETHAMINE 30 MG/ML IJ SOLN
30.0000 mg | Freq: Once | INTRAMUSCULAR | Status: AC
  Administered 2017-06-09: 30 mg via INTRAMUSCULAR
  Filled 2017-06-09: qty 1

## 2017-06-09 NOTE — ED Triage Notes (Signed)
Pt c/o phlegm in throat and productive cough for one month, pt c/o bil upper chest with deep inspiration and coughing, denies cardiac history, denies n/v/d, c/o SOB, A&O x4, speaks in complete sentences

## 2017-06-09 NOTE — ED Notes (Signed)
Pt stated she did not want to wait for her D/C paperwork and would like to just leave. Pt ambulated to front lobby with steady gait and in no apparent distress.

## 2017-06-09 NOTE — Discharge Instructions (Signed)
Please follow-up with your cardiologist, Dr. Amalia Hailey, and your primary care provider.  Your testing today is reassuring and shows no emergent cardiopulmonary condition.  You may have had bronchitis over the last couple weeks, so I recommend taking Mucinex to help loosen up the secretions in your lungs.  Please return the emergency department for any acutely worsening chest pain, shortness of breath, dizziness or lightheadedness refill you are going to pass out, nausea, vomiting, or any other emergent concerns.

## 2017-06-09 NOTE — ED Provider Notes (Signed)
[facility] EMERGENCY DEPARTMENT Provider Note   CSN: [id number] Arrival date & time: 06/09/17  [place]     History   Chief Complaint Chief Complaint  Patient presents with  . Shortness of Breath    HPI Caitlyn Roy is a 32 y.o. female.  HPI   Patient is a 32 year old female with a history of anemia, anxiety, palpitations, and orbital pseudotumor presenting for supraclavicular chest pain, shortness of breath, and cough productive of sputum for 1 month.  Patient reports that she is experienced this supraclavicular pain on deep breathing for 2 days now.  Patient reports that she did forcefully cause herself to vomit when she felt nauseous 3 days ago, however the pain did not start immediately after this.  Patient reports that upon deep breathing she feels this sensation, and causes her to be short of breath in her throat.  Patient reports that over the past month, she has had an intermittent episodes of feeling that she has a tightness in her chest, however these episodes resolve spontaneously.  Patient denies fevers, chills, hemoptysis, lightheadedness, syncope or presyncope.  Patient reports that she has coughed persistently for 1 month and produce green sputum.  Patient denies taking any medications to improve this.  Patient denies history of asthma, but reports she has taken inhalers in the past respiratory infections.  Patient denies estrogen use, recent immobilization, recent hospitalization, recent surgery, unilateral leg swelling, history of cancer, or history of DVT/PE.  Past Medical History:  Diagnosis Date  . Anemia   . Anxiety   . 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 Tubal pregnancy   . Vaginal odor   . Vaginitis     Patient Active Problem List   Diagnosis Date Noted  . Acute appendicitis 10/20/2016  . Iron deficiency anemia 03/03/2016  . Palpitations 03/03/2016  . BV (bacterial  vaginosis) 10/15/2014    Past Surgical History:  Procedure Laterality Date  . BIOPSY EYE MUSCLE    . CESAREAN SECTION  2010  . LAPAROSCOPIC APPENDECTOMY N/A 10/20/2016   Procedure: APPENDECTOMY LAPAROSCOPIC;  Surgeon: Judeth Horn, MD;  Location: [facility];  Service: General;  Laterality: N/A;  . laporoscopy      OB History    Gravida Para Term Preterm AB Living   7 1 1   6 1    SAB TAB Ectopic Multiple Live Births   5   1   1        Home Medications    Prior to Admission medications   Medication Sig Start Date End Date Taking? Authorizing Provider  acetaminophen (TYLENOL) 325 MG tablet Take 2 tablets (650 mg total) by mouth every 8 (eight) hours. Patient not taking: Reported on 11/05/2016 10/21/16   Jill Alexanders, PA-C  ALPRAZolam Duanne Moron) 0.5 MG tablet Take 1 tablet (0.5 mg total) by mouth 3 (three) times daily as needed for anxiety. 12/30/16   Pisciotta, Elmyra Ricks, PA-C  docusate sodium (COLACE) 100 MG capsule Take 1 capsule (100 mg total) by mouth every 12 (twelve) hours. Patient not taking: Reported on 12/30/2016 11/05/16   Lacretia Leigh, MD  escitalopram (LEXAPRO) 10 MG tablet Take 10 mg by mouth daily.    [provider]  hydrOXYzine (ATARAX/VISTARIL) 25 MG tablet Take 1 tablet (25 mg total) by mouth every 6 (six) hours. 12/30/16   Pisciotta, Elmyra Ricks, PA-C  ibuprofen (ADVIL,MOTRIN) 800 MG tablet Take 1 tablet (800 mg total) by mouth every 8 (  eight) hours. Patient not taking: Reported on 11/05/2016 10/21/16   Jill Alexanders, PA-C  ibuprofen (ADVIL,MOTRIN) 800 MG tablet Take 1 tablet (800 mg total) by mouth every 8 (eight) hours as needed for moderate pain. 04/07/17   Seabron Spates, CNM  polyethylene glycol (MIRALAX / GLYCOLAX) packet Take 17 g by mouth daily as needed for mild constipation. Patient not taking: Reported on 11/05/2016 10/21/16   Jill Alexanders, PA-C    Family History Family History  Problem Relation Age of Onset  . Hypertension Mother   . Breast  cancer Mother   . Heart attack Mother   . Alcohol abuse Father     Social History Social History   Tobacco Use  . Smoking status: Never Smoker  . Smokeless tobacco: Never Used  Substance Use Topics  . Alcohol use: No    Frequency: Never  . Drug use: No     Allergies   Oxycodone   Review of Systems Review of Systems  Constitutional: Negative for chills and fever.  HENT: Positive for congestion. Negative for sore throat.   Eyes: Negative for visual disturbance.  Respiratory: Positive for cough, chest tightness and shortness of breath.   Cardiovascular: Positive for chest pain. Negative for leg swelling.  Gastrointestinal: Negative for nausea and vomiting.  Genitourinary: Negative for dysuria.  Musculoskeletal: Negative for back pain and myalgias.  Skin: Negative for rash.  Neurological: Negative for dizziness, syncope, light-headedness and headaches.     Physical Exam Updated Vital Signs BP 117/70 (BP Location: Right Arm)   Pulse (!) 107   Temp 98.8 F (37.1 C) (Oral)   Resp 16   Ht 5\' 2"  (3.151 m)   Wt 72.6 kg (160 lb)   LMP 05/26/2017   SpO2 100%   BMI 29.26 kg/m   Physical Exam  Constitutional: She appears well-developed and well-nourished. No distress.  HENT:  Head: Normocephalic and atraumatic.  Mouth/Throat: Oropharynx is clear and moist.  Eyes: Conjunctivae and EOM are normal. Pupils are equal, round, and reactive to light.  Neck: Normal range of motion. Neck supple.  Cardiovascular: Normal rate, regular rhythm, S1 normal, S2 normal and intact distal pulses.  No murmur heard. Radial pulses 2+ and equal bilaterally.  Patient non-tachycardic on my examination with pulse of 84.  Pulmonary/Chest: Effort normal and breath sounds normal. She has no wheezes. She has no rales.  Abdominal: Soft. She exhibits no distension. There is no tenderness. There is no guarding.  Musculoskeletal: Normal range of motion. She exhibits no edema or deformity.  No calf  tenderness.  Compartments of lower extremity soft.  Lymphadenopathy:    She has no cervical adenopathy.  Neurological: She is alert.  Cranial nerves grossly intact. Patient moves extremities symmetrically and with good coordination.  Skin: Skin is warm and dry. No rash noted. No erythema.  Psychiatric: She has a normal mood and affect. Her behavior is normal. Judgment and thought content normal.  Nursing note and vitals reviewed.    ED Treatments / Results  Labs (all labs ordered are listed, but only abnormal results are displayed) Labs Reviewed  CBC - Abnormal; Notable for the following components:      Result Value   Hemoglobin 11.3 (*)    HCT 35.6 (*)    Platelets 410 (*)    All other components within normal limits  BASIC METABOLIC PANEL  D-DIMER, QUANTITATIVE (NOT AT [facility])  I-STAT TROPONIN, ED  I-STAT BETA HCG BLOOD, ED ([facility], [facility], [facility] ONLY)  EKG  EKG Interpretation None       Radiology Dg Chest 2 View  Result Date: 06/09/2017 CLINICAL DATA:  Productive cough. EXAM: CHEST  2 VIEW COMPARISON:  12/30/2016 FINDINGS: Cardiomediastinal silhouette is normal. Mediastinal contours appear intact. There is no evidence of focal airspace consolidation, pleural effusion or pneumothorax. Osseous structures are without acute abnormality. Soft tissues are grossly normal. IMPRESSION: No active cardiopulmonary disease. Electronically Signed   By: Fidela Salisbury M.D.   On: 06/09/2017 17:42    Procedures Procedures (including critical care time)  Medications Ordered in ED Medications  ketorolac (TORADOL) 30 MG/ML injection 30 mg (not administered)     Initial Impression / Assessment and Plan / ED Course  I have reviewed the triage vital signs and the nursing notes.  Pertinent labs & imaging results that were available during my care of the patient were reviewed by me and considered in my medical decision making (see chart for details).      Final Clinical Impressions(s) /  ED Diagnoses   Final diagnoses:  Central chest pain   Patient is nontoxic-appearing, non-tachycardic, and in no acute distress.  Patient is 100% oxygen on room air.  Patient with 2 days of supraclavicular chest pain that is primarily in the central upper neck region.  Suspect muscular skeletal cause.  Patient shows sinus tachycardia without ischemia, infection, or arrhythmia on EKG.  Tachycardia resolved on my examination.  Doubt pulmonary embolism, as patient is PERC and Wells negative.  Doubt thoracic aortic dissection, as patient's pain is nonradiating, and pulses are equal bilaterally.  Pain is not consistent with pericarditis, and there are no EKG changes to suggest as such.  Had risk benefit discussion with the patient regarding d-dimer.  Discussed pretest probability.  Patient is requesting d-dimer, as she has had this before, and she reported that she knows of this test.  I discussed the testing algorithm with the patient, and proceeded with testing per patient shared decision making.  Troponin negative.  D-dimer negative.  Hemoglobin 11.3, but a full point higher than 6 months ago.  Chest x-ray without cardiopulmonary abnormality.  Discussed bronchitis is possible precipitant for patient's pain, as well as muscular skeletal strain from vomiting.  Return precautions were discussed with the patient including worsening chest pain, shortness breath, dizziness, light headedness, or presyncope.  Encouraged Mucinex.  Patient left without receiving discharge paperwork, but a full verbal discharge was performed with the patient as well as instructions to follow-up with her PCP and cardiologist,   Who was evaluated her for palpitations.  Patient is in understanding and agrees with the plan of care.  ED Discharge Orders    None       Tamala Julian 06/09/17 2022    Valarie Merino, MD 06/10/17 [phone]

## 2017-06-27 ENCOUNTER — Inpatient Hospital Stay (HOSPITAL_COMMUNITY)
Admission: AD | Admit: 2017-06-27 | Discharge: 2017-06-27 | Disposition: A | Discharge status: Home / Self Care | Payer: 59 | Source: Ambulatory Visit | Attending: Obstetrics & Gynecology | Admitting: Obstetrics & Gynecology

## 2017-06-27 ENCOUNTER — Encounter (HOSPITAL_COMMUNITY): Payer: Self-pay | Admitting: *Deleted

## 2017-06-27 ENCOUNTER — Other Ambulatory Visit: Payer: Self-pay

## 2017-06-27 DIAGNOSIS — R1030 Lower abdominal pain, unspecified: Secondary | ICD-10-CM | POA: Diagnosis not present

## 2017-06-27 DIAGNOSIS — Z885 Allergy status to narcotic agent status: Secondary | ICD-10-CM | POA: Insufficient documentation

## 2017-06-27 DIAGNOSIS — D649 Anemia, unspecified: Secondary | ICD-10-CM | POA: Insufficient documentation

## 2017-06-27 DIAGNOSIS — R1032 Left lower quadrant pain: Secondary | ICD-10-CM

## 2017-06-27 DIAGNOSIS — F419 Anxiety disorder, unspecified: Secondary | ICD-10-CM | POA: Diagnosis not present

## 2017-06-27 DIAGNOSIS — N760 Acute vaginitis: Secondary | ICD-10-CM | POA: Insufficient documentation

## 2017-06-27 DIAGNOSIS — Z79899 Other long term (current) drug therapy: Secondary | ICD-10-CM | POA: Insufficient documentation

## 2017-06-27 DIAGNOSIS — N898 Other specified noninflammatory disorders of vagina: Secondary | ICD-10-CM | POA: Diagnosis present

## 2017-06-27 DIAGNOSIS — R110 Nausea: Secondary | ICD-10-CM | POA: Diagnosis present

## 2017-06-27 DIAGNOSIS — O00109 Unspecified tubal pregnancy without intrauterine pregnancy: Secondary | ICD-10-CM | POA: Insufficient documentation

## 2017-06-27 LAB — URINALYSIS, ROUTINE W REFLEX MICROSCOPIC
Bacteria, UA: NONE SEEN
Bilirubin Urine: NEGATIVE
Glucose, UA: NEGATIVE mg/dL
Ketones, ur: NEGATIVE mg/dL
Leukocytes, UA: NEGATIVE
Nitrite: NEGATIVE
Protein, ur: NEGATIVE mg/dL
Specific Gravity, Urine: 1.024 (ref 1.005–1.030)
pH: 6 (ref 5.0–8.0)

## 2017-06-27 LAB — WET PREP, GENITAL
Clue Cells Wet Prep HPF POC: NONE SEEN
Sperm: NONE SEEN
Trich, Wet Prep: NONE SEEN
Yeast Wet Prep HPF POC: NONE SEEN

## 2017-06-27 LAB — POCT PREGNANCY, URINE: Preg Test, Ur: NEGATIVE

## 2017-06-27 LAB — HCG, SERUM, QUALITATIVE: Preg, Serum: NEGATIVE

## 2017-06-27 NOTE — MAU Provider Note (Signed)
Chief Complaint: Nausea; Vaginal Discharge; and Groin Pain   None    SUBJECTIVE HPI: Caitlyn Roy is a 32 y.o. [id number] at Unknown who presents to Maternity Admissions reporting left groin pain.  Concerned about ectopic pregnancy or STD.  Uses no birth control.    Location: left groin area Quality: cramping Severity: 4/10 on pain scale Duration: today   Past Medical History:  Diagnosis Date  . Anemia   . Anxiety   . 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 Tubal pregnancy   . Vaginal odor   . Vaginitis    OB History  Gravida Para Term Preterm AB Living  7 1 1   6 1   SAB TAB Ectopic Multiple Live Births  4 1 1   1     # Outcome Date GA Lbr Len/2nd Weight Sex Delivery Anes PTL Lv  7 Ectopic 2014          6 Term 2010    M CS-Unspec   LIV  5 TAB           4 SAB           3 SAB           2 SAB           1 SAB              Past Surgical History:  Procedure Laterality Date  . BIOPSY EYE MUSCLE    . CESAREAN SECTION  2010  . LAPAROSCOPIC APPENDECTOMY N/A 10/20/2016   Procedure: APPENDECTOMY LAPAROSCOPIC;  Surgeon: Judeth Horn, MD;  Location: [facility];  Service: General;  Laterality: N/A;  . laporoscopy     Social History   Socioeconomic History  . Marital status: Single    Spouse name: Not on file  . Number of children: 1  . Years of education: college   . Highest education level: Not on file  Social Needs  . Financial resource strain: Not on file  . Food insecurity - worry: Not on file  . Food insecurity - inability: Not on file  . Transportation needs - medical: Not on file  . Transportation needs - non-medical: Not on file  Occupational History  . Not on file  Tobacco Use  . Smoking status: Never Smoker  . Smokeless tobacco: Never Used  Substance and Sexual Activity  . Alcohol use: No    Frequency: Never  . Drug use: No  . Sexual activity: Yes    Birth control/protection: Condom    Comment:  Plans to have [place] IUD placed in January.  Other Topics Concern  . Not on file  Social History Narrative   Lives at home with son   Drinks no caffeine    Family History  Problem Relation Age of Onset  . Hypertension Mother   . Breast cancer Mother   . Heart attack Mother   . Alcohol abuse Father    No current facility-administered medications on file prior to encounter.    Current Outpatient Medications on File Prior to Encounter  Medication Sig Dispense Refill  . acetaminophen (TYLENOL) 325 MG tablet Take 2 tablets (650 mg total) by mouth every 8 (eight) hours. (Patient not taking: Reported on 11/05/2016)    . ALPRAZolam (XANAX) 0.5 MG tablet Take 1 tablet (0.5 mg total) by mouth 3 (three) times daily as needed for anxiety. 11 tablet 0  . docusate sodium (COLACE) 100 MG capsule Take  1 capsule (100 mg total) by mouth every 12 (twelve) hours. (Patient not taking: Reported on 12/30/2016) 60 capsule 0  . escitalopram (LEXAPRO) 10 MG tablet Take 10 mg by mouth daily.    . hydrOXYzine (ATARAX/VISTARIL) 25 MG tablet Take 1 tablet (25 mg total) by mouth every 6 (six) hours. 12 tablet 0  . ibuprofen (ADVIL,MOTRIN) 800 MG tablet Take 1 tablet (800 mg total) by mouth every 8 (eight) hours. (Patient not taking: Reported on 11/05/2016) 30 tablet 0  . ibuprofen (ADVIL,MOTRIN) 800 MG tablet Take 1 tablet (800 mg total) by mouth every 8 (eight) hours as needed for moderate pain. 20 tablet 1  . polyethylene glycol (MIRALAX / GLYCOLAX) packet Take 17 g by mouth daily as needed for mild constipation. (Patient not taking: Reported on 11/05/2016) 14 each 0   Allergies  Allergen Reactions  . Oxycodone Shortness Of Breath and Itching    I have reviewed patient's Past Medical Hx, Surgical Hx, Family Hx, Social Hx, medications and allergies.   Review of Systems  Constitutional: Negative.   HENT: Negative.   Eyes: Negative.   Respiratory: Negative.   Cardiovascular: Negative.   Gastrointestinal: Negative.    Genitourinary:       Left groin pain  Musculoskeletal: Negative.   Allergic/Immunologic: Negative.   Neurological: Negative.   Hematological: Negative.   Psychiatric/Behavioral: Negative.     OBJECTIVE Patient Vitals for the past 24 hrs:  BP Temp Temp src Pulse Resp SpO2 Weight  06/27/17 1830 129/73 98.2 F (36.8 C) Oral 88 17 100 % 76.7 kg (169 lb)   Constitutional: Well-developed, well-nourished female in no acute distress.  Cardiovascular: normal rate Respiratory: normal rate and effort.  GI: Abd soft, non-tender, gravid appropriate for gestational age. Pos BS x 4 MS: Extremities nontender, no edema, normal ROM Neurologic: Alert and oriented x 4.  GU: Neg CVAT.  SPECULUM EXAM: NEFG,small whitedischarge, no blood noted, cervix clean  BIMANUAL: cervix long nontender uterus normal size, no adnexal tenderness or masses.    LAB RESULTS Results for orders placed or performed during the hospital encounter of 06/27/17 (from the past 24 hour(s))  Urinalysis, Routine w reflex microscopic     Status: Abnormal   Collection Time: 06/27/17  6:32 PM  Result Value Ref Range   Color, Urine YELLOW YELLOW   APPearance HAZY (A) CLEAR   Specific Gravity, Urine 1.024 1.005 - 1.030   pH 6.0 5.0 - 8.0   Glucose, UA NEGATIVE NEGATIVE mg/dL   Hgb urine dipstick SMALL (A) NEGATIVE   Bilirubin Urine NEGATIVE NEGATIVE   Ketones, ur NEGATIVE NEGATIVE mg/dL   Protein, ur NEGATIVE NEGATIVE mg/dL   Nitrite NEGATIVE NEGATIVE   Leukocytes, UA NEGATIVE NEGATIVE   RBC / HPF 0-5 0 - 5 RBC/hpf   WBC, UA 0-5 0 - 5 WBC/hpf   Bacteria, UA NONE SEEN NONE SEEN   Squamous Epithelial / LPF 6-30 (A) NONE SEEN   Mucus PRESENT   Pregnancy, urine POC     Status: None   Collection Time: 06/27/17  7:11 PM  Result Value Ref Range   Preg Test, Ur NEGATIVE NEGATIVE  hCG, serum, qualitative     Status: None   Collection Time: 06/27/17  7:53 PM  Result Value Ref Range   Preg, Serum NEGATIVE NEGATIVE   Results for  orders placed or performed during the hospital encounter of 06/27/17 (from the past 24 hour(s))  Urinalysis, Routine w reflex microscopic     Status: Abnormal  Collection Time: 06/27/17  6:32 PM  Result Value Ref Range   Color, Urine YELLOW YELLOW   APPearance HAZY (A) CLEAR   Specific Gravity, Urine 1.024 1.005 - 1.030   pH 6.0 5.0 - 8.0   Glucose, UA NEGATIVE NEGATIVE mg/dL   Hgb urine dipstick SMALL (A) NEGATIVE   Bilirubin Urine NEGATIVE NEGATIVE   Ketones, ur NEGATIVE NEGATIVE mg/dL   Protein, ur NEGATIVE NEGATIVE mg/dL   Nitrite NEGATIVE NEGATIVE   Leukocytes, UA NEGATIVE NEGATIVE   RBC / HPF 0-5 0 - 5 RBC/hpf   WBC, UA 0-5 0 - 5 WBC/hpf   Bacteria, UA NONE SEEN NONE SEEN   Squamous Epithelial / LPF 6-30 (A) NONE SEEN   Mucus PRESENT   Pregnancy, urine POC     Status: None   Collection Time: 06/27/17  7:11 PM  Result Value Ref Range   Preg Test, Ur NEGATIVE NEGATIVE  hCG, serum, qualitative     Status: None   Collection Time: 06/27/17  7:53 PM  Result Value Ref Range   Preg, Serum NEGATIVE NEGATIVE  Wet prep, genital     Status: Abnormal   Collection Time: 06/27/17  9:20 PM  Result Value Ref Range   Yeast Wet Prep HPF POC NONE SEEN NONE SEEN   Trich, Wet Prep NONE SEEN NONE SEEN   Clue Cells Wet Prep HPF POC NONE SEEN NONE SEEN   WBC, Wet Prep HPF POC FEW (A) NONE SEEN   Sperm NONE SEEN    IMAGING Dg Chest 2 View  Result Date: 06/09/2017 CLINICAL DATA:  Productive cough. EXAM: CHEST  2 VIEW COMPARISON:  12/30/2016 FINDINGS: Cardiomediastinal silhouette is normal. Mediastinal contours appear intact. There is no evidence of focal airspace consolidation, pleural effusion or pneumothorax. Osseous structures are without acute abnormality. Soft tissues are grossly normal. IMPRESSION: No active cardiopulmonary disease. Electronically Signed   By: Fidela Salisbury M.D.   On: 06/09/2017 17:42    MAU COURSE Orders Placed This Encounter  Procedures  . Wet prep, genital  .  Urinalysis, Routine w reflex microscopic  . hCG, serum, qualitative  . Pregnancy, urine POC   No orders of the defined types were placed in this encounter.   MDM PE and exam.  Will send cultures on pt for STDs. ASSESSMENT Groin pain  PLAN Discussed otc pain relief options.  Discussed making appt with office to discuss birth control options. Discharge home in stable condition.      Starla Link, CNM 06/27/2017  9:34 PM

## 2017-06-27 NOTE — MAU Note (Signed)
Having pain in her groin, mostly left side.  Has been nauseated.  Started 2 days ago.  Hx of ectopic preg.  "with all of her pregnancies, it never shows up on a urine test, so would like to ask dr for a blood test." also having a white d/c, no odor or irritation.

## 2017-06-28 LAB — GC/CHLAMYDIA PROBE AMP (~~LOC~~) NOT AT ARMC
Chlamydia: NEGATIVE
Neisseria Gonorrhea: NEGATIVE

## 2017-08-14 ENCOUNTER — Encounter: Payer: Self-pay | Admitting: Internal Medicine

## 2017-08-16 ENCOUNTER — Encounter: Payer: Self-pay | Admitting: Internal Medicine

## 2017-08-16 ENCOUNTER — Ambulatory Visit (INDEPENDENT_AMBULATORY_CARE_PROVIDER_SITE_OTHER): Payer: 59 | Admitting: Internal Medicine

## 2017-08-16 ENCOUNTER — Encounter (INDEPENDENT_AMBULATORY_CARE_PROVIDER_SITE_OTHER): Payer: Self-pay

## 2017-08-16 VITALS — BP 100/50 | HR 81 | Ht 62.0 in | Wt 169.0 lb

## 2017-08-16 DIAGNOSIS — E049 Nontoxic goiter, unspecified: Secondary | ICD-10-CM | POA: Insufficient documentation

## 2017-08-16 DIAGNOSIS — R002 Palpitations: Secondary | ICD-10-CM

## 2017-08-16 LAB — BASIC METABOLIC PANEL
BUN/Creatinine Ratio: 9 (ref 9–23)
BUN: 7 mg/dL (ref 6–20)
CO2: 22 mmol/L (ref 20–29)
Calcium: 9.7 mg/dL (ref 8.7–10.2)
Chloride: 100 mmol/L (ref 96–106)
Creatinine, Ser: 0.78 mg/dL (ref 0.57–1.00)
GFR calc Af Amer: 117 mL/min/{1.73_m2} (ref >59)
GFR calc non Af Amer: 102 mL/min/{1.73_m2} (ref >59)
Glucose: 87 mg/dL (ref 65–99)
Potassium: 4.3 mmol/L (ref 3.5–5.2)
Sodium: 136 mmol/L (ref 134–144)

## 2017-08-16 LAB — MAGNESIUM: Magnesium: 1.8 mg/dL (ref 1.6–2.3)

## 2017-08-16 LAB — TSH: TSH: 0.845 u[IU]/mL (ref 0.450–4.500)

## 2017-08-16 NOTE — Patient Instructions (Signed)
Medication Instructions:  Your physician recommends that you continue on your current medications as directed. Please refer to the Current Medication list given to you today.  Labwork: BMET, TSH and Magnesium today  Testing/Procedures: None  Follow-Up: Your physician recommends that you schedule a follow-up appointment in: 3 months with Dr. Saunders Revel or a PA or NP.    Any Other Special Instructions Will Be Listed Below (If Applicable).     If you need a refill on your cardiac medications before your next appointment, please call your pharmacy.

## 2017-08-16 NOTE — Progress Notes (Signed)
Follow-up Outpatient Visit Date: 08/16/2017  Primary Care Provider: Jonathon Jordan, MD [place] [address] [address]  Chief Complaint: Chest fluttering  HPI:  Caitlyn Roy is a 32 y.o. year-old female with history of recurrent chest pain, palpitations, orbital pseudotumor, and anxiety, who presents for follow-up of chest pain.  I last saw her in 06/2016.  Previous ETT was normal.  Holter monitor showed PAC's and brief tachycardia that could represent sinus tachycardia versus atrial tachycardia.  I recommended a trial of low-dose beta-blocker.  She subsequently returned to see Truitt Merle, NP, in 08/2016 due to acute chest pain.  Echo was ordered and showed normal LVEF with mild calcification of the aortic and mitral valves.  Today, Caitlyn Roy presents for evaluation of a jittery sensation and fluttering in the chest.  She is experienced in the past with low potassium.  It occasionally will wake her up when she is napping or in the early morning.  She feels jittery throughout her body and also feels like her heart is racing.  The sensation gradually resolves over the course of a few minutes.  She has not had any further episodes of chest pain.  She also denies shortness of breath, lightheadedness, orthopnea, PND, or edema.  Caitlyn Roy is pregnant, due in mid December.  This is her second pregnancy.  She is not consuming caffeine, tobacco products, or alcohol.  --------------------------------------------------------------------------------------------------  Cardiovascular History & Procedures: Cardiovascular Problems:  Palpitations  Atypical chest pain  Risk Factors:  None  Cath/PCI:  None  CV Surgery:  None  EP Procedures and Devices:  48-hour Holter monitor: Predominantly sinus rhythm (average rate 93 bpm) with rare PACs and two runs of SVT lasting up to 4 beats. No ventricular ectopy, sustained arrhythmias, or prolonged pauses. Diary  events correspond to normal sinus rhythm and sinus arrhythmia.  Non-Invasive Evaluation(s):  TTE (01/12/17): Normal LV size with mild LVH.  LVEF 60-65% with normal wall motion.  Mild calcification of the aortic and mitral valves.  Normal RV size and function.  ETT (07/12/16): Normal study with significant ST/T changes or arrhythmia.  Patient exercised 6:30 minutes (7.7 METS) achieving 181 bpm (95% MPHR).  Recent CV Pertinent Labs: Lab Results  Component Value Date   K 3.9 06/09/2017   K 3.1 (L) 10/17/2013   BUN 8 06/09/2017   BUN 11 08/28/2016   BUN 8 10/17/2013   CREATININE 0.87 06/09/2017   CREATININE 0.88 10/17/2013    Past medical and surgical history were reviewed and updated in EPIC.  Current Meds  Medication Sig  . Prenatal Vit-Fe Fumarate-FA (MULTIVITAMIN-PRENATAL) 27-0.8 MG TABS tablet Take 1 tablet by mouth daily at 12 noon.    Allergies: Oxycodone  Social History   Socioeconomic History  . Marital status: Single    Spouse name: Not on file  . Number of children: 1  . Years of education: college   . Highest education level: Not on file  Occupational History  . Not on file  Social Needs  . Financial resource strain: Not on file  . Food insecurity:    Worry: Not on file    Inability: Not on file  . Transportation needs:    Medical: Not on file    Non-medical: Not on file  Tobacco Use  . Smoking status: Never Smoker  . Smokeless tobacco: Never Used  Substance and Sexual Activity  . Alcohol use: No    Frequency: Never  . Drug use: No  .  Sexual activity: Yes    Birth control/protection: Condom    Comment: Plans to have Taylor IUD placed in January.  Lifestyle  . Physical activity:    Days per week: Not on file    Minutes per session: Not on file  . Stress: Not on file  Relationships  . Social connections:    Talks on phone: Not on file    Gets together: Not on file    Attends religious service: Not on file    Active member of club or organization:  Not on file    Attends meetings of clubs or organizations: Not on file    Relationship status: Not on file  . Intimate partner violence:    Fear of current or ex partner: Not on file    Emotionally abused: Not on file    Physically abused: Not on file    Forced sexual activity: Not on file  Other Topics Concern  . Not on file  Social History Narrative   Lives at home with son   Drinks no caffeine     Family History  Problem Relation Age of Onset  . Hypertension Mother   . Breast cancer Mother   . Heart attack Mother   . Alcohol abuse Father     Review of Systems: Mild swelling at lower part of neck noted.  Otherwise, a 12-system review of systems was performed and was negative except as noted in the HPI.  --------------------------------------------------------------------------------------------------  Physical Exam: BP (!) 100/50   Pulse 81   Ht 5\' 2"  (1.575 m)   Wt 169 lb (76.7 kg)   SpO2 96%   BMI 30.91 kg/m   General: NAD. HEENT: No conjunctival pallor or scleral icterus. Moist mucous membranes.  OP clear. Neck: Supple without lymphadenopathy.  Mild diffuse thyroid enlargement without nodularity or tenderness noted.  No JVD or HJR. Lungs: Normal work of breathing. Clear to auscultation bilaterally without wheezes or crackles. Heart: Regular rate and rhythm without murmurs, rubs, or gallops. Non-displaced PMI. Abd: Bowel sounds present. Soft, NT/ND without hepatosplenomegaly Ext: No lower extremity edema. Radial, PT, and DP pulses are 2+ bilaterally. Skin: Warm and dry without rash.  EKG: Normal sinus rhythm without abnormalities.  Lab Results  Component Value Date   WBC 6.0 06/09/2017   HGB 11.3 (L) 06/09/2017   HCT 35.6 (L) 06/09/2017   MCV 85.2 06/09/2017   PLT 410 (H) 06/09/2017    Lab Results  Component Value Date   NA 136 06/09/2017   K 3.9 06/09/2017   CL 102 06/09/2017   CO2 23 06/09/2017   BUN 8 06/09/2017   CREATININE 0.87 06/09/2017    GLUCOSE 90 06/09/2017   ALT 23 11/05/2016    No results found for: CHOL, HDL, LDLCALC, LDLDIRECT, TRIG, CHOLHDL  --------------------------------------------------------------------------------------------------  ASSESSMENT AND PLAN: Palpitations Uncertain etiology, primarily described as jittery feeling when Caitlyn Roy wakes up with accompanying racing of the heartbeat.  Symptoms resolved spontaneously after a few minutes.  There are no proceeded symptoms.  Exam today is unremarkable except for mild diffuse thyroid enlargement.  I will check her electrolytes today as well as a TSH.  Assuming these studies are normal, no further workup or treatment is recommended at this time.  Goiter Mild diffuse thyroid enlargement noted on exam today.  Caitlyn Roy has noted some mild swelling in her neck in this area.  I will check a TSH today.  If this is abnormal, she will need to follow-up with her PCP  for further management.  Follow-up: Return to clinic in 3 months.  Nelva Bush, MD 08/16/2017 8:47 AM

## 2017-08-18 ENCOUNTER — Encounter (HOSPITAL_COMMUNITY): Payer: Self-pay | Admitting: *Deleted

## 2017-08-18 ENCOUNTER — Other Ambulatory Visit: Payer: Self-pay

## 2017-08-18 ENCOUNTER — Inpatient Hospital Stay (HOSPITAL_COMMUNITY)
Admission: AD | Admit: 2017-08-18 | Discharge: 2017-08-19 | DRG: 776 | Disposition: A | Discharge status: Home / Self Care | Payer: 59 | Source: Ambulatory Visit | Attending: Obstetrics & Gynecology | Admitting: Obstetrics & Gynecology

## 2017-08-18 DIAGNOSIS — Z3A01 Less than 8 weeks gestation of pregnancy: Secondary | ICD-10-CM

## 2017-08-18 DIAGNOSIS — O26891 Other specified pregnancy related conditions, first trimester: Secondary | ICD-10-CM | POA: Diagnosis present

## 2017-08-18 DIAGNOSIS — R52 Pain, unspecified: Secondary | ICD-10-CM | POA: Diagnosis present

## 2017-08-18 DIAGNOSIS — O9989 Other specified diseases and conditions complicating pregnancy, childbirth and the puerperium: Secondary | ICD-10-CM | POA: Diagnosis not present

## 2017-08-18 DIAGNOSIS — O209 Hemorrhage in early pregnancy, unspecified: Secondary | ICD-10-CM | POA: Diagnosis not present

## 2017-08-18 DIAGNOSIS — R109 Unspecified abdominal pain: Secondary | ICD-10-CM | POA: Diagnosis not present

## 2017-08-18 LAB — CBC
HCT: 31.9 % — ABNORMAL LOW (ref 36.0–46.0)
Hemoglobin: 10.5 g/dL — ABNORMAL LOW (ref 12.0–15.0)
MCH: 27.2 pg (ref 26.0–34.0)
MCHC: 32.9 g/dL (ref 30.0–36.0)
MCV: 82.6 fL (ref 78.0–100.0)
Platelets: 374 10*3/uL (ref 150–400)
RBC: 3.86 MIL/uL — ABNORMAL LOW (ref 3.87–5.11)
RDW: 14.7 % (ref 11.5–15.5)
WBC: 8.8 10*3/uL (ref 4.0–10.5)

## 2017-08-18 LAB — URINALYSIS, ROUTINE W REFLEX MICROSCOPIC
Bilirubin Urine: NEGATIVE
Glucose, UA: NEGATIVE mg/dL
Hgb urine dipstick: NEGATIVE
Ketones, ur: NEGATIVE mg/dL
Leukocytes, UA: NEGATIVE
Nitrite: NEGATIVE
Protein, ur: NEGATIVE mg/dL
Specific Gravity, Urine: 1.028 (ref 1.005–1.030)
pH: 6 (ref 5.0–8.0)

## 2017-08-18 LAB — COMPREHENSIVE METABOLIC PANEL
ALT: 17 U/L (ref 14–54)
AST: 18 U/L (ref 15–41)
Albumin: 4 g/dL (ref 3.5–5.0)
Alkaline Phosphatase: 75 U/L (ref 38–126)
Anion gap: 8 (ref 5–15)
BUN: 11 mg/dL (ref 6–20)
CO2: 26 mmol/L (ref 22–32)
Calcium: 9.3 mg/dL (ref 8.9–10.3)
Chloride: 102 mmol/L (ref 101–111)
Creatinine, Ser: 0.72 mg/dL (ref 0.44–1.00)
GFR calc Af Amer: >60 mL/min (ref >60)
GFR calc non Af Amer: >60 mL/min (ref >60)
Glucose, Bld: 95 mg/dL (ref 65–99)
Potassium: 3.9 mmol/L (ref 3.5–5.1)
Sodium: 136 mmol/L (ref 135–145)
Total Bilirubin: 0.2 mg/dL — ABNORMAL LOW (ref 0.3–1.2)
Total Protein: 7.4 g/dL (ref 6.5–8.1)

## 2017-08-18 NOTE — MAU Note (Addendum)
Pt reports a dull pain that "travels" from her leg to abdomen, to arm, to back. All pain has been on L side. Pt reports pain is currently in the L side of her back. Denies LOF or bleeding. Has an appointment w/ Dr. Margie Billet on Monday. Has a Note from Dr Stephanie Acre stating that she has a + UPT

## 2017-08-18 NOTE — MAU Provider Note (Signed)
History     CSN: [id number]  Arrival date and time: 08/18/17 2105   First Provider Initiated Contact with Patient 08/18/17 2252      Chief Complaint  Patient presents with  . Shoulder Pain   HPI Caitlyn Roy is a 32 y.o. [phone] at [redacted]w[redacted]d who presents with pain that she states travels. She states 2 days ago she started noticing a dull pain in her arm that travelled to her arm and is sometimes in her back and neck. She states currently the sensation is in her shoulder. She denies any abdominal pain, vaginal bleeding or discharge.   She is followed by a cardiologist for a history of recurrent chest pain, palpitations and anxiety. She denies any of the symptoms currently. She states the last time she had similar symptoms was when her anxiety "flared up" but states she doesn't feel like that is the case today.   OB History    Gravida  8   Para  1   Term  1   Preterm      AB  6   Living  1     SAB  4   TAB  1   Ectopic  1   Multiple      Live Births  1           Past Medical History:  Diagnosis Date  . Anemia   . Anxiety   . 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 Tubal pregnancy   . Vaginal odor   . Vaginitis     Past Surgical History:  Procedure Laterality Date  . BIOPSY EYE MUSCLE    . CESAREAN SECTION  2010  . LAPAROSCOPIC APPENDECTOMY N/A 10/20/2016   Procedure: APPENDECTOMY LAPAROSCOPIC;  Surgeon: Judeth Horn, MD;  Location: [facility];  Service: General;  Laterality: N/A;  . laporoscopy      Family History  Problem Relation Age of Onset  . Hypertension Mother   . Breast cancer Mother   . Heart attack Mother   . Alcohol abuse Father     Social History   Tobacco Use  . Smoking status: Never Smoker  . Smokeless tobacco: Never Used  Substance Use Topics  . Alcohol use: No    Frequency: Never  . Drug use: No    Allergies:  Allergies  Allergen Reactions  . Oxycodone  Shortness Of Breath and Itching    Medications Prior to Admission  Medication Sig Dispense Refill Last Dose  . Prenatal Vit-Fe Fumarate-FA (MULTIVITAMIN-PRENATAL) 27-0.8 MG TABS tablet Take 1 tablet by mouth daily at 12 noon.   Taking    Review of Systems  Constitutional: Negative.  Negative for fatigue and fever.  HENT: Negative.   Respiratory: Negative.  Negative for shortness of breath.   Cardiovascular: Negative.  Negative for chest pain.  Gastrointestinal: Negative.  Negative for abdominal pain, constipation, diarrhea, nausea and vomiting.  Genitourinary: Negative.  Negative for dysuria.  Musculoskeletal:       Shoulder pain  Neurological: Negative.  Negative for dizziness and headaches.   Physical Exam   Blood pressure 120/68, temperature 99.2 F (37.3 C), temperature source Oral, resp. rate 18, height 5\' 2"  (1.575 m), weight 168 lb 12 oz (76.5 kg), last menstrual period 07/12/2017, SpO2 100 %.  Physical Exam  Nursing note and vitals reviewed. Constitutional: She is oriented to person, place, and time. She appears well-developed and well-nourished. No distress.  HENT:  Head: Normocephalic.  Eyes: Pupils are equal, round, and reactive to light.  Cardiovascular: Normal rate, regular rhythm and normal heart sounds.  Respiratory: Effort normal and breath sounds normal. No respiratory distress.  GI: Soft. Bowel sounds are normal. She exhibits no distension. There is no tenderness.  Neurological: She is alert and oriented to person, place, and time. She has normal reflexes. She displays normal reflexes. No cranial nerve deficit.  Skin: Skin is warm and dry.  Psychiatric: She has a normal mood and affect. Her behavior is normal. Judgment and thought content normal.    MAU Course  Procedures Results for orders placed or performed during the hospital encounter of 08/18/17 (from the past 24 hour(s))  Urinalysis, Routine w reflex microscopic     Status: Abnormal   Collection  Time: 08/18/17  9:30 PM  Result Value Ref Range   Color, Urine YELLOW YELLOW   APPearance HAZY (A) CLEAR   Specific Gravity, Urine 1.028 1.005 - 1.030   pH 6.0 5.0 - 8.0   Glucose, UA NEGATIVE NEGATIVE mg/dL   Hgb urine dipstick NEGATIVE NEGATIVE   Bilirubin Urine NEGATIVE NEGATIVE   Ketones, ur NEGATIVE NEGATIVE mg/dL   Protein, ur NEGATIVE NEGATIVE mg/dL   Nitrite NEGATIVE NEGATIVE   Leukocytes, UA NEGATIVE NEGATIVE  CBC     Status: Abnormal   Collection Time: 08/18/17 11:05 PM  Result Value Ref Range   WBC 8.8 4.0 - 10.5 K/uL   RBC 3.86 (L) 3.87 - 5.11 MIL/uL   Hemoglobin 10.5 (L) 12.0 - 15.0 g/dL   HCT 31.9 (L) 36.0 - 46.0 %   MCV 82.6 78.0 - 100.0 fL   MCH 27.2 26.0 - 34.0 pg   MCHC 32.9 30.0 - 36.0 g/dL   RDW 14.7 11.5 - 15.5 %   Platelets 374 150 - 400 K/uL  Comprehensive metabolic panel     Status: Abnormal   Collection Time: 08/18/17 11:05 PM  Result Value Ref Range   Sodium 136 135 - 145 mmol/L   Potassium 3.9 3.5 - 5.1 mmol/L   Chloride 102 101 - 111 mmol/L   CO2 26 22 - 32 mmol/L   Glucose, Bld 95 65 - 99 mg/dL   BUN 11 6 - 20 mg/dL   Creatinine, Ser 0.72 0.44 - 1.00 mg/dL   Calcium 9.3 8.9 - 10.3 mg/dL   Total Protein 7.4 6.5 - 8.1 g/dL   Albumin 4.0 3.5 - 5.0 g/dL   AST 18 15 - 41 U/L   ALT 17 14 - 54 U/L   Alkaline Phosphatase 75 38 - 126 U/L   Total Bilirubin 0.2 (L) 0.3 - 1.2 mg/dL   GFR calc non Af Amer >60 >60 mL/min   GFR calc Af Amer >60 >60 mL/min   Anion gap 8 5 - 15   MDM UA Patient refused pregnancy test in MAU. Brought paperwork from 39 office with positive UPT result and states she doesn't "want to be charged for a repeat of a test I've already had done." CBC, CMP ED EKG- normal sinus rhythm  Consulted with Dr. Alesia Richards- ok to discharge patient home and encourage patient to follow up with PCP for ongoing symptoms. Encourage patient to use tylenol for pain at home.   Assessment and Plan   1. Generalized pain   2. [redacted] weeks  gestation of pregnancy    -Discharge home in stable condition -Encouraged patient to use tylenol for pain management at home -First trimester precautions discussed -Patient advised to  follow-up with [facility] as scheduled on Monday and PCP if symptoms continue -Patient may return to MAU as needed or if her condition were to change or worsen  Wende Mott CNM 08/18/2017, 10:56 PM

## 2017-08-19 ENCOUNTER — Inpatient Hospital Stay (HOSPITAL_COMMUNITY): Payer: 59

## 2017-08-19 ENCOUNTER — Inpatient Hospital Stay (HOSPITAL_COMMUNITY)
Admission: AD | Admit: 2017-08-19 | Discharge: 2017-08-19 | Disposition: A | Discharge status: Home / Self Care | Payer: 59 | Source: Ambulatory Visit | Attending: Obstetrics and Gynecology | Admitting: Obstetrics and Gynecology

## 2017-08-19 ENCOUNTER — Encounter (HOSPITAL_COMMUNITY): Payer: Self-pay | Admitting: *Deleted

## 2017-08-19 DIAGNOSIS — Z3491 Encounter for supervision of normal pregnancy, unspecified, first trimester: Secondary | ICD-10-CM

## 2017-08-19 DIAGNOSIS — R52 Pain, unspecified: Secondary | ICD-10-CM | POA: Diagnosis not present

## 2017-08-19 DIAGNOSIS — O9989 Other specified diseases and conditions complicating pregnancy, childbirth and the puerperium: Secondary | ICD-10-CM

## 2017-08-19 DIAGNOSIS — O208 Other hemorrhage in early pregnancy: Secondary | ICD-10-CM

## 2017-08-19 DIAGNOSIS — R109 Unspecified abdominal pain: Secondary | ICD-10-CM | POA: Diagnosis not present

## 2017-08-19 DIAGNOSIS — O418X10 Other specified disorders of amniotic fluid and membranes, first trimester, not applicable or unspecified: Secondary | ICD-10-CM

## 2017-08-19 DIAGNOSIS — Z8619 Personal history of other infectious and parasitic diseases: Secondary | ICD-10-CM | POA: Insufficient documentation

## 2017-08-19 DIAGNOSIS — O209 Hemorrhage in early pregnancy, unspecified: Secondary | ICD-10-CM

## 2017-08-19 DIAGNOSIS — Z9889 Other specified postprocedural states: Secondary | ICD-10-CM | POA: Insufficient documentation

## 2017-08-19 DIAGNOSIS — Z885 Allergy status to narcotic agent status: Secondary | ICD-10-CM | POA: Insufficient documentation

## 2017-08-19 DIAGNOSIS — O468X1 Other antepartum hemorrhage, first trimester: Secondary | ICD-10-CM

## 2017-08-19 DIAGNOSIS — Z3A01 Less than 8 weeks gestation of pregnancy: Secondary | ICD-10-CM

## 2017-08-19 LAB — CBC
HCT: 32.8 % — ABNORMAL LOW (ref 36.0–46.0)
Hemoglobin: 10.6 g/dL — ABNORMAL LOW (ref 12.0–15.0)
MCH: 26.8 pg (ref 26.0–34.0)
MCHC: 32.3 g/dL (ref 30.0–36.0)
MCV: 82.8 fL (ref 78.0–100.0)
Platelets: 379 10*3/uL (ref 150–400)
RBC: 3.96 MIL/uL (ref 3.87–5.11)
RDW: 14.6 % (ref 11.5–15.5)
WBC: 7.8 10*3/uL (ref 4.0–10.5)

## 2017-08-19 LAB — HCG, QUANTITATIVE, PREGNANCY: hCG, Beta Chain, Quant, S: 48101 m[IU]/mL — ABNORMAL HIGH (ref <5)

## 2017-08-19 LAB — WET PREP, GENITAL
Clue Cells Wet Prep HPF POC: NONE SEEN
Sperm: NONE SEEN
Trich, Wet Prep: NONE SEEN
Yeast Wet Prep HPF POC: NONE SEEN

## 2017-08-19 MED ORDER — RHO D IMMUNE GLOBULIN 1500 UNIT/2ML IJ SOSY
300.0000 ug | PREFILLED_SYRINGE | Freq: Once | INTRAMUSCULAR | Status: AC
  Administered 2017-08-19: 300 ug via INTRAMUSCULAR
  Filled 2017-08-19: qty 2

## 2017-08-19 NOTE — MAU Note (Signed)
Pt here with c/o vaginal bleeding and cramping that started this morning. Also having left arm pain.

## 2017-08-19 NOTE — Discharge Instructions (Signed)
Safe Medications in Pregnancy   Acne: Benzoyl Peroxide Salicylic Acid  Backache/Headache: Tylenol: 2 regular strength every 4 hours OR              2 Extra strength every 6 hours  Colds/Coughs/Allergies: Benadryl (alcohol free) 25 mg every 6 hours as needed Breath right strips Claritin Cepacol throat lozenges Chloraseptic throat spray Cold-Eeze- up to three times per day Cough drops, alcohol free Flonase (by prescription only) Guaifenesin Mucinex Robitussin DM (plain only, alcohol free) Saline nasal spray/drops Sudafed (pseudoephedrine) & Actifed ** use only after [redacted] weeks gestation and if you do not have high blood pressure Tylenol Vicks Vaporub Zinc lozenges Zyrtec   Constipation: Colace Ducolax suppositories Fleet enema Glycerin suppositories Metamucil Milk of magnesia Miralax Senokot Smooth move tea  Diarrhea: Kaopectate Imodium A-D  *NO pepto Bismol  Hemorrhoids: Anusol Anusol HC Preparation H Tucks  Indigestion: Tums Maalox Mylanta Zantac  Pepcid  Insomnia: Benadryl (alcohol free) 25mg  every 6 hours as needed Tylenol PM Unisom, no Gelcaps  Leg Cramps: Tums MagGel  Nausea/Vomiting:  Bonine Dramamine Emetrol Ginger extract Sea bands Meclizine  Nausea medication to take during pregnancy:  Unisom (doxylamine succinate 25 mg tablets) Take one tablet daily at bedtime. If symptoms are not adequately controlled, the dose can be increased to a maximum recommended dose of two tablets daily (1/2 tablet in the morning, 1/2 tablet mid-afternoon and one at bedtime). Vitamin B6 100mg  tablets. Take one tablet twice a day (up to 200 mg per day).  Skin Rashes: Aveeno products Benadryl cream or 25mg  every 6 hours as needed Calamine Lotion 1% cortisone cream  Yeast infection: Gyne-lotrimin 7 Monistat 7   **If taking multiple medications, please check labels to avoid duplicating the same active ingredients **take medication as directed on  the label ** Do not exceed 4000 mg of tylenol in 24 hours **Do not take medications that contain aspirin or ibuprofen    Primary care follow up  [facility] (will see you even if you do not have sickle cell): [facility]: [facility]: [phone]

## 2017-08-19 NOTE — Discharge Instructions (Signed)
Subchorionic Hematoma A subchorionic hematoma is a gathering of blood between the outer wall of the placenta and the inner wall of the womb (uterus). The placenta is the organ that connects the fetus to the wall of the uterus. The placenta performs the feeding, breathing (oxygen to the fetus), and waste removal (excretory work) of the fetus. Subchorionic hematoma is the most common abnormality found on a result from ultrasonography done during the first trimester or early second trimester of pregnancy. If there has been little or no vaginal bleeding, early small hematomas usually shrink on their own and do not affect your baby or pregnancy. The blood is gradually absorbed over 1-2 weeks. When bleeding starts later in pregnancy or the hematoma is larger or occurs in an older pregnant woman, the outcome may not be as good. Larger hematomas may get bigger, which increases the chances for miscarriage. Subchorionic hematoma also increases the risk of premature detachment of the placenta from the uterus, preterm (premature) labor, and stillbirth. Follow these instructions at home:  Stay on bed rest if your health care provider recommends this. Although bed rest will not prevent more bleeding or prevent a miscarriage, your health care provider may recommend bed rest until you are advised otherwise.  Avoid heavy lifting (more than 10 lb [4.5 kg]), exercise, sexual intercourse, or douching as directed by your health care provider.  Keep track of the number of pads you use each day and how soaked (saturated) they are. Write down this information.  Do not use tampons.  Keep all follow-up appointments as directed by your health care provider. Your health care provider may ask you to have follow-up blood tests or ultrasound tests or both. Get help right away if:  You have severe cramps in your stomach, back, abdomen, or pelvis.  You have a fever.  You pass large clots or tissue. Save any tissue for your  health care provider to look at.  Your bleeding increases or you become lightheaded, feel weak, or have fainting episodes. This information is not intended to replace advice given to you by your health care provider. Make sure you discuss any questions you have with your health care provider. Document Released: 08/09/2006 Document Revised: 09/30/2015 Document Reviewed: 11/21/2012 Elsevier Interactive Patient Education  2017 [place] Medications in Pregnancy   Acne: Benzoyl Peroxide Salicylic Acid  Backache/Headache: Tylenol: 2 regular strength every 4 hours OR              2 Extra strength every 6 hours  Colds/Coughs/Allergies: Benadryl (alcohol free) 25 mg every 6 hours as needed Breath right strips Claritin Cepacol throat lozenges Chloraseptic throat spray Cold-Eeze- up to three times per day Cough drops, alcohol free Flonase (by prescription only) Guaifenesin Mucinex Robitussin DM (plain only, alcohol free) Saline nasal spray/drops Sudafed (pseudoephedrine) & Actifed ** use only after [redacted] weeks gestation and if you do not have high blood pressure Tylenol Vicks Vaporub Zinc lozenges Zyrtec   Constipation: Colace Ducolax suppositories Fleet enema Glycerin suppositories Metamucil Milk of magnesia Miralax Senokot Smooth move tea  Diarrhea: Kaopectate Imodium A-D  *NO pepto Bismol  Hemorrhoids: Anusol Anusol HC Preparation H Tucks  Indigestion: Tums Maalox Mylanta Zantac  Pepcid  Insomnia: Benadryl (alcohol free) 25mg  every 6 hours as needed Tylenol PM Unisom, no Gelcaps  Leg Cramps: Tums MagGel  Nausea/Vomiting:  Bonine Dramamine Emetrol Ginger extract Sea bands Meclizine  Nausea medication to take during pregnancy:  Unisom (doxylamine succinate 25 mg tablets) Take one  tablet daily at bedtime. If symptoms are not adequately controlled, the dose can be increased to a maximum recommended dose of two tablets daily (1/2 tablet  in the morning, 1/2 tablet mid-afternoon and one at bedtime). Vitamin B6 100mg  tablets. Take one tablet twice a day (up to 200 mg per day).  Skin Rashes: Aveeno products Benadryl cream or 25mg  every 6 hours as needed Calamine Lotion 1% cortisone cream  Yeast infection: Gyne-lotrimin 7 Monistat 7   **If taking multiple medications, please check labels to avoid duplicating the same active ingredients **take medication as directed on the label ** Do not exceed 4000 mg of tylenol in 24 hours **Do not take medications that contain aspirin or ibuprofen

## 2017-08-19 NOTE — MAU Provider Note (Signed)
History     CSN: [id number]  Arrival date and time: 08/19/17 2047   First Provider Initiated Contact with Patient 08/19/17 2114     Chief Complaint  Patient presents with  . Vaginal Bleeding  . Abdominal Pain   HPI Caitlyn Roy is a 32 y.o. [phone] at [redacted]w[redacted]d who presents with vaginal bleeding and abdominal pain. She reports a big gush of bright red bleeding this evening with clots. She also reports lower abdominal pain that she rates a 6/10 and has not tried anything for. She still reports the left arm pain that she was seen for in MAU last night.   OB History    Gravida  8   Para  1   Term  1   Preterm      AB  6   Living  1     SAB  4   TAB  1   Ectopic  1   Multiple      Live Births  1           Past Medical History:  Diagnosis Date  . Anemia   . Anxiety   . 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 Tubal pregnancy   . Vaginal odor   . Vaginitis     Past Surgical History:  Procedure Laterality Date  . BIOPSY EYE MUSCLE    . CESAREAN SECTION  2010  . LAPAROSCOPIC APPENDECTOMY N/A 10/20/2016   Procedure: APPENDECTOMY LAPAROSCOPIC;  Surgeon: Judeth Horn, MD;  Location: [place];  Service: General;  Laterality: N/A;  . laporoscopy      Family History  Problem Relation Age of Onset  . Hypertension Mother   . Breast cancer Mother   . Heart attack Mother   . Alcohol abuse Father     Social History   Tobacco Use  . Smoking status: Never Smoker  . Smokeless tobacco: Never Used  Substance Use Topics  . Alcohol use: No    Frequency: Never  . Drug use: No    Allergies:  Allergies  Allergen Reactions  . Oxycodone Shortness Of Breath and Itching    Medications Prior to Admission  Medication Sig Dispense Refill Last Dose  . Prenatal Vit-Fe Fumarate-FA (MULTIVITAMIN-PRENATAL) 27-0.8 MG TABS tablet Take 1 tablet by mouth daily at 12 noon.   08/19/2017 at Unknown time    Review  of Systems  Constitutional: Negative.  Negative for fatigue and fever.  HENT: Negative.   Respiratory: Negative.  Negative for shortness of breath.   Cardiovascular: Negative.  Negative for chest pain.  Gastrointestinal: Positive for abdominal pain. Negative for constipation, diarrhea, nausea and vomiting.  Genitourinary: Positive for vaginal bleeding. Negative for dysuria.  Neurological: Negative.  Negative for dizziness and headaches.   Physical Exam   Blood pressure 126/67, pulse 82, temperature 99 F (37.2 C), temperature source Oral, resp. rate 18, height 5\' 2"  (1.575 m), weight 169 lb (76.7 kg), last menstrual period 07/12/2017, SpO2 100 %.  Physical Exam  Nursing note and vitals reviewed. Constitutional: She is oriented to person, place, and time. She appears well-developed and well-nourished. No distress.  HENT:  Head: Normocephalic.  Eyes: Pupils are equal, round, and reactive to light.  Cardiovascular: Normal rate, regular rhythm and normal heart sounds.  Respiratory: Effort normal and breath sounds normal. No respiratory distress.  GI: Soft. Bowel sounds are normal. She exhibits no distension. There is no tenderness.  Genitourinary:  Genitourinary  Comments: Small amount of dark red blood in vault. Cervix closed, thick and posterior.  Neurological: She is alert and oriented to person, place, and time.  Skin: Skin is warm and dry.  Psychiatric: She has a normal mood and affect. Her behavior is normal. Judgment and thought content normal.    MAU Course  Procedures Results for orders placed or performed during the hospital encounter of 08/19/17 (from the past 24 hour(s))  Wet prep, genital     Status: Abnormal   Collection Time: 08/19/17  9:28 PM  Result Value Ref Range   Yeast Wet Prep HPF POC NONE SEEN NONE SEEN   Trich, Wet Prep NONE SEEN NONE SEEN   Clue Cells Wet Prep HPF POC NONE SEEN NONE SEEN   WBC, Wet Prep HPF POC FEW (A) NONE SEEN   Sperm NONE SEEN   CBC      Status: Abnormal   Collection Time: 08/19/17  9:30 PM  Result Value Ref Range   WBC 7.8 4.0 - 10.5 K/uL   RBC 3.96 3.87 - 5.11 MIL/uL   Hemoglobin 10.6 (L) 12.0 - 15.0 g/dL   HCT 32.8 (L) 36.0 - 46.0 %   MCV 82.8 78.0 - 100.0 fL   MCH 26.8 26.0 - 34.0 pg   MCHC 32.3 30.0 - 36.0 g/dL   RDW 14.6 11.5 - 15.5 %   Platelets 379 150 - 400 K/uL  ABO/Rh     Status: None (Preliminary result)   Collection Time: 08/19/17  9:30 PM  Result Value Ref Range   ABO/RH(D)      O NEG Performed at [facility], [address] [place], [place] [address]   hCG, quantitative, pregnancy     Status: Abnormal   Collection Time: 08/19/17  9:30 PM  Result Value Ref Range   hCG, Beta Chain, Quant, S 48,101 (H) <5 mIU/mL  Rh IG workup (includes ABO/Rh)     Status: None (Preliminary result)   Collection Time: 08/19/17  9:30 PM  Result Value Ref Range   Gestational Age(Wks) 5    ABO/RH(D) O NEG    Antibody Screen      NEG Performed at [facility], [address], [place], [place] [address]    Unit Number [id number]    Blood Component Type RHIG    Unit division 00    Status of Unit ALLOCATED    Transfusion Status OK TO TRANSFUSE    US Ob Less Than 14 Weeks With Ob Transvaginal  Result Date: 08/19/2017 CLINICAL DATA:  Vaginal bleeding and cramping since this morning. Gestational age by last menstrual period 5 weeks and 3 days. EXAM: OBSTETRIC <14 WK [place] AND TRANSVAGINAL OB US TECHNIQUE: Both transabdominal and transvaginal ultrasound examinations were performed for complete evaluation of the gestation as well as the maternal uterus, adnexal regions, and pelvic cul-de-sac. Transvaginal technique was performed to assess early pregnancy. COMPARISON:  None. FINDINGS: Intrauterine gestational sac: Present Yolk sac:  Present Embryo:  Present Cardiac Activity: Present Heart Rate: 102 bpm CRL:  4.2 mm   6 w   1 d                  [place] [facility]: April 23, 2018 Subchorionic hemorrhage:  Minimal subchorionic  hemorrhage. Maternal uterus/adnexae: Normal appearance of the adnexae. No free fluid. IMPRESSION: 1. Single live intrauterine pregnancy, gestational age by ultrasound 6 weeks and 1 day. Minimal subchorionic hemorrhage. Electronically Signed   By: Elon Alas M.D.   On: 08/19/2017  22:43   MDM CBC, HCG, ABO/Rh Wet prep and gc/chlamydia US OB Transvaginal US OB Comp Less 14 weeks  O Neg blood type- Rhogam work up ordered  Consulted with Dr. Alesia Richards- ok to discharge patient home with reassurance and to keep appointment next week with [facility].   Assessment and Plan   1. Normal intrauterine pregnancy on prenatal ultrasound in first trimester   2. Vaginal bleeding affecting early pregnancy   3. Subchorionic hemorrhage of placenta in first trimester, single or unspecified fetus   4. [redacted] weeks gestation of pregnancy    -Discharge home in stable condition -Vaginal bleeding and pain precautions discussed -Patient advised to follow-up with [facility] as scheduled to start prenatal care -Patient may return to MAU as needed or if her condition were to change or worsen  [place] 08/19/2017, 9:28 PM

## 2017-08-20 LAB — ABO/RH: ABO/RH(D): O NEG

## 2017-08-20 LAB — RH IG WORKUP (INCLUDES ABO/RH)
ABO/RH(D): O NEG
Antibody Screen: NEGATIVE
Gestational Age(Wks): 5
Unit division: 0

## 2017-08-20 LAB — GC/CHLAMYDIA PROBE AMP (~~LOC~~) NOT AT ARMC
Chlamydia: NEGATIVE
Neisseria Gonorrhea: NEGATIVE

## 2017-08-27 LAB — OB RESULTS CONSOLE RPR: RPR: NONREACTIVE

## 2017-08-27 LAB — OB RESULTS CONSOLE ANTIBODY SCREEN: Antibody Screen: NEGATIVE

## 2017-08-27 LAB — OB RESULTS CONSOLE ABO/RH: RH Type: NEGATIVE

## 2017-08-27 LAB — OB RESULTS CONSOLE HEPATITIS B SURFACE ANTIGEN: Hepatitis B Surface Ag: NEGATIVE

## 2017-08-27 LAB — OB RESULTS CONSOLE RUBELLA ANTIBODY, IGM: Rubella: IMMUNE

## 2017-08-27 LAB — OB RESULTS CONSOLE HIV ANTIBODY (ROUTINE TESTING): HIV: NONREACTIVE

## 2017-09-21 ENCOUNTER — Other Ambulatory Visit: Payer: Self-pay

## 2017-09-21 ENCOUNTER — Encounter (HOSPITAL_COMMUNITY): Payer: Self-pay

## 2017-09-21 ENCOUNTER — Inpatient Hospital Stay (HOSPITAL_COMMUNITY)
Admission: AD | Admit: 2017-09-21 | Discharge: 2017-09-21 | Disposition: A | Discharge status: Home / Self Care | Payer: 59 | Source: Ambulatory Visit | Attending: Obstetrics & Gynecology | Admitting: Obstetrics & Gynecology

## 2017-09-21 DIAGNOSIS — Z79899 Other long term (current) drug therapy: Secondary | ICD-10-CM | POA: Diagnosis not present

## 2017-09-21 DIAGNOSIS — Z885 Allergy status to narcotic agent status: Secondary | ICD-10-CM | POA: Diagnosis not present

## 2017-09-21 DIAGNOSIS — O418X10 Other specified disorders of amniotic fluid and membranes, first trimester, not applicable or unspecified: Secondary | ICD-10-CM

## 2017-09-21 DIAGNOSIS — F419 Anxiety disorder, unspecified: Secondary | ICD-10-CM | POA: Insufficient documentation

## 2017-09-21 DIAGNOSIS — O209 Hemorrhage in early pregnancy, unspecified: Secondary | ICD-10-CM | POA: Diagnosis not present

## 2017-09-21 DIAGNOSIS — O4691 Antepartum hemorrhage, unspecified, first trimester: Secondary | ICD-10-CM | POA: Diagnosis present

## 2017-09-21 DIAGNOSIS — O468X1 Other antepartum hemorrhage, first trimester: Secondary | ICD-10-CM

## 2017-09-21 DIAGNOSIS — O99341 Other mental disorders complicating pregnancy, first trimester: Secondary | ICD-10-CM | POA: Diagnosis not present

## 2017-09-21 DIAGNOSIS — Z3A10 10 weeks gestation of pregnancy: Secondary | ICD-10-CM | POA: Insufficient documentation

## 2017-09-21 HISTORY — DX: Angina pectoris, unspecified: I20.9

## 2017-09-21 HISTORY — DX: Nausea: R11.0

## 2017-09-21 HISTORY — DX: Cardiac arrhythmia, unspecified: I49.9

## 2017-09-21 NOTE — MAU Provider Note (Signed)
History     CSN: [id number]  Arrival date and time: 09/21/17 9381   First Provider Initiated Contact with Patient 09/21/17 [phone]      Chief Complaint  Patient presents with  . Vaginal Bleeding   HPI Ms. Caitlyn Roy is a 32 y.o. [phone] at [redacted]w[redacted]d who presents to MAU today with complaint of vaginal bleeding since last night. The patient was seen in MAU for vaginal bleeding 4 weeks ago. She had a small [place] noted at that time and a 6 wk SIUP. She states that she had intercourse last night and bleeding started right after that. It was a little heavier this morning. She had cramping initially but minimal now. She denies abnormal discharge or fever.   OB History    Gravida  8   Para  1   Term  1   Preterm      AB  6   Living  1     SAB  4   TAB  1   Ectopic  1   Multiple      Live Births  1           Past Medical History:  Diagnosis Date  . Anemia   . Anginal pain ([place])    being followed by cardiologists  . Anxiety   . Dysrhythmia   . Nausea    d/t to "issues with gallballader"  . 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 Tubal pregnancy   . Vaginal odor   . Vaginitis     Past Surgical History:  Procedure Laterality Date  . BIOPSY EYE MUSCLE    . CESAREAN SECTION  2010  . LAPAROSCOPIC APPENDECTOMY N/A 10/20/2016   Procedure: APPENDECTOMY LAPAROSCOPIC;  Surgeon: Judeth Horn, MD;  Location: [place];  Service: General;  Laterality: N/A;  . laporoscopy      Family History  Problem Relation Age of Onset  . Hypertension Mother   . Breast cancer Mother   . Heart attack Mother   . Alcohol abuse Father     Social History   Tobacco Use  . Smoking status: Never Smoker  . Smokeless tobacco: Never Used  Substance Use Topics  . Alcohol use: No    Frequency: Never  . Drug use: No    Allergies:  Allergies  Allergen Reactions  . Oxycodone Shortness Of Breath and Itching    Medications  Prior to Admission  Medication Sig Dispense Refill Last Dose  . Prenatal Vit-Fe Fumarate-FA (MULTIVITAMIN-PRENATAL) 27-0.8 MG TABS tablet Take 1 tablet by mouth daily at 12 noon.   08/19/2017 at Unknown time    Review of Systems  Constitutional: Negative for fever.  Gastrointestinal: Negative for abdominal pain.  Genitourinary: Positive for pelvic pain and vaginal bleeding. Negative for vaginal discharge.   Physical Exam   Blood pressure 125/76, pulse 98, temperature 99.6 F (37.6 C), temperature source Oral, resp. rate 18, height 5\' 2"  (1.575 m), weight 167 lb 4 oz (75.9 kg), last menstrual period 07/12/2017, SpO2 100 %.  Physical Exam  Nursing note and vitals reviewed. Constitutional: She is oriented to person, place, and time. She appears well-developed and well-nourished. No distress.  HENT:  Head: Normocephalic and atraumatic.  Cardiovascular: Normal rate.  Respiratory: Effort normal.  GI: Soft. She exhibits no distension. There is no tenderness.  Genitourinary: Uterus is enlarged. Uterus is not tender. Cervix exhibits no motion tenderness, no discharge and no friability. Right adnexum  displays no mass and no tenderness. Left adnexum displays no mass and no tenderness. There is bleeding (scant) in the vagina. No vaginal discharge found.  Neurological: She is alert and oriented to person, place, and time.  Skin: Skin is warm and dry. No erythema.  Psychiatric: She has a normal mood and affect.    MAU Course  Procedures Pt informed that the ultrasound is considered a limited OB ultrasound and is not intended to be a complete ultrasound exam.  Patient also informed that the ultrasound is not being completed with the intent of assessing for fetal or placental anomalies or any pelvic abnormalities.  Explained that the purpose of today's ultrasound is to assess for  viability.  Patient acknowledges the purpose of the exam and the limitations of the study. +FM noted. +FHR visualized.    MDM FHR - 181 with doppler, also confirmed with [place] at bedside.  Discussed patient with Dr. Simona Huh. Agrees with plan of care. Patient ok for discharge. Follow-up in the office as scheduled and discuss pelvic rest.   Assessment and Plan  A: SIUP at [redacted]w[redacted]d Small subchorionic hemorrhage Post coital bleeding in pregnancy  P: Discharge home Bleeding precautions and pelvic rest discussed Patient advised to follow-up with [facility] as scheduled for routine prenatal care or sooner if symptoms worsen  Patient may return to MAU as needed or if her condition were to change or worsen  Kerry Hough, PA-C 09/21/2017, 10:08 AM

## 2017-09-21 NOTE — Discharge Instructions (Signed)
Pelvic Rest °Pelvic rest may be recommended if: °· Your placenta is partially or completely covering the opening of your cervix (placenta previa). °· There is bleeding between the wall of the uterus and the amniotic sac in the first trimester of pregnancy (subchorionic hemorrhage). °· You went into labor too early (preterm labor). ° °Based on your overall health and the health of your baby, your health care provider will decide if pelvic rest is right for you. °How do I rest my pelvis? °For as long as told by your health care provider: °· Do not have sex, sexual stimulation, or an orgasm. °· Do not use tampons. Do not douche. Do not put anything in your vagina. °· Do not lift anything that is heavier than 10 lb (4.5 kg). °· Avoid activities that take a lot of effort (are strenuous). °· Avoid any activity in which your pelvic muscles could become strained. ° °When should I seek medical care? °Seek medical care if you have: °· Cramping pain in your lower abdomen. °· Vaginal discharge. °· A low, dull backache. °· Regular contractions. °· Uterine tightening. ° °When should I seek immediate medical care? °Seek immediate medical care if: °· You have vaginal bleeding and you are pregnant. ° °This information is not intended to replace advice given to you by your health care provider. Make sure you discuss any questions you have with your health care provider. °Document Released: 08/19/2010 Document Revised: 09/30/2015 Document Reviewed: 10/26/2014 °Elsevier Interactive Patient Education © 2018 [employer] Inc[employer] ° °Subchorionic Hematoma °A subchorionic hematoma is a gathering of blood between the outer wall of the placenta and the inner wall of the womb (uterus). The placenta is the organ that connects the fetus to the wall of the uterus. The placenta performs the feeding, breathing (oxygen to the fetus), and waste removal (excretory work) of the fetus. °Subchorionic hematoma is the most common abnormality found on a result  from ultrasonography done during the first trimester or early second trimester of pregnancy. If there has been little or no vaginal bleeding, early small hematomas usually shrink on their own and do not affect your baby or pregnancy. The blood is gradually absorbed over 1-2 weeks. When bleeding starts later in pregnancy or the hematoma is larger or occurs in an older pregnant woman, the outcome may not be as good. Larger hematomas may get bigger, which increases the chances for miscarriage. Subchorionic hematoma also increases the risk of premature detachment of the placenta from the uterus, preterm (premature) labor, and stillbirth. °Follow these instructions at home: °· Stay on bed rest if your health care provider recommends this. Although bed rest will not prevent more bleeding or prevent a miscarriage, your health care provider may recommend bed rest until you are advised otherwise. °· Avoid heavy lifting (more than 10 lb [4.5 kg]), exercise, sexual intercourse, or douching as directed by your health care provider. °· Keep track of the number of pads you use each day and how soaked (saturated) they are. Write down this information. °· Do not use tampons. °· Keep all follow-up appointments as directed by your health care provider. Your health care provider may ask you to have follow-up blood tests or ultrasound tests or both. °Get help right away if: °· You have severe cramps in your stomach, back, abdomen, or pelvis. °· You have a fever. °· You pass large clots or tissue. Save any tissue for your health care provider to look at. °· Your bleeding increases or you become lightheaded,   feel weak, or have fainting episodes. °This information is not intended to replace advice given to you by your health care provider. Make sure you discuss any questions you have with your health care provider. °Document Released: 08/09/2006 Document Revised: 09/30/2015 Document Reviewed: 11/21/2012 °Elsevier Interactive Patient  Education © 2017 Elsevier Inc. ° °

## 2017-09-21 NOTE — MAU Note (Signed)
Pt states she had intercourse last pm & this morning noted a gush of bright red bleeding.  Since then bleeding has turned darker, she is not saturating a pad in an hr.  States she also began feeling cramping; rates as 4 on 0-10 pain scale.  Pt states that the vag bleeding has happened after intercourse twice before.

## 2017-11-15 ENCOUNTER — Ambulatory Visit: Payer: 59 | Admitting: Internal Medicine

## 2017-12-03 ENCOUNTER — Telehealth: Payer: Self-pay | Admitting: Diagnostic Neuroimaging

## 2017-12-03 NOTE — Telephone Encounter (Signed)
Pt has seen Dr. Leta Baptist in the past, but is requesting to switch providers and see Dr. Jaynee Eagles. Would you both be ok with this?

## 2017-12-03 NOTE — Telephone Encounter (Signed)
  Caitlyn Roy would you call patient and see what she is wanting to be seen for? Then we can make a determination where she would best be cared for but this appears to be a complicated case. Does she have a new referral for a new problem?  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 Earlier this year she was seen in the ED for repeated eye problems due to orbital pseudotumor.  It appears she may also be pregnant at this time. I think patient would receive the most comprehensive and best care at [facility], she has followed with ophthalmology and Neurology there this year. She has also been evaluated for headaches at [facility] and should continue to follow there for headaches.)

## 2017-12-03 NOTE — Telephone Encounter (Signed)
I had returned patient to PCP and [facility]. Ok to switch from my standpoint. May be helpful to find out the reason for follow up. -VRP

## 2017-12-04 NOTE — Telephone Encounter (Signed)
I think patient would be best served at Caitlyn Roy, Dr. Leta Baptist is in agreement. Claris Pong will you call and let her know this? Thank you

## 2017-12-04 NOTE — Telephone Encounter (Signed)
Spoke with patient. She reported this request is due to the same problem she has had all along but seems to be worse as she gets older---twitching and jumping of nerves. She reported she has twitching in her eyes, neck, feet, back, fingers, legs. She said her eye twitched for a week and that is the longest it has ever lasted. She feels like her pseudotumor was focused on more than the twitching and that is bothering her more. She also wanted to make providers aware that she is pregnant.

## 2017-12-04 NOTE — Telephone Encounter (Signed)
We haven't received a new referral for pt-she was just wanting to f/u on eye issues

## 2017-12-05 NOTE — Telephone Encounter (Signed)
Yes. I called and let pt know your recommendation. She understood.

## 2018-01-16 ENCOUNTER — Encounter (HOSPITAL_COMMUNITY): Payer: Self-pay | Admitting: *Deleted

## 2018-01-16 ENCOUNTER — Other Ambulatory Visit: Payer: Self-pay

## 2018-01-16 ENCOUNTER — Inpatient Hospital Stay (HOSPITAL_COMMUNITY)
Admission: AD | Admit: 2018-01-16 | Discharge: 2018-01-16 | Disposition: A | Discharge status: Home / Self Care | Payer: 59 | Source: Ambulatory Visit | Attending: Obstetrics and Gynecology | Admitting: Obstetrics and Gynecology

## 2018-01-16 DIAGNOSIS — R109 Unspecified abdominal pain: Secondary | ICD-10-CM | POA: Diagnosis not present

## 2018-01-16 DIAGNOSIS — Z3A26 26 weeks gestation of pregnancy: Secondary | ICD-10-CM | POA: Diagnosis not present

## 2018-01-16 DIAGNOSIS — R1033 Periumbilical pain: Secondary | ICD-10-CM | POA: Insufficient documentation

## 2018-01-16 DIAGNOSIS — O26892 Other specified pregnancy related conditions, second trimester: Secondary | ICD-10-CM | POA: Diagnosis not present

## 2018-01-16 DIAGNOSIS — Z885 Allergy status to narcotic agent status: Secondary | ICD-10-CM | POA: Insufficient documentation

## 2018-01-16 DIAGNOSIS — F419 Anxiety disorder, unspecified: Secondary | ICD-10-CM | POA: Insufficient documentation

## 2018-01-16 DIAGNOSIS — O99342 Other mental disorders complicating pregnancy, second trimester: Secondary | ICD-10-CM | POA: Insufficient documentation

## 2018-01-16 LAB — WET PREP, GENITAL
Clue Cells Wet Prep HPF POC: NONE SEEN
Sperm: NONE SEEN
Trich, Wet Prep: NONE SEEN
Yeast Wet Prep HPF POC: NONE SEEN

## 2018-01-16 LAB — URINALYSIS, ROUTINE W REFLEX MICROSCOPIC
Bilirubin Urine: NEGATIVE
Glucose, UA: NEGATIVE mg/dL
Hgb urine dipstick: NEGATIVE
Ketones, ur: NEGATIVE mg/dL
Leukocytes, UA: NEGATIVE
Nitrite: NEGATIVE
Protein, ur: NEGATIVE mg/dL
Specific Gravity, Urine: 1.02 (ref 1.005–1.030)
pH: 6.5 (ref 5.0–8.0)

## 2018-01-16 NOTE — MAU Provider Note (Signed)
Chief Complaint:  Abdominal Pain   First Provider Initiated Contact with Patient 01/16/18 1520      HPI: Caitlyn Roy is a 32 y.o. [id number] at [redacted]w[redacted]d who presents to maternity admissions reporting pain in her mid abdomen, at her umbilicus. The pain started after intercourse today and has not stopped. It is sharp pain just behind her umbilicus.  She has no other symptoms.  She has not tried any treatments.  She reports good fetal movement, denies LOF, vaginal bleeding, vaginal itching/burning, urinary symptoms, h/a, dizziness, n/v, or fever/chills.    HPI  Past Medical History: Past Medical History:  Diagnosis Date  . Anemia   . Anginal pain ([place])    being followed by cardiologists  . Anxiety   . Dysrhythmia   . Nausea    d/t to "issues with gallballader"  . 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 Tubal pregnancy   . Vaginal odor   . Vaginitis     Past obstetric history: OB History  Gravida Para Term Preterm AB Living  8 1 1   6 1   SAB TAB Ectopic Multiple Live Births  4 1 1   1     # Outcome Date GA Lbr Len/2nd Weight Sex Delivery Anes PTL Lv  8 Current           7 Ectopic 2014          6 Term 2010    M CS-Unspec   LIV  5 TAB           4 SAB           3 SAB           2 SAB           1 SAB             Past Surgical History: Past Surgical History:  Procedure Laterality Date  . BIOPSY EYE MUSCLE    . CESAREAN SECTION  2010  . LAPAROSCOPIC APPENDECTOMY N/A 10/20/2016   Procedure: APPENDECTOMY LAPAROSCOPIC;  Surgeon: Judeth Horn, MD;  Location: [place];  Service: General;  Laterality: N/A;  . laporoscopy      Family History: Family History  Problem Relation Age of Onset  . Hypertension Mother   . Breast cancer Mother   . Heart attack Mother   . Alcohol abuse Father     Social History: Social History   Tobacco Use  . Smoking status: Never Smoker  . Smokeless tobacco: Never Used  Substance Use  Topics  . Alcohol use: No    Frequency: Never  . Drug use: No    Allergies:  Allergies  Allergen Reactions  . Oxycodone Shortness Of Breath and Itching    Meds:  No medications prior to admission.    ROS:  Review of Systems  Constitutional: Negative for chills, fatigue and fever.  Respiratory: Negative for shortness of breath.   Cardiovascular: Negative for chest pain.  Gastrointestinal: Positive for abdominal pain.  Genitourinary: Negative for difficulty urinating, dysuria, flank pain, pelvic pain, vaginal bleeding, vaginal discharge and vaginal pain.  Musculoskeletal: Negative for back pain.  Neurological: Negative for dizziness and headaches.  Psychiatric/Behavioral: Negative.      I have reviewed patient's Past Medical Hx, Surgical Hx, Family Hx, Social Hx, medications and allergies.   Physical Exam   Patient Vitals for the past 24 hrs:  BP Temp Temp src Pulse Resp SpO2 Weight  01/16/18  1623 (!) 110/52 98.4 F (36.9 C) Oral (!) 107 18 - -  01/16/18 1441 124/70 99.2 F (37.3 C) Oral (!) 122 18 99 % 79 kg   Constitutional: Well-developed, well-nourished female in no acute distress.  Cardiovascular: normal rate Respiratory: normal effort GI: Abd soft, non-tender, gravid appropriate for gestational age.  MS: Extremities nontender, no edema, normal ROM Neurologic: Alert and oriented x 4.  GU: Neg CVAT.  PELVIC EXAM: Cervix pink, visually closed, without lesion, scant white creamy discharge, vaginal walls and external genitalia normal Bimanual exam: Cervix 0/long/high, firm, anterior, neg CMT, uterus nontender, nonenlarged, adnexa without tenderness, enlargement, or mass  Dilation: Closed Effacement (%): Thick Cervical Position: Posterior Exam by:: L. Leftwich-Kirby, CNM  FHT:  Baseline 150 , moderate variability, accelerations present, no decelerations Contractions: None on toco or to palpation   Labs: Results for orders placed or performed during the  hospital encounter of 01/16/18 (from the past 24 hour(s))  Urinalysis, Routine w reflex microscopic     Status: None   Collection Time: 01/16/18  3:00 PM  Result Value Ref Range   Color, Urine YELLOW YELLOW   APPearance CLEAR CLEAR   Specific Gravity, Urine 1.020 1.005 - 1.030   pH 6.5 5.0 - 8.0   Glucose, UA NEGATIVE NEGATIVE mg/dL   Hgb urine dipstick NEGATIVE NEGATIVE   Bilirubin Urine NEGATIVE NEGATIVE   Ketones, ur NEGATIVE NEGATIVE mg/dL   Protein, ur NEGATIVE NEGATIVE mg/dL   Nitrite NEGATIVE NEGATIVE   Leukocytes, UA NEGATIVE NEGATIVE  Wet prep, genital     Status: Abnormal   Collection Time: 01/16/18  3:24 PM  Result Value Ref Range   Yeast Wet Prep HPF POC NONE SEEN NONE SEEN   Trich, Wet Prep NONE SEEN NONE SEEN   Clue Cells Wet Prep HPF POC NONE SEEN NONE SEEN   WBC, Wet Prep HPF POC MANY (A) NONE SEEN   Sperm NONE SEEN    --/--/O NEG Performed at [facility], [address], [place], [place] [address] , O NEG (04/14 2130)  Imaging:  No results found.  MAU Course/MDM: I have ordered labs and reviewed results.  NST reviewed and reactive Pt with pain 2-3 cm above umbilicus, soft to palpation, some evidence of abdominal hernia on exam, mild without incarceration Pt also has hx of fibroids so could be source of pain FHR tracing wnl, no evidence of preterm labor today Consult Dr Simona Huh with presentation, exam findings and test results.  Pt to f/u with ultrasound tomorrow in office prior to her OB appt Pt discharge with strict return precautions.  Assessment: 1. Periumbilical abdominal pain   2. Abdominal pain during pregnancy in second trimester     Plan: Discharge home Labor precautions and fetal kick counts Follow-up Information    [name], MD Follow up.   Specialty:  Obstetrics and Gynecology Why:  Tomorrow as scheduled. Also, you have a new ultrasound appointment at Dr Andy Gauss office at 1:30 pm tomorrow, 01/17/18 for follow up.  Return to  MAU as needed for emergencies. Contact information: [address] [place] [address] [phone]          Allergies as of 01/16/2018      Reactions   Oxycodone Shortness Of Breath, Itching      Medication List    TAKE these medications   multivitamin-prenatal 27-0.8 MG Tabs tablet Take 1 tablet by mouth daily at 12 noon.       Fatima Blank Certified Nurse-Midwife  01/16/2018 8:07 PM

## 2018-01-16 NOTE — MAU Note (Signed)
2 nights ago, during sex, started having pain in the middle of her stomach.   Has happened before, usually goes away- but it didn't this time.  It is a horrible pain, hurts to walk.

## 2018-01-16 NOTE — Progress Notes (Signed)
Genital cultures obtained by L. Leftwich-Kirby, CNM

## 2018-01-17 LAB — GC/CHLAMYDIA PROBE AMP (~~LOC~~) NOT AT ARMC
Chlamydia: NEGATIVE
Neisseria Gonorrhea: NEGATIVE

## 2018-01-29 ENCOUNTER — Emergency Department (HOSPITAL_COMMUNITY)
Admission: EM | Admit: 2018-01-29 | Discharge: 2018-01-29 | Disposition: A | Discharge status: Home / Self Care | Payer: 59 | Attending: Emergency Medicine | Admitting: Emergency Medicine

## 2018-01-29 ENCOUNTER — Encounter (HOSPITAL_COMMUNITY): Payer: Self-pay | Admitting: Emergency Medicine

## 2018-01-29 DIAGNOSIS — R002 Palpitations: Secondary | ICD-10-CM | POA: Diagnosis not present

## 2018-01-29 DIAGNOSIS — Z5321 Procedure and treatment not carried out due to patient leaving prior to being seen by health care provider: Secondary | ICD-10-CM | POA: Diagnosis not present

## 2018-01-29 LAB — BASIC METABOLIC PANEL
Anion gap: 8 (ref 5–15)
BUN: 5 mg/dL — ABNORMAL LOW (ref 6–20)
CO2: 24 mmol/L (ref 22–32)
Calcium: 9.2 mg/dL (ref 8.9–10.3)
Chloride: 104 mmol/L (ref 98–111)
Creatinine, Ser: 0.6 mg/dL (ref 0.44–1.00)
GFR calc Af Amer: >60 mL/min (ref >60)
GFR calc non Af Amer: >60 mL/min (ref >60)
Glucose, Bld: 117 mg/dL — ABNORMAL HIGH (ref 70–99)
Potassium: 3.6 mmol/L (ref 3.5–5.1)
Sodium: 136 mmol/L (ref 135–145)

## 2018-01-29 LAB — CBC
HCT: 29.1 % — ABNORMAL LOW (ref 36.0–46.0)
Hemoglobin: 9 g/dL — ABNORMAL LOW (ref 12.0–15.0)
MCH: 28.2 pg (ref 26.0–34.0)
MCHC: 30.9 g/dL (ref 30.0–36.0)
MCV: 91.2 fL (ref 78.0–100.0)
Platelets: 293 10*3/uL (ref 150–400)
RBC: 3.19 MIL/uL — ABNORMAL LOW (ref 3.87–5.11)
RDW: 13.8 % (ref 11.5–15.5)
WBC: 8.5 10*3/uL (ref 4.0–10.5)

## 2018-01-29 LAB — I-STAT TROPONIN, ED: Troponin i, poc: 0 ng/mL (ref 0.00–0.08)

## 2018-01-29 NOTE — ED Notes (Signed)
Pt passes by and says "I'm leaving." I ask pt who she is and pt reports name. Does not stop. Seen walking out lobby entrance to parking lot.

## 2018-01-29 NOTE — ED Triage Notes (Signed)
Pt arrives stating she is having palpitations with any activity along with just feeling weak. Pt reports being [redacted] weeks pregnant.

## 2018-01-29 NOTE — ED Notes (Signed)
Rapid OB aware of pt here states no need for monitoring at this time. NO abd pain or OB complaints

## 2018-02-24 ENCOUNTER — Encounter (HOSPITAL_COMMUNITY): Payer: Self-pay | Admitting: Emergency Medicine

## 2018-02-24 ENCOUNTER — Inpatient Hospital Stay (HOSPITAL_COMMUNITY)
Admission: AD | Admit: 2018-02-24 | Discharge: 2018-02-25 | Disposition: A | Discharge status: Home / Self Care | Payer: 59 | Source: Ambulatory Visit | Attending: Obstetrics and Gynecology | Admitting: Obstetrics and Gynecology

## 2018-02-24 DIAGNOSIS — Z3689 Encounter for other specified antenatal screening: Secondary | ICD-10-CM

## 2018-02-24 DIAGNOSIS — Z3A32 32 weeks gestation of pregnancy: Secondary | ICD-10-CM | POA: Insufficient documentation

## 2018-02-24 DIAGNOSIS — O26893 Other specified pregnancy related conditions, third trimester: Secondary | ICD-10-CM | POA: Diagnosis not present

## 2018-02-24 DIAGNOSIS — R42 Dizziness and giddiness: Secondary | ICD-10-CM | POA: Diagnosis present

## 2018-02-24 DIAGNOSIS — R0789 Other chest pain: Secondary | ICD-10-CM | POA: Diagnosis present

## 2018-02-24 DIAGNOSIS — K219 Gastro-esophageal reflux disease without esophagitis: Secondary | ICD-10-CM | POA: Diagnosis not present

## 2018-02-24 DIAGNOSIS — D649 Anemia, unspecified: Secondary | ICD-10-CM | POA: Diagnosis not present

## 2018-02-24 DIAGNOSIS — O99013 Anemia complicating pregnancy, third trimester: Secondary | ICD-10-CM

## 2018-02-24 DIAGNOSIS — R109 Unspecified abdominal pain: Secondary | ICD-10-CM | POA: Diagnosis not present

## 2018-02-24 DIAGNOSIS — O99613 Diseases of the digestive system complicating pregnancy, third trimester: Secondary | ICD-10-CM | POA: Diagnosis not present

## 2018-02-24 MED ORDER — GI COCKTAIL ~~LOC~~
30.0000 mL | Freq: Once | ORAL | Status: AC
  Administered 2018-02-24: 30 mL via ORAL
  Filled 2018-02-24: qty 30

## 2018-02-24 NOTE — MAU Note (Signed)
Pt. Having dizziness, lightheaded, chest pain, heart palpitations, lower abdominal pressure. Denies LOF or bleeding. +FM

## 2018-02-24 NOTE — MAU Provider Note (Signed)
History     CSN: [id number]  Arrival date and time: 02/24/18 2215   First Provider Initiated Contact with Patient 02/24/18 2322     [id number] @32 .3 wks here with dizziness and chest pressure. Dizziness has been ongoing for "a while". No syncope. Chest pressure started today. "Feeling my heart pounding". Located in upper chest. Reports worsening heartburn. Uses TUMS but doesn't help. No radiating jaw or arm pain. Having SOB with exertion. No recent cough, cold, illness. She has known IDA and admits to not taking her Fe supplements, scheduled for Feraheme infusion this week. Denies VB and LOF. Having some BH ctx. Reports good FM.    OB History    Gravida  8   Para  1   Term  1   Preterm      AB  6   Living  1     SAB  4   TAB  1   Ectopic  1   Multiple      Live Births  1           Past Medical History:  Diagnosis Date  . Anemia   . Anginal pain ([place])    being followed by cardiologists  . Anxiety   . Dysrhythmia   . Nausea    d/t to "issues with gallballader"  . 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 Tubal pregnancy   . Vaginal odor   . Vaginitis     Past Surgical History:  Procedure Laterality Date  . BIOPSY EYE MUSCLE    . CESAREAN SECTION  2010  . LAPAROSCOPIC APPENDECTOMY N/A 10/20/2016   Procedure: APPENDECTOMY LAPAROSCOPIC;  Surgeon: Judeth Horn, MD;  Location: [facility];  Service: General;  Laterality: N/A;  . laporoscopy      Family History  Problem Relation Age of Onset  . Hypertension Mother   . Breast cancer Mother   . Heart attack Mother   . Alcohol abuse Father     Social History   Tobacco Use  . Smoking status: Never Smoker  . Smokeless tobacco: Never Used  Substance Use Topics  . Alcohol use: No    Frequency: Never  . Drug use: No    Allergies:  Allergies  Allergen Reactions  . Oxycodone Shortness Of Breath and Itching    Medications Prior to Admission   Medication Sig Dispense Refill Last Dose  . Fe Fum-FePoly-Vit C-Vit B3 (INTEGRA) 62.5-62.5-40-3 MG CAPS Take 1 capsule by mouth daily.  2 01/29/2018 at Unknown time  . Prenat-FeFum-FePo-FA-Omega 3 (CONCEPT DHA) 53.5-38-1 MG CAPS Take 1 capsule by mouth daily.  4 01/28/2018 at Unknown time    Review of Systems  Respiratory: Positive for shortness of breath. Negative for cough.   Gastrointestinal: Negative for abdominal pain.  Genitourinary: Negative for vaginal bleeding.  Neurological: Positive for dizziness. Negative for syncope.   Physical Exam   Blood pressure 117/72, pulse (!) 146, temperature 98.6 F (37 C), resp. rate 18, height 5\' 2"  (1.575 m), weight 82.1 kg, last menstrual period 07/12/2017, SpO2 98 %. No data found. HR mostly 115-120s (higher when standing)  Physical Exam  Constitutional: She is oriented to person, place, and time. She appears well-developed and well-nourished. No distress.  HENT:  Head: Normocephalic and atraumatic.  Neck: Normal range of motion.  Cardiovascular: Regular rhythm and normal heart sounds.  tachy  Respiratory: Effort normal. No respiratory distress. She has no wheezes. She has no rales.  Musculoskeletal: Normal range of motion. She exhibits no edema.  Neurological: She is alert and oriented to person, place, and time.  Skin: Skin is warm and dry. No pallor.  Psychiatric: She has a normal mood and affect.  EFM: 135 bpm, mod variability, + accels, no decels Toco: none  Results for orders placed or performed during the hospital encounter of 02/24/18 (from the past 24 hour(s))  CBC     Status: Abnormal   Collection Time: 02/24/18 11:46 PM  Result Value Ref Range   WBC 10.5 4.0 - 10.5 K/uL   RBC 3.25 (L) 3.87 - 5.11 MIL/uL   Hemoglobin 9.2 (L) 12.0 - 15.0 g/dL   HCT 27.8 (L) 36.0 - 46.0 %   MCV 85.5 80.0 - 100.0 fL   MCH 28.3 26.0 - 34.0 pg   MCHC 33.1 30.0 - 36.0 g/dL   RDW 13.6 11.5 - 15.5 %   Platelets 295 150 - 400 K/uL   nRBC 0.0  0.0 - 0.2 %   MAU Course  Procedures GI cocktail Feraheme  MDM Review of record> had cards workup last year that was negative Labs and EKG ordered EKG interpreted by Dr. Lennie Muckle sinus tach, no ischemia  Chest pressure and heartburn resolved after GI cocktail No evidence of acute CV process Sx likely d/t anemia Stressed importance of resuming oral Fe- take with OJ Stable for discharge home  Assessment and Plan   1. [redacted] weeks gestation of pregnancy   2. NST (non-stress test) reactive   3. Anemia during pregnancy in third trimester   4. Gastroesophageal reflux disease without esophagitis    Discharge home Follow up at [facility] this week as scheduled Rx Pepcid Return precautions  Allergies as of 02/25/2018      Reactions   Oxycodone Shortness Of Breath, Itching      Medication List    TAKE these medications   CONCEPT DHA 53.5-38-1 MG Caps Take 1 capsule by mouth daily.   famotidine 20 MG tablet Commonly known as:  PEPCID Take 1 tablet (20 mg total) by mouth daily.   INTEGRA 62.5-62.5-40-3 MG Caps Take 1 capsule by mouth daily.      Julianne Handler, CNM 02/25/2018, 1:09 AM

## 2018-02-25 DIAGNOSIS — O26893 Other specified pregnancy related conditions, third trimester: Secondary | ICD-10-CM

## 2018-02-25 DIAGNOSIS — D649 Anemia, unspecified: Secondary | ICD-10-CM

## 2018-02-25 DIAGNOSIS — O99013 Anemia complicating pregnancy, third trimester: Secondary | ICD-10-CM

## 2018-02-25 DIAGNOSIS — Z3A32 32 weeks gestation of pregnancy: Secondary | ICD-10-CM

## 2018-02-25 DIAGNOSIS — R109 Unspecified abdominal pain: Secondary | ICD-10-CM

## 2018-02-25 LAB — URINALYSIS, ROUTINE W REFLEX MICROSCOPIC
Bilirubin Urine: NEGATIVE
Glucose, UA: NEGATIVE mg/dL
Ketones, ur: NEGATIVE mg/dL
Leukocytes, UA: NEGATIVE
Nitrite: NEGATIVE
Protein, ur: 100 mg/dL — AB
Specific Gravity, Urine: 1.026 (ref 1.005–1.030)
pH: 5 (ref 5.0–8.0)

## 2018-02-25 LAB — CBC
HCT: 27.8 % — ABNORMAL LOW (ref 36.0–46.0)
Hemoglobin: 9.2 g/dL — ABNORMAL LOW (ref 12.0–15.0)
MCH: 28.3 pg (ref 26.0–34.0)
MCHC: 33.1 g/dL (ref 30.0–36.0)
MCV: 85.5 fL (ref 80.0–100.0)
Platelets: 295 10*3/uL (ref 150–400)
RBC: 3.25 MIL/uL — ABNORMAL LOW (ref 3.87–5.11)
RDW: 13.6 % (ref 11.5–15.5)
WBC: 10.5 10*3/uL (ref 4.0–10.5)
nRBC: 0 % (ref 0.0–0.2)

## 2018-02-25 MED ORDER — FAMOTIDINE 20 MG PO TABS: 20.0000 mg | ORAL_TABLET | Freq: Every day | ORAL | 1 refills | Status: DC

## 2018-02-25 MED ORDER — SODIUM CHLORIDE 0.9 % IV SOLN
510.0000 mg | Freq: Once | INTRAVENOUS | Status: AC
  Administered 2018-02-25: 510 mg via INTRAVENOUS
  Filled 2018-02-25: qty 17

## 2018-02-25 NOTE — Discharge Instructions (Signed)
Pregnancy and Anemia Anemia is a condition in which the concentration of red blood cells or hemoglobin in the blood is below normal. Hemoglobin is a substance in red blood cells that carries oxygen to the tissues of the body. Anemia results in not enough oxygen reaching these tissues. Anemia during pregnancy is common because the fetus uses more iron and folic acid as it is developing. Your body may not produce enough red blood cells because of this. Also, during pregnancy, the liquid part of the blood (plasma) increases by about 50%, and the red blood cells increase by only 25%. This lowers the concentration of the red blood cells and creates a natural anemia-like situation. What are the causes? The most common cause of anemia during pregnancy is not having enough iron in the body to make red blood cells (iron deficiency anemia). Other causes may include:  Folic acid deficiency.  Vitamin B12 deficiency.  Certain prescription or over-the-counter medicines.  Certain medical conditions or infections that destroy red blood cells.  A low platelet count and bleeding caused by antibodies that go through the placenta to the fetus from the mothers blood.  What are the signs or symptoms? Mild anemia may not be noticeable. If it becomes severe, symptoms may include:  Tiredness.  Shortness of breath, especially with exercise.  Weakness.  Fainting.  Pale looking skin.  Headaches.  Feeling a fast or irregular heartbeat (palpitations).  How is this diagnosed? The type of anemia is usually diagnosed from your family and medical history and blood tests. How is this treated? Treatment of anemia during pregnancy depends on the cause of the anemia. Treatment can include:  Supplements of iron, vitamin F74, or folic acid.  A blood transfusion. This may be needed if blood loss is severe.  Hospitalization. This may be needed if there is significant continual blood loss.  Dietary  changes.  Follow these instructions at home:  Follow your dietitian's or health care provider's dietary recommendations.  Increase your vitamin C intake. This will help the stomach absorb more iron.  Eat a diet rich in iron. This would include foods such as: ? Liver. ? Beef. ? Whole grain bread. ? Eggs. ? Dried fruit.  Take iron and vitamins as directed by your health care provider.  Eat green leafy vegetables. These are a good source of folic acid. Contact a health care provider if:  You have frequent or lasting headaches.  You are looking pale.  You are bruising easily. Get help right away if:  You have extreme weakness, shortness of breath, or chest pain.  You become dizzy or have trouble concentrating.  You have heavy vaginal bleeding.  You develop a rash.  You have bloody or black, tarry stools.  You faint.  You vomit up blood.  You vomit repeatedly.  You have abdominal pain.  You have a fever or persistent symptoms for more than 2-3 days.  You have a fever and your symptoms suddenly get worse.  You are dehydrated. This information is not intended to replace advice given to you by your health care provider. Make sure you discuss any questions you have with your health care provider. Document Released: 04/21/2000 Document Revised: 09/30/2015 Document Reviewed: 12/04/2012 Elsevier Interactive Patient Education  2017 [employer].   Heartburn During Pregnancy Heartburn is pain or discomfort in the throat or chest. It may cause a burning feeling. It happens when stomach acid moves up into the tube that carries food from your mouth to your stomach (  esophagus). Heartburn is common during pregnancy. It usually goes away or gets better after giving birth. Follow these instructions at home: Eating and drinking  Do not drink alcohol while you are pregnant.  Figure out which foods and beverages make you feel worse, and avoid them.  Beverages that you may  want to avoid include: ? Coffee and tea (with or without caffeine). ? Energy drinks and sports drinks. ? Bubbly (carbonated) drinks or sodas. ? Citrus fruit juices.  Foods that you may want to avoid include: ? Chocolate and cocoa. ? Peppermint and mint flavorings. ? Garlic, onions, and horseradish. ? Spicy and acidic foods. These include peppers, chili powder, curry powder, vinegar, hot sauces, and barbecue sauce. ? Citrus fruits, such as oranges, lemons, and limes. ? Tomato-based foods, such as red sauce, chili, and salsa. ? Fried and fatty foods, such as donuts, french fries, potato chips, and high-fat dressings. ? High-fat meats, such as hot dogs, cold cuts, sausage, ham, and bacon. ? High-fat dairy items, such as whole milk, butter, and cheese.  Eat small meals often, instead of large meals.  Avoid drinking a lot of liquid with your meals.  Avoid eating meals during the 2-3 hours before you go to bed.  Avoid lying down right after you eat.  Do not exercise right after you eat. Medicines  Take over-the-counter and prescription medicines only as told by your doctor.  Do not take aspirin, ibuprofen, or other NSAIDs unless your doctor tells you to do that.  Your doctor may tell you to avoid medicines that have sodium bicarbonate in them. General instructions  If told, raise the head of your bed about 6 inches (15 cm). You can do this by putting blocks under the legs. Sleeping with more pillows does not help with heartburn.  Do not use any products that contain nicotine or tobacco, such as cigarettes and e-cigarettes. If you need help quitting, ask your doctor.  Wear loose-fitting clothing.  Try to lower your stress, such as with yoga or meditation. If you need help, ask your doctor.  Stay at a healthy weight. If you are overweight, work with your doctor to safely lose weight.  Keep all follow-up visits as told by your doctor. This is important. Contact a doctor  if:  You get new symptoms.  Your symptoms do not get better with treatment.  You have weight loss and you do not know why.  You have trouble swallowing.  You make loud sounds when you breathe (wheeze).  You have a cough that does not go away.  You have heartburn often for more than 2 weeks.  You feel sick to your stomach (nauseous), and this does not get better with treatment.  You are throwing up (vomiting), and this does not get better with treatment.  You have pain in your belly (abdomen). Get help right away if:  You have very bad chest pain that spreads to your arm, neck, or jaw.  You feel sweaty, dizzy, or light-headed.  You have trouble breathing.  You have pain when swallowing.  You throw up and your throw-up looks like blood or coffee grounds.  Your poop (stool) is bloody or black. This information is not intended to replace advice given to you by your health care provider. Make sure you discuss any questions you have with your health care provider. Document Released: 05/27/2010 Document Revised: 01/10/2016 Document Reviewed: 01/10/2016 Elsevier Interactive Patient Education  2017 [employer].

## 2018-03-20 ENCOUNTER — Encounter (HOSPITAL_COMMUNITY): Payer: Self-pay

## 2018-03-21 ENCOUNTER — Encounter (HOSPITAL_COMMUNITY): Payer: Self-pay

## 2018-04-02 ENCOUNTER — Inpatient Hospital Stay (HOSPITAL_COMMUNITY)
Admission: AD | Admit: 2018-04-02 | Discharge: 2018-04-02 | Disposition: A | Discharge status: Home / Self Care | Payer: 59 | Source: Ambulatory Visit | Attending: Obstetrics & Gynecology | Admitting: Obstetrics & Gynecology

## 2018-04-02 ENCOUNTER — Encounter (HOSPITAL_COMMUNITY): Payer: Self-pay

## 2018-04-02 DIAGNOSIS — Z3A38 38 weeks gestation of pregnancy: Secondary | ICD-10-CM | POA: Diagnosis not present

## 2018-04-02 DIAGNOSIS — Z3689 Encounter for other specified antenatal screening: Secondary | ICD-10-CM

## 2018-04-02 DIAGNOSIS — O471 False labor at or after 37 completed weeks of gestation: Secondary | ICD-10-CM | POA: Diagnosis not present

## 2018-04-02 DIAGNOSIS — O479 False labor, unspecified: Secondary | ICD-10-CM | POA: Diagnosis not present

## 2018-04-02 DIAGNOSIS — O34219 Maternal care for unspecified type scar from previous cesarean delivery: Secondary | ICD-10-CM

## 2018-04-02 NOTE — MAU Provider Note (Signed)
S: Ms. Caitlyn Roy is a 32 y.o. [phone] at [redacted]w[redacted]d  who presents to MAU today for labor evaluation. +FM, denies vaginal bleeding or LOF. Repeat C/S scheduled for 11/30. Last ate around 1900 this evening.   Cervical exam by RN: No cervical change upon reassessment  Dilation: Fingertip Effacement (%): 20 Cervical Position: Posterior Station: -3 Presentation: Vertex Exam by:: Kendell Bane, RN   Fetal Monitoring: Baseline: 140 Variability: moderate  Accelerations: present  Decelerations: none  Contractions: 11 minutes apart   MDM Discussed patient with RN. NST reviewed.   A: SIUP at [redacted]w[redacted]d  False labor  P: Discharge home Labor precautions and kick counts included in AVS Patient to follow-up with Joneen Caraway as scheduled  Patient may return to MAU as needed or when in labor  Follow up as scheduled for repeat C/S on 11/30  Lajean Manes, CNM 04/02/2018 9:20 PM

## 2018-04-02 NOTE — Discharge Instructions (Signed)
Reasons to return to MAU: ° °1.  Contractions are 4-5 minutes apart or less, each last 1 minute, these have been going on for 1-2 hours, and you cannot walk or talk during them °2.  You have a large gush of fluid, or a trickle of fluid that will not stop and you have to wear a pad °3.  You have bleeding that is bright red, heavier than spotting--like menstrual bleeding (spotting can be normal in early labor or after a check of your cervix) °4.  You do not feel the baby moving like he/she normally does ° °

## 2018-04-02 NOTE — MAU Note (Signed)
Pt here with c/o contractions. Denies any leaking or bleeding. Scheduled for a repeat C/S on Saturday.

## 2018-04-03 ENCOUNTER — Encounter (HOSPITAL_COMMUNITY)
Admission: RE | Admit: 2018-04-03 | Discharge: 2018-04-03 | Disposition: A | Discharge status: Home / Self Care | Payer: 59 | Source: Ambulatory Visit | Attending: Obstetrics and Gynecology | Admitting: Obstetrics and Gynecology

## 2018-04-03 DIAGNOSIS — Z3A38 38 weeks gestation of pregnancy: Secondary | ICD-10-CM

## 2018-04-03 DIAGNOSIS — Z01818 Encounter for other preprocedural examination: Secondary | ICD-10-CM | POA: Insufficient documentation

## 2018-04-03 HISTORY — DX: Major depressive disorder, single episode, unspecified: F32.9

## 2018-04-03 HISTORY — DX: Depression, unspecified: F32.A

## 2018-04-03 LAB — CBC
HCT: 31.2 % — ABNORMAL LOW (ref 36.0–46.0)
Hemoglobin: 10.1 g/dL — ABNORMAL LOW (ref 12.0–15.0)
MCH: 28.6 pg (ref 26.0–34.0)
MCHC: 32.4 g/dL (ref 30.0–36.0)
MCV: 88.4 fL (ref 80.0–100.0)
Platelets: 268 10*3/uL (ref 150–400)
RBC: 3.53 MIL/uL — ABNORMAL LOW (ref 3.87–5.11)
RDW: 15.1 % (ref 11.5–15.5)
WBC: 7 10*3/uL (ref 4.0–10.5)
nRBC: 0 % (ref 0.0–0.2)

## 2018-04-03 LAB — TYPE AND SCREEN
ABO/RH(D): O NEG
Antibody Screen: NEGATIVE

## 2018-04-03 NOTE — Patient Instructions (Signed)
Caitlyn Roy  04/03/2018   Your procedure is scheduled on:  04/06/2018  Enter through the Main Entrance of [facility] at [place] up the phone at the desk and dial [phone]  Call this number if you have problems the morning of surgery:[phone]  Remember:   Do not eat food:(After Midnight) Desps de median[place].  Do not drink clear liquids: (After Midnight) Desps de medianoche.  Take these medicines the morning of surgery with A SIP OF WATER: may take pepcid   Do not wear jewelry, make-up or nail polish.  Do not wear lotions, powders, or perfumes. Do not wear deodorant.  Do not shave 48 hours prior to surgery.  Do not bring valuables to the hospital.  [facility] is not   responsible for any belongings or valuables brought to the hospital.  Contacts, dentures or bridgework may not be worn into surgery.  Leave suitcase in the car. After surgery it may be brought to your room.  For patients admitted to the hospital, checkout time is 11:00 AM the day of              discharge.    N/A   Please read over the following fact sheets that you were given:   Surgical Site Infection Prevention

## 2018-04-04 LAB — RPR: RPR Ser Ql: NONREACTIVE

## 2018-04-06 ENCOUNTER — Other Ambulatory Visit: Payer: Self-pay

## 2018-04-06 ENCOUNTER — Inpatient Hospital Stay (HOSPITAL_COMMUNITY): Payer: 59 | Admitting: Anesthesiology

## 2018-04-06 ENCOUNTER — Inpatient Hospital Stay (HOSPITAL_COMMUNITY)
Admission: RE | Admit: 2018-04-06 | Discharge: 2018-04-08 | DRG: 788 | Disposition: A | Discharge status: Home / Self Care | Payer: 59 | Attending: Obstetrics and Gynecology | Admitting: Obstetrics and Gynecology

## 2018-04-06 ENCOUNTER — Encounter (HOSPITAL_COMMUNITY)
Admission: RE | Disposition: A | Discharge status: Home / Self Care | Payer: Self-pay | Source: Home / Self Care | Attending: Obstetrics and Gynecology

## 2018-04-06 ENCOUNTER — Encounter (HOSPITAL_COMMUNITY): Payer: Self-pay | Admitting: *Deleted

## 2018-04-06 DIAGNOSIS — M25511 Pain in right shoulder: Secondary | ICD-10-CM | POA: Diagnosis not present

## 2018-04-06 DIAGNOSIS — O26893 Other specified pregnancy related conditions, third trimester: Secondary | ICD-10-CM | POA: Diagnosis present

## 2018-04-06 DIAGNOSIS — Z885 Allergy status to narcotic agent status: Secondary | ICD-10-CM | POA: Diagnosis not present

## 2018-04-06 DIAGNOSIS — D649 Anemia, unspecified: Secondary | ICD-10-CM | POA: Diagnosis present

## 2018-04-06 DIAGNOSIS — O3413 Maternal care for benign tumor of corpus uteri, third trimester: Secondary | ICD-10-CM | POA: Diagnosis present

## 2018-04-06 DIAGNOSIS — O9089 Other complications of the puerperium, not elsewhere classified: Secondary | ICD-10-CM | POA: Diagnosis not present

## 2018-04-06 DIAGNOSIS — O9902 Anemia complicating childbirth: Secondary | ICD-10-CM | POA: Diagnosis present

## 2018-04-06 DIAGNOSIS — Z3A39 39 weeks gestation of pregnancy: Secondary | ICD-10-CM

## 2018-04-06 DIAGNOSIS — O34211 Maternal care for low transverse scar from previous cesarean delivery: Principal | ICD-10-CM | POA: Diagnosis present

## 2018-04-06 DIAGNOSIS — D259 Leiomyoma of uterus, unspecified: Secondary | ICD-10-CM | POA: Diagnosis present

## 2018-04-06 DIAGNOSIS — Z6791 Unspecified blood type, Rh negative: Secondary | ICD-10-CM | POA: Diagnosis not present

## 2018-04-06 DIAGNOSIS — Z98891 History of uterine scar from previous surgery: Secondary | ICD-10-CM

## 2018-04-06 HISTORY — DX: History of uterine scar from previous surgery: Z98.891

## 2018-04-06 HISTORY — DX: Benign neoplasm of connective and other soft tissue, unspecified: D21.9

## 2018-04-06 LAB — CBC
HCT: 31.9 % — ABNORMAL LOW (ref 36.0–46.0)
Hemoglobin: 10.2 g/dL — ABNORMAL LOW (ref 12.0–15.0)
MCH: 28.3 pg (ref 26.0–34.0)
MCHC: 32 g/dL (ref 30.0–36.0)
MCV: 88.6 fL (ref 80.0–100.0)
Platelets: 274 10*3/uL (ref 150–400)
RBC: 3.6 MIL/uL — ABNORMAL LOW (ref 3.87–5.11)
RDW: 14.6 % (ref 11.5–15.5)
WBC: 11.9 10*3/uL — ABNORMAL HIGH (ref 4.0–10.5)
nRBC: 0 % (ref 0.0–0.2)

## 2018-04-06 LAB — CREATININE, SERUM
Creatinine, Ser: 0.54 mg/dL (ref 0.44–1.00)
GFR calc Af Amer: >60 mL/min (ref >60)
GFR calc non Af Amer: >60 mL/min (ref >60)

## 2018-04-06 SURGERY — Surgical Case
Anesthesia: Spinal | Wound class: Clean Contaminated

## 2018-04-06 MED ORDER — DIPHENHYDRAMINE HCL 50 MG/ML IJ SOLN
INTRAMUSCULAR | Status: DC | PRN
  Administered 2018-04-06: 25 mg via INTRAVENOUS

## 2018-04-06 MED ORDER — DIPHENHYDRAMINE HCL 25 MG PO CAPS: 25.0000 mg | ORAL_CAPSULE | ORAL | Status: DC | PRN

## 2018-04-06 MED ORDER — TETANUS-DIPHTH-ACELL PERTUSSIS 5-2.5-18.5 LF-MCG/0.5 IM SUSP: 0.5000 mL | Freq: Once | INTRAMUSCULAR | Status: DC

## 2018-04-06 MED ORDER — METOCLOPRAMIDE HCL 5 MG/ML IJ SOLN
INTRAMUSCULAR | Status: AC
  Filled 2018-04-06: qty 2

## 2018-04-06 MED ORDER — KETOROLAC TROMETHAMINE 30 MG/ML IJ SOLN
INTRAMUSCULAR | Status: AC
  Filled 2018-04-06: qty 1

## 2018-04-06 MED ORDER — METHYLERGONOVINE MALEATE 0.2 MG PO TABS: 0.2000 mg | ORAL_TABLET | ORAL | Status: DC | PRN

## 2018-04-06 MED ORDER — ACETAMINOPHEN 325 MG PO TABS
650.0000 mg | ORAL_TABLET | ORAL | Status: DC | PRN
  Administered 2018-04-07 – 2018-04-08 (×2): 650 mg via ORAL
  Filled 2018-04-06 (×2): qty 2

## 2018-04-06 MED ORDER — NALBUPHINE HCL 10 MG/ML IJ SOLN: 5.0000 mg | Freq: Once | INTRAMUSCULAR | Status: DC | PRN

## 2018-04-06 MED ORDER — LACTATED RINGERS IV SOLN: INTRAVENOUS | Status: DC

## 2018-04-06 MED ORDER — METOCLOPRAMIDE HCL 5 MG/ML IJ SOLN
INTRAMUSCULAR | Status: DC | PRN
  Administered 2018-04-06: 10 mg via INTRAVENOUS

## 2018-04-06 MED ORDER — SIMETHICONE 80 MG PO CHEW: 80.0000 mg | CHEWABLE_TABLET | ORAL | Status: DC | PRN

## 2018-04-06 MED ORDER — KETOROLAC TROMETHAMINE 30 MG/ML IJ SOLN
30.0000 mg | Freq: Once | INTRAMUSCULAR | Status: AC | PRN
  Administered 2018-04-06: 30 mg via INTRAVENOUS

## 2018-04-06 MED ORDER — DIPHENHYDRAMINE HCL 50 MG/ML IJ SOLN: 12.5000 mg | INTRAMUSCULAR | Status: DC | PRN

## 2018-04-06 MED ORDER — ENOXAPARIN SODIUM 40 MG/0.4ML ~~LOC~~ SOLN
40.0000 mg | SUBCUTANEOUS | Status: DC
  Administered 2018-04-07 – 2018-04-08 (×2): 40 mg via SUBCUTANEOUS
  Filled 2018-04-06 (×2): qty 0.4

## 2018-04-06 MED ORDER — FENTANYL CITRATE (PF) 100 MCG/2ML IJ SOLN
INTRAMUSCULAR | Status: AC
  Filled 2018-04-06: qty 2

## 2018-04-06 MED ORDER — KETOROLAC TROMETHAMINE 30 MG/ML IJ SOLN: 30.0000 mg | Freq: Four times a day (QID) | INTRAMUSCULAR | Status: AC | PRN

## 2018-04-06 MED ORDER — HYDROMORPHONE HCL 2 MG PO TABS
2.0000 mg | ORAL_TABLET | ORAL | Status: DC | PRN
  Administered 2018-04-07 – 2018-04-08 (×4): 2 mg via ORAL
  Filled 2018-04-06 (×4): qty 1

## 2018-04-06 MED ORDER — HYDROMORPHONE HCL 1 MG/ML IJ SOLN
INTRAMUSCULAR | Status: AC
  Administered 2018-04-06: 0.5 mg via INTRAVENOUS
  Filled 2018-04-06: qty 0.5

## 2018-04-06 MED ORDER — METHYLERGONOVINE MALEATE 0.2 MG/ML IJ SOLN: 0.2000 mg | INTRAMUSCULAR | Status: DC | PRN

## 2018-04-06 MED ORDER — CEFAZOLIN SODIUM-DEXTROSE 2-4 GM/100ML-% IV SOLN
2.0000 g | INTRAVENOUS | Status: AC
  Administered 2018-04-06: 2 g via INTRAVENOUS

## 2018-04-06 MED ORDER — OXYTOCIN 10 UNIT/ML IJ SOLN
INTRAVENOUS | Status: DC | PRN
  Administered 2018-04-06: 40 [IU] via INTRAVENOUS

## 2018-04-06 MED ORDER — WITCH HAZEL-GLYCERIN EX PADS: 1.0000 "application " | MEDICATED_PAD | CUTANEOUS | Status: DC | PRN

## 2018-04-06 MED ORDER — SCOPOLAMINE 1 MG/3DAYS TD PT72
MEDICATED_PATCH | TRANSDERMAL | Status: AC
  Filled 2018-04-06: qty 1

## 2018-04-06 MED ORDER — BUPIVACAINE HCL (PF) 0.25 % IJ SOLN
INTRAMUSCULAR | Status: AC
  Filled 2018-04-06: qty 30

## 2018-04-06 MED ORDER — MENTHOL 3 MG MT LOZG: 1.0000 | LOZENGE | OROMUCOSAL | Status: DC | PRN

## 2018-04-06 MED ORDER — DEXAMETHASONE SODIUM PHOSPHATE 4 MG/ML IJ SOLN
INTRAMUSCULAR | Status: DC | PRN
  Administered 2018-04-06: 4 mg via INTRAVENOUS

## 2018-04-06 MED ORDER — PRENATAL MULTIVITAMIN CH
1.0000 | ORAL_TABLET | Freq: Every day | ORAL | Status: DC
  Administered 2018-04-07 – 2018-04-08 (×2): 1 via ORAL
  Filled 2018-04-06 (×2): qty 1

## 2018-04-06 MED ORDER — MEPERIDINE HCL 25 MG/ML IJ SOLN: 6.2500 mg | INTRAMUSCULAR | Status: DC | PRN

## 2018-04-06 MED ORDER — PHENYLEPHRINE 8 MG IN D5W 100 ML (0.08MG/ML) PREMIX OPTIME
INJECTION | INTRAVENOUS | Status: DC | PRN
  Administered 2018-04-06: 20 ug/min via INTRAVENOUS

## 2018-04-06 MED ORDER — PROMETHAZINE HCL 25 MG/ML IJ SOLN: 6.2500 mg | INTRAMUSCULAR | Status: DC | PRN

## 2018-04-06 MED ORDER — PHENYLEPHRINE 40 MCG/ML (10ML) SYRINGE FOR IV PUSH (FOR BLOOD PRESSURE SUPPORT)
PREFILLED_SYRINGE | INTRAVENOUS | Status: AC
  Filled 2018-04-06: qty 20

## 2018-04-06 MED ORDER — PHENYLEPHRINE HCL 10 MG/ML IJ SOLN
INTRAMUSCULAR | Status: DC | PRN
  Administered 2018-04-06: 80 ug via INTRAVENOUS
  Administered 2018-04-06 (×3): 40 ug via INTRAVENOUS
  Administered 2018-04-06: 120 ug via INTRAVENOUS

## 2018-04-06 MED ORDER — LACTATED RINGERS IV SOLN
INTRAVENOUS | Status: DC
  Administered 2018-04-06 (×5): via INTRAVENOUS

## 2018-04-06 MED ORDER — MORPHINE SULFATE (PF) 0.5 MG/ML IJ SOLN
INTRAMUSCULAR | Status: AC
  Filled 2018-04-06: qty 10

## 2018-04-06 MED ORDER — ONDANSETRON HCL 4 MG/2ML IJ SOLN: 4.0000 mg | Freq: Three times a day (TID) | INTRAMUSCULAR | Status: DC | PRN

## 2018-04-06 MED ORDER — SCOPOLAMINE 1 MG/3DAYS TD PT72
MEDICATED_PATCH | TRANSDERMAL | Status: DC | PRN
  Administered 2018-04-06: 1 via TRANSDERMAL

## 2018-04-06 MED ORDER — IBUPROFEN 600 MG PO TABS
600.0000 mg | ORAL_TABLET | Freq: Four times a day (QID) | ORAL | Status: DC
  Administered 2018-04-06 – 2018-04-08 (×6): 600 mg via ORAL
  Filled 2018-04-06 (×7): qty 1

## 2018-04-06 MED ORDER — SENNOSIDES-DOCUSATE SODIUM 8.6-50 MG PO TABS
2.0000 | ORAL_TABLET | ORAL | Status: DC
  Administered 2018-04-07 (×2): 2 via ORAL
  Filled 2018-04-06 (×2): qty 2

## 2018-04-06 MED ORDER — DIPHENHYDRAMINE HCL 50 MG/ML IJ SOLN
INTRAMUSCULAR | Status: AC
  Filled 2018-04-06: qty 1

## 2018-04-06 MED ORDER — ACETAMINOPHEN 500 MG PO TABS
1000.0000 mg | ORAL_TABLET | Freq: Four times a day (QID) | ORAL | Status: AC
  Administered 2018-04-06 – 2018-04-07 (×3): 1000 mg via ORAL
  Filled 2018-04-06 (×4): qty 2

## 2018-04-06 MED ORDER — HYDROMORPHONE HCL 1 MG/ML IJ SOLN
0.2500 mg | INTRAMUSCULAR | Status: DC | PRN
  Administered 2018-04-06 (×2): 0.5 mg via INTRAVENOUS

## 2018-04-06 MED ORDER — MORPHINE SULFATE (PF) 0.5 MG/ML IJ SOLN
INTRAMUSCULAR | Status: DC | PRN
  Administered 2018-04-06: .15 mg via INTRATHECAL

## 2018-04-06 MED ORDER — NALOXONE HCL 0.4 MG/ML IJ SOLN: 0.4000 mg | INTRAMUSCULAR | Status: DC | PRN

## 2018-04-06 MED ORDER — OXYTOCIN 40 UNITS IN LACTATED RINGERS INFUSION - SIMPLE MED
2.5000 [IU]/h | INTRAVENOUS | Status: AC
  Administered 2018-04-06: 2.5 [IU]/h via INTRAVENOUS
  Filled 2018-04-06: qty 1000

## 2018-04-06 MED ORDER — OXYTOCIN 10 UNIT/ML IJ SOLN
INTRAMUSCULAR | Status: AC
  Filled 2018-04-06: qty 4

## 2018-04-06 MED ORDER — ZOLPIDEM TARTRATE 5 MG PO TABS: 5.0000 mg | ORAL_TABLET | Freq: Every evening | ORAL | Status: DC | PRN

## 2018-04-06 MED ORDER — COCONUT OIL OIL: 1.0000 "application " | TOPICAL_OIL | Status: DC | PRN

## 2018-04-06 MED ORDER — DIBUCAINE 1 % RE OINT: 1.0000 "application " | TOPICAL_OINTMENT | RECTAL | Status: DC | PRN

## 2018-04-06 MED ORDER — ONDANSETRON HCL 4 MG/2ML IJ SOLN
INTRAMUSCULAR | Status: AC
  Filled 2018-04-06: qty 2

## 2018-04-06 MED ORDER — ONDANSETRON HCL 4 MG/2ML IJ SOLN
INTRAMUSCULAR | Status: DC | PRN
  Administered 2018-04-06: 4 mg via INTRAVENOUS

## 2018-04-06 MED ORDER — SCOPOLAMINE 1 MG/3DAYS TD PT72: 1.0000 | MEDICATED_PATCH | Freq: Once | TRANSDERMAL | Status: DC

## 2018-04-06 MED ORDER — NALBUPHINE HCL 10 MG/ML IJ SOLN: 5.0000 mg | INTRAMUSCULAR | Status: DC | PRN

## 2018-04-06 MED ORDER — DEXAMETHASONE SODIUM PHOSPHATE 4 MG/ML IJ SOLN
INTRAMUSCULAR | Status: AC
  Filled 2018-04-06: qty 1

## 2018-04-06 MED ORDER — SCOPOLAMINE 1 MG/3DAYS TD PT72
1.0000 | MEDICATED_PATCH | Freq: Once | TRANSDERMAL | Status: DC
  Filled 2018-04-06: qty 1

## 2018-04-06 MED ORDER — BUPIVACAINE IN DEXTROSE 0.75-8.25 % IT SOLN
INTRATHECAL | Status: DC | PRN
  Administered 2018-04-06: 1.4 mL via INTRATHECAL

## 2018-04-06 MED ORDER — LACTATED RINGERS IV SOLN
INTRAVENOUS | Status: DC | PRN
  Administered 2018-04-06: 12:00:00 via INTRAVENOUS

## 2018-04-06 MED ORDER — SODIUM CHLORIDE 0.9% FLUSH: 3.0000 mL | INTRAVENOUS | Status: DC | PRN

## 2018-04-06 MED ORDER — FENTANYL CITRATE (PF) 100 MCG/2ML IJ SOLN
INTRAMUSCULAR | Status: DC | PRN
  Administered 2018-04-06 (×2): 25 ug via INTRAVENOUS
  Administered 2018-04-06: 35 ug via INTRAVENOUS
  Administered 2018-04-06: 50 ug via INTRAVENOUS
  Administered 2018-04-06: 15 ug via INTRATHECAL

## 2018-04-06 MED ORDER — HYDROMORPHONE HCL 1 MG/ML IJ SOLN
INTRAMUSCULAR | Status: AC
  Filled 2018-04-06: qty 0.5

## 2018-04-06 MED ORDER — SOD CITRATE-CITRIC ACID 500-334 MG/5ML PO SOLN
30.0000 mL | Freq: Once | ORAL | Status: AC
  Administered 2018-04-06: 30 mL via ORAL
  Filled 2018-04-06: qty 15

## 2018-04-06 MED ORDER — NALOXONE HCL 4 MG/10ML IJ SOLN
1.0000 ug/kg/h | INTRAVENOUS | Status: DC | PRN
  Filled 2018-04-06: qty 5

## 2018-04-06 MED ORDER — EPHEDRINE SULFATE 50 MG/ML IJ SOLN
INTRAMUSCULAR | Status: DC | PRN
  Administered 2018-04-06: 5 mg via INTRAVENOUS

## 2018-04-06 MED ORDER — DIPHENHYDRAMINE HCL 25 MG PO CAPS
25.0000 mg | ORAL_CAPSULE | Freq: Four times a day (QID) | ORAL | Status: DC | PRN
  Administered 2018-04-06 – 2018-04-07 (×2): 25 mg via ORAL
  Filled 2018-04-06 (×2): qty 1

## 2018-04-06 MED ORDER — PHENYLEPHRINE 8 MG IN D5W 100 ML (0.08MG/ML) PREMIX OPTIME
INJECTION | INTRAVENOUS | Status: AC
  Filled 2018-04-06: qty 100

## 2018-04-06 MED ORDER — SIMETHICONE 80 MG PO CHEW
80.0000 mg | CHEWABLE_TABLET | ORAL | Status: DC
  Administered 2018-04-07 (×2): 80 mg via ORAL
  Filled 2018-04-06: qty 1

## 2018-04-06 MED ORDER — SIMETHICONE 80 MG PO CHEW
80.0000 mg | CHEWABLE_TABLET | Freq: Three times a day (TID) | ORAL | Status: DC
  Administered 2018-04-06 – 2018-04-08 (×5): 80 mg via ORAL
  Filled 2018-04-06 (×6): qty 1

## 2018-04-06 MED ORDER — SOD CITRATE-CITRIC ACID 500-334 MG/5ML PO SOLN: 30.0000 mL | Freq: Once | ORAL | Status: AC

## 2018-04-06 MED ORDER — FAMOTIDINE 20 MG PO TABS
20.0000 mg | ORAL_TABLET | Freq: Every day | ORAL | Status: DC
  Administered 2018-04-07 – 2018-04-08 (×2): 20 mg via ORAL
  Filled 2018-04-06 (×2): qty 1

## 2018-04-06 SURGICAL SUPPLY — 43 items
BARRIER ADHS 3X4 INTERCEED (GAUZE/BANDAGES/DRESSINGS) ×6 IMPLANT
BENZOIN TINCTURE PRP APPL 2/3 (GAUZE/BANDAGES/DRESSINGS) ×3 IMPLANT
CHLORAPREP W/TINT 26ML (MISCELLANEOUS) ×3 IMPLANT
CLAMP CORD UMBIL (MISCELLANEOUS) IMPLANT
CLOSURE WOUND 1/2 X4 (GAUZE/BANDAGES/DRESSINGS) ×1
CLOTH BEACON ORANGE TIMEOUT ST (SAFETY) ×3 IMPLANT
DERMABOND ADVANCED (GAUZE/BANDAGES/DRESSINGS)
DERMABOND ADVANCED .7 DNX12 (GAUZE/BANDAGES/DRESSINGS) IMPLANT
DRSG OPSITE POSTOP 4X10 (GAUZE/BANDAGES/DRESSINGS) ×3 IMPLANT
ELECT REM PT RETURN 9FT ADLT (ELECTROSURGICAL) ×3
ELECTRODE REM PT RTRN 9FT ADLT (ELECTROSURGICAL) ×1 IMPLANT
EXTRACTOR VACUUM BELL CUP MITY (SUCTIONS) ×3 IMPLANT
EXTRACTOR VACUUM BELL STYLE (SUCTIONS) IMPLANT
EXTRACTOR VACUUM KIWI (MISCELLANEOUS) ×3 IMPLANT
GAUZE SPONGE 4X4 3PLY NS LF (GAUZE/BANDAGES/DRESSINGS) ×6 IMPLANT
GLOVE BIO SURGEON STRL SZ7 (GLOVE) ×3 IMPLANT
GLOVE BIOGEL PI IND STRL 7.0 (GLOVE) ×2 IMPLANT
GLOVE BIOGEL PI INDICATOR 7.0 (GLOVE) ×4
GOWN STRL REUS W/TWL LRG LVL3 (GOWN DISPOSABLE) ×6 IMPLANT
HEMOSTAT ARISTA ABSORB 3G PWDR (MISCELLANEOUS) ×3 IMPLANT
KIT ABG SYR 3ML LUER SLIP (SYRINGE) IMPLANT
NEEDLE HYPO 25X5/8 SAFETYGLIDE (NEEDLE) IMPLANT
NS IRRIG 1000ML POUR BTL (IV SOLUTION) ×3 IMPLANT
PACK C SECTION WH (CUSTOM PROCEDURE TRAY) ×3 IMPLANT
PAD ABD 8X10 STRL (GAUZE/BANDAGES/DRESSINGS) ×3 IMPLANT
PAD OB MATERNITY 4.3X12.25 (PERSONAL CARE ITEMS) ×3 IMPLANT
PENCIL SMOKE EVAC W/HOLSTER (ELECTROSURGICAL) ×3 IMPLANT
RTRCTR C-SECT PINK 25CM LRG (MISCELLANEOUS) ×3 IMPLANT
SPONGE LAP 18X18 RF (DISPOSABLE) ×9 IMPLANT
STRIP CLOSURE SKIN 1/2X4 (GAUZE/BANDAGES/DRESSINGS) ×2 IMPLANT
SUT CHROMIC 0 CTX 36 (SUTURE) IMPLANT
SUT MON AB 4-0 PS1 27 (SUTURE) ×3 IMPLANT
SUT PLAIN 0 NONE (SUTURE) IMPLANT
SUT PLAIN 2 0 XLH (SUTURE) ×3 IMPLANT
SUT PROLENE 1 CT (SUTURE) ×3 IMPLANT
SUT VIC AB 0 CTX 36 (SUTURE) ×10
SUT VIC AB 0 CTX36XBRD ANBCTRL (SUTURE) ×5 IMPLANT
SUT VIC AB 2-0 CT1 27 (SUTURE) ×2
SUT VIC AB 2-0 CT1 TAPERPNT 27 (SUTURE) ×1 IMPLANT
SUT VIC AB 2-0 SH 27 (SUTURE) ×6
SUT VIC AB 2-0 SH 27XBRD (SUTURE) ×3 IMPLANT
TOWEL OR 17X24 6PK STRL BLUE (TOWEL DISPOSABLE) ×3 IMPLANT
TRAY FOLEY W/BAG SLVR 14FR LF (SET/KITS/TRAYS/PACK) IMPLANT

## 2018-04-06 NOTE — Anesthesia Postprocedure Evaluation (Signed)
Anesthesia Post Note  Patient: Caitlyn Roy  Procedure(s) Performed: CESAREAN SECTION bladder irrigation (N/A )     Patient location during evaluation: PACU Anesthesia Type: Spinal Level of consciousness: oriented and awake and alert Pain management: satisfactory to patient Vital Signs Assessment: post-procedure vital signs reviewed and stable Respiratory status: spontaneous breathing, respiratory function stable and nonlabored ventilation Cardiovascular status: blood pressure returned to baseline and stable Postop Assessment: no headache, no backache, no apparent nausea or vomiting and spinal receding Anesthetic complications: no    Last Vitals:  Vitals:   04/06/18 1430 04/06/18 1438  BP: (!) 118/53   Pulse: (!) 114 96  Resp: 17 (!) 23  Temp:  36.9 C  SpO2: 97% 98%    Last Pain:  Vitals:   04/06/18 1438  TempSrc: Oral  PainSc: 8    Pain Goal: Patients Stated Pain Goal: 4 (04/06/18 1406)               Lidia Collum

## 2018-04-06 NOTE — Transfer of Care (Signed)
Immediate Anesthesia Transfer of Care Note  Patient: Caitlyn Roy  Procedure(s) Performed: CESAREAN SECTION bladder irrigation (N/A )  Patient Location: PACU  Anesthesia Type:Spinal  Level of Consciousness: awake, alert  and oriented  Airway & Oxygen Therapy: Patient Spontanous Breathing  Post-op Assessment: Report given to RN and Post -op Vital signs reviewed and stable  Post vital signs: Reviewed and stable  Last Vitals:  Vitals Value Taken Time  BP    Temp    Pulse    Resp    SpO2      Last Pain:  Vitals:   04/06/18 0959  TempSrc: Oral  PainSc:          Complications: No apparent anesthesia complications

## 2018-04-06 NOTE — H&P (Signed)
Caitlyn Roy is a 32 y.o. female G8 [phone] @ 37 4/7 weeks scheduled for repeat c-section on 04/06/18. PNC with [facility] Simona Huh) has been complicated by anemia.  Pt has had c/o SOB and palpitations which she has had prior to pregnancy.  Pt got 2 iron infusions without resolution of symptoms per pt, but she denies SOB currently.   OB History    Gravida  8   Para  1   Term  1   Preterm      AB  6   Living  1     SAB  4   TAB  1   Ectopic  1   Multiple      Live Births  1          Past Medical History:  Diagnosis Date  . Anemia   . Anginal pain ([place])    being followed by cardiologists  . Anxiety   . Depression   . Dysrhythmia   . Nausea    d/t to "issues with gallballader"  . 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 Tubal pregnancy   . Vaginal odor   . Vaginitis    Past Surgical History:  Procedure Laterality Date  . BIOPSY EYE MUSCLE    . CESAREAN SECTION  2010  . LAPAROSCOPIC APPENDECTOMY N/A 10/20/2016   Procedure: APPENDECTOMY LAPAROSCOPIC;  Surgeon: Judeth Horn, MD;  Location: [place];  Service: General;  Laterality: N/A;  . laporoscopy     Family History: family history includes Alcohol abuse in her father; Breast cancer in her mother; Diabetes in her paternal grandmother; Heart attack in her mother; Hypertension in her mother; Kidney disease in her maternal grandmother; Seizures in her mother. Social History:  reports that she has never smoked. She has never used smokeless tobacco. She reports that she does not drink alcohol or use drugs.     Maternal Diabetes: No Genetic Screening: Normal Maternal Ultrasounds/Referrals: Normal Fetal Ultrasounds or other Referrals:  None Maternal Substance Abuse:  No Significant Maternal Medications:  Meds include: Other: Iron Significant Maternal Lab Results:  Lab values include: Group B Strep negative Other Comments:  None  Review of Systems   Constitutional: Positive for malaise/fatigue.  Cardiovascular: Positive for palpitations.  Gastrointestinal: Positive for abdominal pain.   Maternal Medical History:  Contractions: Frequency: irregular.   Perceived severity is moderate.    Fetal activity: Perceived fetal activity is normal.    Prenatal complications: Anemia UTI Fibroid 4 cm  Prenatal Complications - Diabetes: none.      Last menstrual period 07/12/2017. Maternal Exam:  Abdomen: Patient reports no abdominal tenderness. Estimated fetal weight is 7 pounds.   Fetal presentation: vertex     Fetal Exam Fetal Monitor Review: Mode: hand-held doppler probe.   Baseline rate: 143.      Physical Exam  Constitutional: She is oriented to person, place, and time. She appears well-developed and well-nourished. No distress.  HENT:  Head: Normocephalic and atraumatic.  Eyes: EOM are normal.  Neck: Normal range of motion.  Cardiovascular: Normal rate, regular rhythm and normal heart sounds.  Respiratory: Effort normal and breath sounds normal. No respiratory distress. She has no wheezes.  GI: There is no tenderness.  Musculoskeletal: Normal range of motion.  Neurological: She is alert and oriented to person, place, and time.  Skin: Skin is warm and dry.  Psychiatric: She has a normal mood and affect.    Prenatal  labs: ABO, Rh: --/--/O NEG (11/27 0920) Antibody: NEG (11/27 0920) Rubella: Immune (04/22 0000) RPR: Non Reactive (11/27 0920)  HBsAg: Negative (04/22 0000)  HIV: Non-reactive (04/22 0000)  GBS:   Negtive  Assessment/Plan: IUP @ 37 4/7 weeks H/o cesarean section-desires repeat. Anemia, s/p iron infusion. Fibroid  Pt counseled on Repeat c-section, R/B/A.  Pt also understands vaginal deliveries after a second c-section are at higher risk of rupture.  Pt is not opposed to a blood transfusion.    [name]

## 2018-04-06 NOTE — Interval H&P Note (Signed)
History and Physical Interval Note:  04/06/2018 11:12 AM  Caitlyn Roy  has presented today for surgery, with the diagnosis of Z98.891 H/O cesarean section  The various methods of treatment have been discussed with the patient and family. After consideration of risks, benefits and other options for treatment, the patient has consented to  Procedure(s) with comments: CESAREAN SECTION (N/A) - needs RNFA as a surgical intervention .  The patient's history has been reviewed, patient examined, no change in status, stable for surgery.  I have reviewed the patient's chart and labs.  Questions were answered to the patient's satisfaction.     [name]

## 2018-04-06 NOTE — Progress Notes (Signed)
FHR dopplered at 124-138 at 10:50am [employer], RN 04/06/2018 10:50 AM

## 2018-04-06 NOTE — Anesthesia Preprocedure Evaluation (Addendum)
Anesthesia Evaluation  Patient identified by MRN, date of birth, ID band Patient awake    Reviewed: Allergy & Precautions, H&P , NPO status , Patient's Chart, lab work & pertinent test results  Airway Mallampati: I  TM Distance: >3 FB Neck ROM: full    Dental no notable dental hx. (+) Teeth Intact   Pulmonary neg pulmonary ROS,    Pulmonary exam normal breath sounds clear to auscultation       Cardiovascular negative cardio ROS Normal cardiovascular exam Rhythm:regular Rate:Normal     Neuro/Psych negative neurological ROS     GI/Hepatic Neg liver ROS, GERD  Medicated,  Endo/Other  negative endocrine ROS  Renal/GU negative Renal ROS     Musculoskeletal   Abdominal (+) + obese,   Peds  Hematology  (+) Blood dyscrasia, anemia ,   Anesthesia Other Findings   Reproductive/Obstetrics (+) Pregnancy                             Anesthesia Physical Anesthesia Plan  ASA: II  Anesthesia Plan: Spinal   Post-op Pain Management:    Induction:   PONV Risk Score and Plan: 3 and Dexamethasone, Ondansetron and Scopolamine patch - Pre-op  Airway Management Planned: Natural Airway and Nasal Cannula  Additional Equipment:   Intra-op Plan:   Post-operative Plan:   Informed Consent: I have reviewed the patients History and Physical, chart, labs and discussed the procedure including the risks, benefits and alternatives for the proposed anesthesia with the patient or authorized representative who has indicated his/her understanding and acceptance.     Plan Discussed with: CRNA and Surgeon  Anesthesia Plan Comments:         Anesthesia Quick Evaluation

## 2018-04-06 NOTE — Brief Op Note (Signed)
04/06/2018  1:19 PM  PATIENT:  Caitlyn Roy  32 y.o. female  PRE-OPERATIVE DIAGNOSIS:  IUP @ 39 1/7 weeks, H/O cesarean section  POST-OPERATIVE DIAGNOSIS: Same, dense adhesions  PROCEDURE:  Procedure(s) with comments: CESAREAN SECTION bladder irrigation (N/A) - needs RNFA Repeat LTCS Vacuum assisted delivery Lysis of adhesions  SURGEON:  Surgeon(s) and Role:    [name], MD - Primary  PHYSICIAN ASSISTANT:   ASSISTANTS: Technicians   ANESTHESIA:   spinal  EBL:  220 mL   BLOOD ADMINISTERED:none  DRAINS: Urinary Catheter (Foley)   LOCAL MEDICATIONS USED:  NONE  SPECIMEN:  No Specimen  DISPOSITION OF SPECIMEN:  n/a  COUNTS:  YES  TOURNIQUET:  * No tourniquets in log *  DICTATION: .Other Dictation: Dictation Number [id number]  PLAN OF CARE: Admit to inpatient   PATIENT DISPOSITION:  PACU - hemodynamically stable.   Delay start of Pharmacological VTE agent (>24hrs) due to surgical blood loss or risk of bleeding: yes

## 2018-04-06 NOTE — Lactation Note (Signed)
This note was copied from a baby's chart. Lactation Consultation Note  Patient Name: Caitlyn Roy DGLOV'F Date: 04/06/2018 Reason for consult: Initial assessment;Term  5 hours old FT female who is being mostly formula feeding at this point; she's a P2. Mom only BF her oldest child (he's now 32 y.o) for only 1 week; she would not put baby to the breast every day because "it didn't feel right, it felt uncomfortable" so she got engorged. Mom doesn't have a pump at home and she's still undecided if she'll be purchasing one; she participate in the [facility] program  Mom plans to pump and bottle and requested a DEBP to be set up. Reviewed instructions, cleaning and storage, as well as milk storage guidelines. Also, showed mom how to convert her DEBP kit into a hand pump. Mom knows how to hand express, but she hasn't been able to see any colostrum yet. LC offered to help but mom politely declined, room was full of visitors and mom wanted them to stay.  Feeding plan:  1. Mom will be bottle feeding while at the hospital 2. She'll also start pumping, explained to her that for a full milk supply she should pump every 3 hours; 8 pumping sessions/24 hours , but since she plans to do both, she may just pump for comfort  Reviewed BF brochure, BF resources and feeding diary. Parents reported all questions and concerns were answered, they're both aware of [place] services and will call PRN.  Maternal Data Formula Feeding for Exclusion: Yes Reason for exclusion: Mother's choice to formula and breast feed on admission Has patient been taught Hand Expression?: Yes  Feeding Feeding Type: Bottle Fed - Formula Nipple Type: Slow - flow    Interventions Interventions: Breast feeding basics reviewed;DEBP  Lactation Tools Discussed/Used Tools: Pump Breast pump type: Double-Electric Breast Pump WIC Program: No Pump Review: Setup, frequency, and cleaning;Milk Storage Initiated by:: MPeck Date initiated::  04/06/18   Consult Status Consult Status: PRN Date: 04/07/18 Follow-up type: In-patient    Adylee Leonardo Francene Boyers 04/06/2018, 5:56 PM

## 2018-04-06 NOTE — Anesthesia Procedure Notes (Signed)
Spinal  Patient location during procedure: OR Start time: 04/06/2018 11:27 AM End time: 04/06/2018 11:33 AM Staffing Anesthesiologist: Lyn Hollingshead, MD Performed: anesthesiologist  Preanesthetic Checklist Completed: patient identified, site marked, surgical consent, pre-op evaluation, timeout performed, IV checked, risks and benefits discussed and monitors and equipment checked Spinal Block Patient position: sitting Prep: site prepped and draped and DuraPrep Patient monitoring: continuous pulse ox and blood pressure Approach: midline Location: L3-4 Injection technique: single-shot Needle Needle type: Pencan  Needle gauge: 24 G Needle length: 10 cm Needle insertion depth: 5 cm Assessment Sensory level: T6

## 2018-04-06 NOTE — Progress Notes (Signed)
Patient requesting to wait to have orthostatic BP's taken and walk in room until guests leave.

## 2018-04-07 ENCOUNTER — Encounter (HOSPITAL_COMMUNITY): Payer: Self-pay | Admitting: Obstetrics and Gynecology

## 2018-04-07 LAB — CBC
HCT: 27.2 % — ABNORMAL LOW (ref 36.0–46.0)
Hemoglobin: 9.1 g/dL — ABNORMAL LOW (ref 12.0–15.0)
MCH: 29.3 pg (ref 26.0–34.0)
MCHC: 33.5 g/dL (ref 30.0–36.0)
MCV: 87.5 fL (ref 80.0–100.0)
Platelets: 227 10*3/uL (ref 150–400)
RBC: 3.11 MIL/uL — ABNORMAL LOW (ref 3.87–5.11)
RDW: 14.8 % (ref 11.5–15.5)
WBC: 10 10*3/uL (ref 4.0–10.5)
nRBC: 0 % (ref 0.0–0.2)

## 2018-04-07 MED ORDER — FERROUS SULFATE 325 (65 FE) MG PO TABS
325.0000 mg | ORAL_TABLET | Freq: Two times a day (BID) | ORAL | Status: DC
  Administered 2018-04-07 – 2018-04-08 (×3): 325 mg via ORAL
  Filled 2018-04-07 (×3): qty 1

## 2018-04-07 MED ORDER — RHO D IMMUNE GLOBULIN 1500 UNIT/2ML IJ SOSY
300.0000 ug | PREFILLED_SYRINGE | Freq: Once | INTRAMUSCULAR | Status: AC
  Administered 2018-04-07: 300 ug via INTRAVENOUS
  Filled 2018-04-07: qty 2

## 2018-04-07 NOTE — Op Note (Signed)
Caitlyn Roy, EISCHEID MEDICAL RECORD [id number] ACCOUNT [id number] DATE OF BIRTH:1986-02-18 FACILITY: [facility] LOCATION: [id number] PHYSICIAN:Jeanelle Dake Al Decant, MD  OPERATIVE REPORT  DATE OF PROCEDURE:  04/06/2018  PREOPERATIVE DIAGNOSES:  Intrauterine pregnancy at 54 and 1/7 weeks, history of cesarean section, desires repeat.  POSTOPERATIVE DIAGNOSIS:  Intrauterine pregnancy at 68 and 1/7 weeks, history of cesarean section, desires repeat.  Dense adhesions.  PROCEDURES:  Repeat low transverse cesarean section with one layer closure.  Vacuum-assisted delivery and lysis of adhesions.  SURGEON:  [name], MD  ASSISTANT:  Technician x2.  ANESTHESIA:  Spinal.  BLOOD ADMINISTERED:  None.  DRAINS:  Foley.  SPECIMEN:  No specimen.  DISPOSITION:  To PACU hemodynamically stable.  FINDINGS:  Viable female infant in the vertex position, clear fluid.  Normal or dense uterine adhesion to the peritoneum and rectus muscles and bladder and the lower uterine segment.  Two small fibroids, approximately 2 cm each, on the right lower  uterine segment.  Left fallopian tube and ovary appeared normal.  Right tube and ovary palpated normal.  Apgars were 8 and 9.  DESCRIPTION OF PROCEDURE:  The patient was identified in the holding area.  She was then taken to the operating room with IV running.  She underwent spinal anesthesia without complication.  She was then placed in the dorsal supine position with a  leftward tilt.  She was prepped in a normal sterile fashion.  Foley catheter was placed sterilely.  SCDs were on her legs and operating.  She received Ancef 2 grams IV.  Time out was performed.  She was draped and tested with the Allis clamp to confirm  adequate anesthesia.  Abdomen was marked.  A Pfannenstiel skin incision was made and carried down to the underlying layer of the fascia with the Bovie.  Of note, there was very dense subcutaneous tissue.  The fascia was then extended laterally.   Then the rectus muscles were  separated at the midline and then the peritoneum was noted.  When we attempted to stretch the whole right rectus, it was felt adhered to the lower uterine segment.  I was able to feel around up over the fundus and feel where the attachment was.  I would  eventually cut through a series of peritoneum and adipose tissue with Kelly clamps and suture ligating with 0 Vicryl free ties.  When we got down to the uterus, prior to that, there were some dense adhesions on the lower uterine segment that seemed to  extend into the bladder.  I called for sterile milk for bladder irrigation to make sure that there was no injury to the bladder.  Once the bladder was well identified, the thick band of the uterine adhesion was grasped with Claiborne Billings x2, bovied and then  suture ligated with Heaney stitch.  Minimal bleeding was noted.  I was then able to free up the lower uterine segment and we were able to put in an Chiropodist.  There was a very large vessel on the left parametrium lower uterine segment.  I attempted to develop a bladder flap, but it was pretty flimsy.  I did a transverse incision on the uterus with the scalpel closer to the right side to avoid the vessels on  the left.  I extended it with the bandage scissors.    Clear fluid was noted.  I needed to grasp the Allis clamps to get down to the amnion.  The vacuum was called for.  The bale popped off and  then I called for the [facility] and the vacuum was placed on the posterior occiput and the head was delivered  atraumatically.  No nuchal cord noted.  Shoulders delivered easily and baby was placed on the field and suctioned.  One minute delayed cord clamping was performed.  Cord was clamped x2 and cut.  Baby handed off to the awaiting team.  She had a  spontaneous cry, very active on the surgical field.  The placenta was then manually removed and the hysterotomy incision was closed with 0 Vicryl in a continuous locked fashion.  The  uterus was then closed.  There was some oozing that did not respond to Bovie cautery so there were several places where the  adhesion was and I closed those with 2-0 Vicryl on an SH until hemostasis was noted and I did a figure-of-eight on the hysterotomy incision for hemostasis.  Arista was then applied to the anterior portion of the uterus after copious irrigation was performed.  Two sheets of Interceed were also applied due to adhesions.  The Alexis retractor was removed before the upper sheet of Interceed was applied.  Due to the extensive scarring.  There was no peritoneum to reapproximate.  The fascia was closed with 0 Vicryl in a continuous fashion.  Plain gut, 2-0, was used in the subcutaneous space and the skin was reapproximated with 4-0 Monocryl.  Prior to  closing the fascia, I used more Arista and bovied this for hemostasis and irrigation.  The patient will have a pressure dressing and Steri-Strips and benzoin on the skin.  All instrument, sponge and needle counts were correct x3.  The patient tolerated the procedure well.    The baby remained in the operating room after the completion of the surgery.  AN/NUANCE  D:04/06/2018 T:04/07/2018 JOB:[id number]

## 2018-04-07 NOTE — Progress Notes (Signed)
Caitlyn Roy [id number]   Per HPI: Caitlyn Roy is a 32 y.o. female G8 [id number] @ 37 4/7 weeks scheduled for repeat c-section on 04/06/18. PNC with [facility] Huh) has been complicated by anemia.  Pt has had c/o SOB and palpitations which she has had prior to pregnancy.  Pt got 2 iron infusions without resolution of symptoms per pt, but she denies SOB currently.  04/07/18 Postpartum Postoperative Day # 1  Alexis, [id number], [redacted]w[redacted]d, S/P Repeat LT Cesarean Section due to elective.   Subjective: Patient resting in bed with foley out, ambulating well denies syncope or dizziness. Reports consuming regular diet without issues and denies N/V. Patient reports 0 bowel movement + passing flatus.  Denies issues with urination and reports bleeding is "lighter."  Patient is Breastfeeding and reports going well.  Desires undecided for postpartum contraception.  Pain is being appropriately managed with po meds currently, although pt sittingin bed moving well with flat affect saying she is taking the po pain meds but it doesn't do anything for her, and she wants to go home. Pt is not grimacing in bed.  Pt reported having right clavicle pain last night which resolved with IV dilaudid. Pt stated it hurts again now, but does not hurt with movement, pt endores pain is worse when she breaths in and lies on her left side, denies pain to touch. Denies cp or sob, no n, v, d, rashes or fevers. Pt denies ever feeling this pain before with previous cs. Pt denies numbness or tingles and endorses full range of motion with right extremity.  Post op HGB was9.1 down from 10.2, pt denies anemia s/sx.   Objective: Patient Vitals for the past 24 hrs:  BP Temp Temp src Pulse Resp SpO2 Height Weight  04/07/18 0655 (!) 124/56 98.2 F (36.8 C) Oral 100 18 100 % - -  04/07/18 0205 (!) 112/51 98.4 F (36.9 C) Oral 74 20 100 % - -  04/07/18 0124 - - - - (!) 22 - - -  04/07/18 0018 (!) 107/57 - - 78 20 100 % - -   04/06/18 2200 (!) 113/58 98.8 F (37.1 C) Oral 78 20 99 % - -  04/06/18 1945 - - - - - 100 % - -  04/06/18 1756 112/68 98.1 F (36.7 C) Oral 91 18 100 % - -  04/06/18 1715 114/63 98.5 F (36.9 C) Oral 74 18 97 % - -  04/06/18 1604 130/66 98.8 F (37.1 C) Oral 80 18 98 % - -  04/06/18 1453 111/63 99.5 F (37.5 C) Oral (!) 106 20 98 % - -  04/06/18 1445 - - - (!) 110 20 97 % - -  04/06/18 1438 - 98.4 F (36.9 C) Oral 96 (!) 23 98 % - -  04/06/18 1430 (!) 118/53 - - (!) 114 17 97 % - -  04/06/18 1424 - - - 93 19 100 % - -  04/06/18 1415 112/66 - - 92 16 99 % - -  04/06/18 1406 - - - 75 20 98 % - -  04/06/18 1400 (!) 116/55 98 F (36.7 C) Axillary 90 19 98 % - -  04/06/18 1345 119/69 - - 91 17 98 % - -  04/06/18 1343 - - - 88 14 97 % - -  04/06/18 1329 103/88 97.9 F (36.6 C) Oral 93 16 97 % - -  04/06/18 0959 118/74 98.3 F (36.8 C) Oral (!) 106 18 - 5\' 2"  (1.575  m) 85.9 kg     Physical Exam:  General: alert, cooperative and appears stated age Mood/Affect: Flat Lungs: clear to auscultation, no wheezes, rales or rhonchi, symmetric air entry.  Heart: normal rate, regular rhythm, normal S1, S2, no murmurs, rubs, clicks or gallops. Breast: breasts appear normal, no suspicious masses, no skin or nipple changes or axillary nodes. Abdomen:  + bowel sounds, soft, non-tender Incision: healing well, no significant drainage, no dehiscence, no significant erythema, Honeycomb dressing  Uterine Fundus: firm, involution -2 Lochia: appropriate Skin: Warm, Dry. DVT Evaluation: No evidence of DVT seen on physical exam. Negative Homan's sign. No cords or calf tenderness. No significant calf/ankle edema. Musculoskeletal: 2+ reflexes, equal strengths in both upper extremities. No tender on right clavicle to palpated. Full range of motion equally in in both upper extremities.   Labs: Recent Labs    04/06/18 1530 04/07/18 0555  HGB 10.2* 9.1*  HCT 31.9* 27.2*  WBC 11.9* 10.0    CBG  (last 3)  No results for input(s): GLUCAP in the last 72 hours.   I/O: I/O last 3 completed shifts: In: 3947.5 [P.O.:360; I.V.:3587.5] Out: 2070 [Urine:1850; Blood:220]   Assessment Postpartum Postoperative Day # 1. [place], [id number], [redacted]w[redacted]d, S/P Repeat LT Cesarean Section due to elective with antenatal anemia s/p x2 iron transfusion with no improvment  Pt stable. -2 Involution. Bottle Feeding. Hemodynamically Stable. Pre-op HGB was 10.2, post-op HGB was 9.1, denies s/sx. Right shoulder pain improved with IV dilaudid, my impression is a pinched nerve, mild strain of the clavicular musculature, or trapped air from surgery, abdomen soft and non tender. Pt anticipates early discharge tomorrow morning.   Plan: Continue other mgmt as ordered Plan to d/c foley today. Anemia: Iron PO BID start today. Right shoulder pain: Continue to monitor, if dizzy will order morning CBC to rule out abdominal bleed.  VTE Prophylactics: SCD, ambulated as tolerates, proph lovenox SQ 40mg  daily.  Pain control: Motrin/Tylenol/Narcotics PRN Education given regarding options for contraception, including barrier methods, injectable contraception, IUD placement, oral contraceptives.  Plan for discharge tomorrow and Contraception still undecided   Dr. Alwyn Pea to be updated on patient status @ 1100.  Kymoni Monday NP-C, CNM 04/07/2018, 7:50 AM

## 2018-04-07 NOTE — Progress Notes (Signed)
When this RN offered patient noon pain medications of Tylenol and Motrin, patient refused both medications and seemed agitated with this RN for offering these medications (as evidenced by scowling at RN). This RN agrees with MD note that "pain is being appropriately managed with po meds currently, although pt sitting in bed moving well with flat affect saying she is taking the po pain meds but it doesn't do anything for her, and she wants to go home". Pt still is not grimacing, is able to move in bed and ambulate independently, and was sleeping when this RN came into room to give her po pain medications. This RN will continue to monitor pain management and continue to offer medications and other comfort techniques as appropriate.

## 2018-04-07 NOTE — Addendum Note (Signed)
Addendum  created 04/07/18 4446 by Rayvon Char, CRNA   Sign clinical note

## 2018-04-07 NOTE — Progress Notes (Signed)
Patient had been sleeping through multiple hourly checks (last being at 1400). However, after this RN was performing 3-11pm assessment (patient was on phone during baby assessment), patient called this RN back into room. Patient stated that she had called for pain medication, which this RN brought within a minute or two. However, patient was very upset because she had said that she had called prior to baby assessment (this RN was not told this). Patient also upset because she had asked for formula, but it was put on the counter near the sink instead of handed to her.

## 2018-04-07 NOTE — Anesthesia Postprocedure Evaluation (Signed)
Anesthesia Post Note  Patient: Mailee Klaas  Procedure(s) Performed: CESAREAN SECTION bladder irrigation (N/A )     Patient location during evaluation: Mother Baby Anesthesia Type: Spinal Level of consciousness: oriented and awake and alert Pain management: pain level controlled Vital Signs Assessment: post-procedure vital signs reviewed and stable Respiratory status: spontaneous breathing, respiratory function stable and patient connected to nasal cannula oxygen Cardiovascular status: blood pressure returned to baseline and stable Postop Assessment: no headache, no backache and no apparent nausea or vomiting Anesthetic complications: no    Last Vitals:  Vitals:   04/07/18 0205 04/07/18 0655  BP: (!) 112/51 (!) 124/56  Pulse: 74 100  Resp: 20 18  Temp: 36.9 C 36.8 C  SpO2: 100% 100%    Last Pain:  Vitals:   04/07/18 0655  TempSrc: Oral  PainSc: 0-No pain   Pain Goal: Patients Stated Pain Goal: 4 (04/06/18 1759)               Rayvon Char

## 2018-04-07 NOTE — Progress Notes (Signed)
Patient called out at [place] complaining of 6/10 pain under her right clavicle. Pt states that the pain woke her from sleep. Pt described the pain as dull when resting but sharp when she takes a deep breath. Patient's vital signs remain WNL. Respirations are clear with equal chest expansion. Pt complains of no difficulty breathing and states that this pain is not like the pain that she experienced with previous angina episodes. At [place], CNM was called and notified of the change in patient's condition. Midwife requested that patient receive 2mg  of dilaudid. Midwife asked to be called back if patient condition worsens or if patient is still experiencing sharp pain after receiving medication. Will continue to monitor.

## 2018-04-07 NOTE — Progress Notes (Signed)
MOB was referred for history of depression/anxiety. * Referral screened out by Clinical Social Worker because none of the following criteria appear to apply: ~ History of anxiety/depression during this pregnancy, or of post-partum depression following prior delivery. ~ Diagnosis of anxiety and/or depression within last 3 years OR * MOB's symptoms currently being treated with medication and/or therapy. Please contact the Clinical Social Worker if needs arise, by MOB request, or if MOB scores greater than 9/yes to question 10 on Edinburgh Postpartum Depression Screen.  Kalia Vahey, LCSW Clinical Social Worker  [facility]  [phone]  

## 2018-04-08 LAB — RH IG WORKUP (INCLUDES ABO/RH)
ABO/RH(D): O NEG
Antibody Screen: NEGATIVE
Fetal Screen: NEGATIVE
Gestational Age(Wks): 39.2
Unit division: 0

## 2018-04-08 MED ORDER — KETOROLAC TROMETHAMINE 10 MG PO TABS
10.0000 mg | ORAL_TABLET | Freq: Four times a day (QID) | ORAL | Status: DC | PRN
  Administered 2018-04-08: 10 mg via ORAL
  Filled 2018-04-08 (×2): qty 1

## 2018-04-08 MED ORDER — TRAMADOL HCL 50 MG PO TABS
50.0000 mg | ORAL_TABLET | Freq: Four times a day (QID) | ORAL | Status: DC
  Administered 2018-04-08 (×2): 50 mg via ORAL
  Filled 2018-04-08 (×2): qty 1

## 2018-04-08 MED ORDER — KETOROLAC TROMETHAMINE 10 MG PO TABS: 10.0000 mg | ORAL_TABLET | Freq: Four times a day (QID) | ORAL | 0 refills | Status: DC | PRN

## 2018-04-08 MED ORDER — FLUCONAZOLE 150 MG PO TABS
150.0000 mg | ORAL_TABLET | Freq: Once | ORAL | Status: AC
  Administered 2018-04-08: 150 mg via ORAL
  Filled 2018-04-08: qty 1

## 2018-04-08 MED ORDER — TRAMADOL HCL 50 MG PO TABS: 50.0000 mg | ORAL_TABLET | Freq: Four times a day (QID) | ORAL | 0 refills | Status: DC | PRN

## 2018-04-08 NOTE — Discharge Summary (Addendum)
OB Discharge Summary     Patient Name: Caitlyn Roy DOB: January 04, 1986 MRN: [id number]  Date of admission: 04/06/2018 Delivering MD: [name]   Date of discharge: 04/08/2018  Admitting diagnosis: Z98.891 H O cesarean section Intrauterine pregnancy: [redacted]w[redacted]d     Secondary diagnosis:  Active Problems:   S/P repeat low transverse C-section   H/O: cesarean section Rh Negative  Additional problems: Dense adhesions     Discharge diagnosis: Term Pregnancy Delivered                                                                                                Post partum procedures:  Rhogam  Augmentation: N/a  Complications: None  Hospital course:  Sceduled C/S   32 y.o. yo [id number] at [redacted]w[redacted]d was admitted to the hospital 04/06/2018 for scheduled cesarean section with the following indication:Elective Repeat.  Membrane Rupture Time/Date: 12:20 PM ,04/06/2018   Patient delivered a Viable infant.04/06/2018  Details of operation can be found in separate operative note.  Pateint had an uncomplicated postpartum course.  She is ambulating, tolerating a regular diet, passing flatus, and urinating well. Patient is discharged home in stable condition on  04/08/18   Pt with poor pain control on POD #0 and #1.  Pt has an allergy to Oxycodone so she was given Dilaudid 2 mg po and Ibuprofen.  Meds changed to Toradol and ULtram and pt felt that worked better.         Physical exam  Vitals:   04/07/18 0920 04/07/18 1150 04/07/18 2324 04/08/18 0637  BP:  108/67 112/68 117/64  Pulse:  70 69 92  Resp:  16 18   Temp:  98.3 F (36.8 C) 98.7 F (37.1 C) 98.5 F (36.9 C)  TempSrc:  Oral Oral Oral  SpO2: 99% 98%  99%  Weight:      Height:       General: alert, cooperative and no distress Lochia: appropriate Uterine Fundus: firm Incision: Dressing is clean, dry, and intact DVT Evaluation: No evidence of DVT seen on physical exam. Calf/Ankle edema is present Labs: Lab Results   Component Value Date   WBC 10.0 04/07/2018   HGB 9.1 (L) 04/07/2018   HCT 27.2 (L) 04/07/2018   MCV 87.5 04/07/2018   PLT 227 04/07/2018   CMP Latest Ref Rng & Units 04/06/2018  Glucose 70 - 99 mg/dL -  BUN 6 - 20 mg/dL -  Creatinine 0.44 - 1.00 mg/dL 0.54  Sodium 135 - 145 mmol/L -  Potassium 3.5 - 5.1 mmol/L -  Chloride 98 - 111 mmol/L -  CO2 22 - 32 mmol/L -  Calcium 8.9 - 10.3 mg/dL -  Total Protein 6.5 - 8.1 g/dL -  Total Bilirubin 0.3 - 1.2 mg/dL -  Alkaline Phos 38 - 126 U/L -  AST 15 - 41 U/L -  ALT 14 - 54 U/L -    Discharge instruction: per After Visit Summary and "Baby and Me Booklet".  After visit meds:  Allergies as of 04/08/2018      Reactions   Oxycodone Shortness Of Breath, Itching  Medication List    TAKE these medications   CONCEPT DHA 53.5-38-1 MG Caps Take 1 capsule by mouth daily.   famotidine 20 MG tablet Commonly known as:  PEPCID Take 1 tablet (20 mg total) by mouth daily.   INTEGRA 62.5-62.5-40-3 MG Caps Take 1 capsule by mouth every evening.   ketorolac 10 MG tablet Commonly known as:  TORADOL Take 1 tablet (10 mg total) by mouth every 6 (six) hours as needed for moderate pain or severe pain.   traMADol 50 MG tablet Commonly known as:  ULTRAM Take 1 tablet (50 mg total) by mouth every 6 (six) hours as needed for moderate pain or severe pain.       Diet: routine diet  Activity: Advance as tolerated. Pelvic rest for 6 weeks.   Outpatient follow up:2 weeks Follow up Appt:No future appointments. Follow up Visit:No follow-ups on file.  Postpartum contraception: Progesterone only pills  Newborn Data: Live born female  Birth Weight: 7 lb 0.7 oz (3195 g) APGAR: 8, 9  Newborn Delivery   Birth date/time:  04/06/2018 12:21:00 Delivery type:  C-Section, Low Transverse Trial of labor:  No C-section categorization:  Repeat     Baby Feeding: Bottle and Breast Disposition:home with mother   04/08/2018 [name],  MD

## 2018-04-08 NOTE — Progress Notes (Signed)
Patient requested that Rn doesn't visit the room unless she calls out for assistance. Patient given pain medication after rating pain 7 out of 10.

## 2018-04-08 NOTE — Progress Notes (Signed)
Pt has ambulated in the hall around the unit. She reports improved pain management. Current pain level is 5 after ambulating. Toradol 10mg  PO is given. Pt removed the Pressure  dressing in the shower. LTCS  Honeycomb dressing is clean dry and intact-steri strips are intact and incision margins are approximated.

## 2018-04-08 NOTE — Discharge Instructions (Signed)

## 2018-04-17 ENCOUNTER — Inpatient Hospital Stay (HOSPITAL_COMMUNITY)
Admission: AD | Admit: 2018-04-17 | Discharge: 2018-04-17 | Discharge status: Against Medical Advice | Payer: 59 | Attending: Obstetrics and Gynecology | Admitting: Obstetrics and Gynecology

## 2018-04-17 ENCOUNTER — Other Ambulatory Visit: Payer: Self-pay

## 2018-04-17 ENCOUNTER — Encounter (HOSPITAL_COMMUNITY): Payer: Self-pay | Admitting: *Deleted

## 2018-04-17 DIAGNOSIS — R51 Headache: Secondary | ICD-10-CM

## 2018-04-17 DIAGNOSIS — Z5329 Procedure and treatment not carried out because of patient's decision for other reasons: Secondary | ICD-10-CM | POA: Diagnosis not present

## 2018-04-17 DIAGNOSIS — Z98891 History of uterine scar from previous surgery: Secondary | ICD-10-CM

## 2018-04-17 DIAGNOSIS — R030 Elevated blood-pressure reading, without diagnosis of hypertension: Secondary | ICD-10-CM | POA: Insufficient documentation

## 2018-04-17 DIAGNOSIS — R519 Headache, unspecified: Secondary | ICD-10-CM

## 2018-04-17 LAB — URINALYSIS, ROUTINE W REFLEX MICROSCOPIC
Bilirubin Urine: NEGATIVE
Glucose, UA: NEGATIVE mg/dL
Ketones, ur: NEGATIVE mg/dL
Leukocytes, UA: NEGATIVE
Nitrite: NEGATIVE
Protein, ur: 100 mg/dL — AB
Specific Gravity, Urine: 1.031 — ABNORMAL HIGH (ref 1.005–1.030)
pH: 5 (ref 5.0–8.0)

## 2018-04-17 NOTE — MAU Note (Signed)
Pt presents to MAU with complaints of headache on and off since Thursday with episodes of blurred vision.

## 2018-04-17 NOTE — MAU Note (Addendum)
Pt called out stating that her BPs were normal and she was ready to leave. Pt took BP cuff off arm stating she would like to sign AMA form. RN talked to patient stating she would have the provider come and talk to the patient but patient states she does not want to wait around. Pt signed AMA form.

## 2018-04-17 NOTE — MAU Provider Note (Signed)
[id number] who is 1.5 weeks s/p elective RLTCS presents today for headache and report of elevated BP.  She was triaged and waited in lobby for available room.  After being placed in a room and provider was notified she was ready, pt wanted to leave AMA. She had normal blood pressures on arrival so wanted to leave.   I presented to the room after pt dressed and ready to leave.  I offered to evaluate the pt for headaches and treat her.  I offered to treat her with prescriptions so she did not have to stay long in MAU.  Pt declined evaluation and treatment and preferred to leave AMA.    Pt well-appearing and stable walking out of MAU at 8:20 pm.  Patient Vitals for the past 24 hrs:  BP Temp Pulse Resp Height Weight  04/17/18 2016 121/66 - 99 - - -  04/17/18 2000 122/74 - (!) 103 - - -  04/17/18 1946 127/77 - (!) 107 - - -  04/17/18 1935 133/75 - (!) 102 - - -  04/17/18 1921 129/77 98.8 F (37.1 C) (!) 102 16 5\' 2"  (1.575 m) 78.5 kg    Fatima Blank, CNM 8:29 PM

## 2018-05-02 ENCOUNTER — Inpatient Hospital Stay (HOSPITAL_COMMUNITY)
Admission: AD | Admit: 2018-05-02 | Discharge: 2018-05-02 | Disposition: A | Discharge status: Home / Self Care | Payer: 59 | Source: Ambulatory Visit | Attending: Obstetrics & Gynecology | Admitting: Obstetrics & Gynecology

## 2018-05-06 IMAGING — DX DG CHEST 2V
2 series · 2 of 2 positions shown · non-contrast
Comparison: Chest radiograph dated 06/01/2016

CLINICAL DATA: 30-year-old female with shortness of breath.

EXAM:
CHEST  2 VIEW

[chest pa]
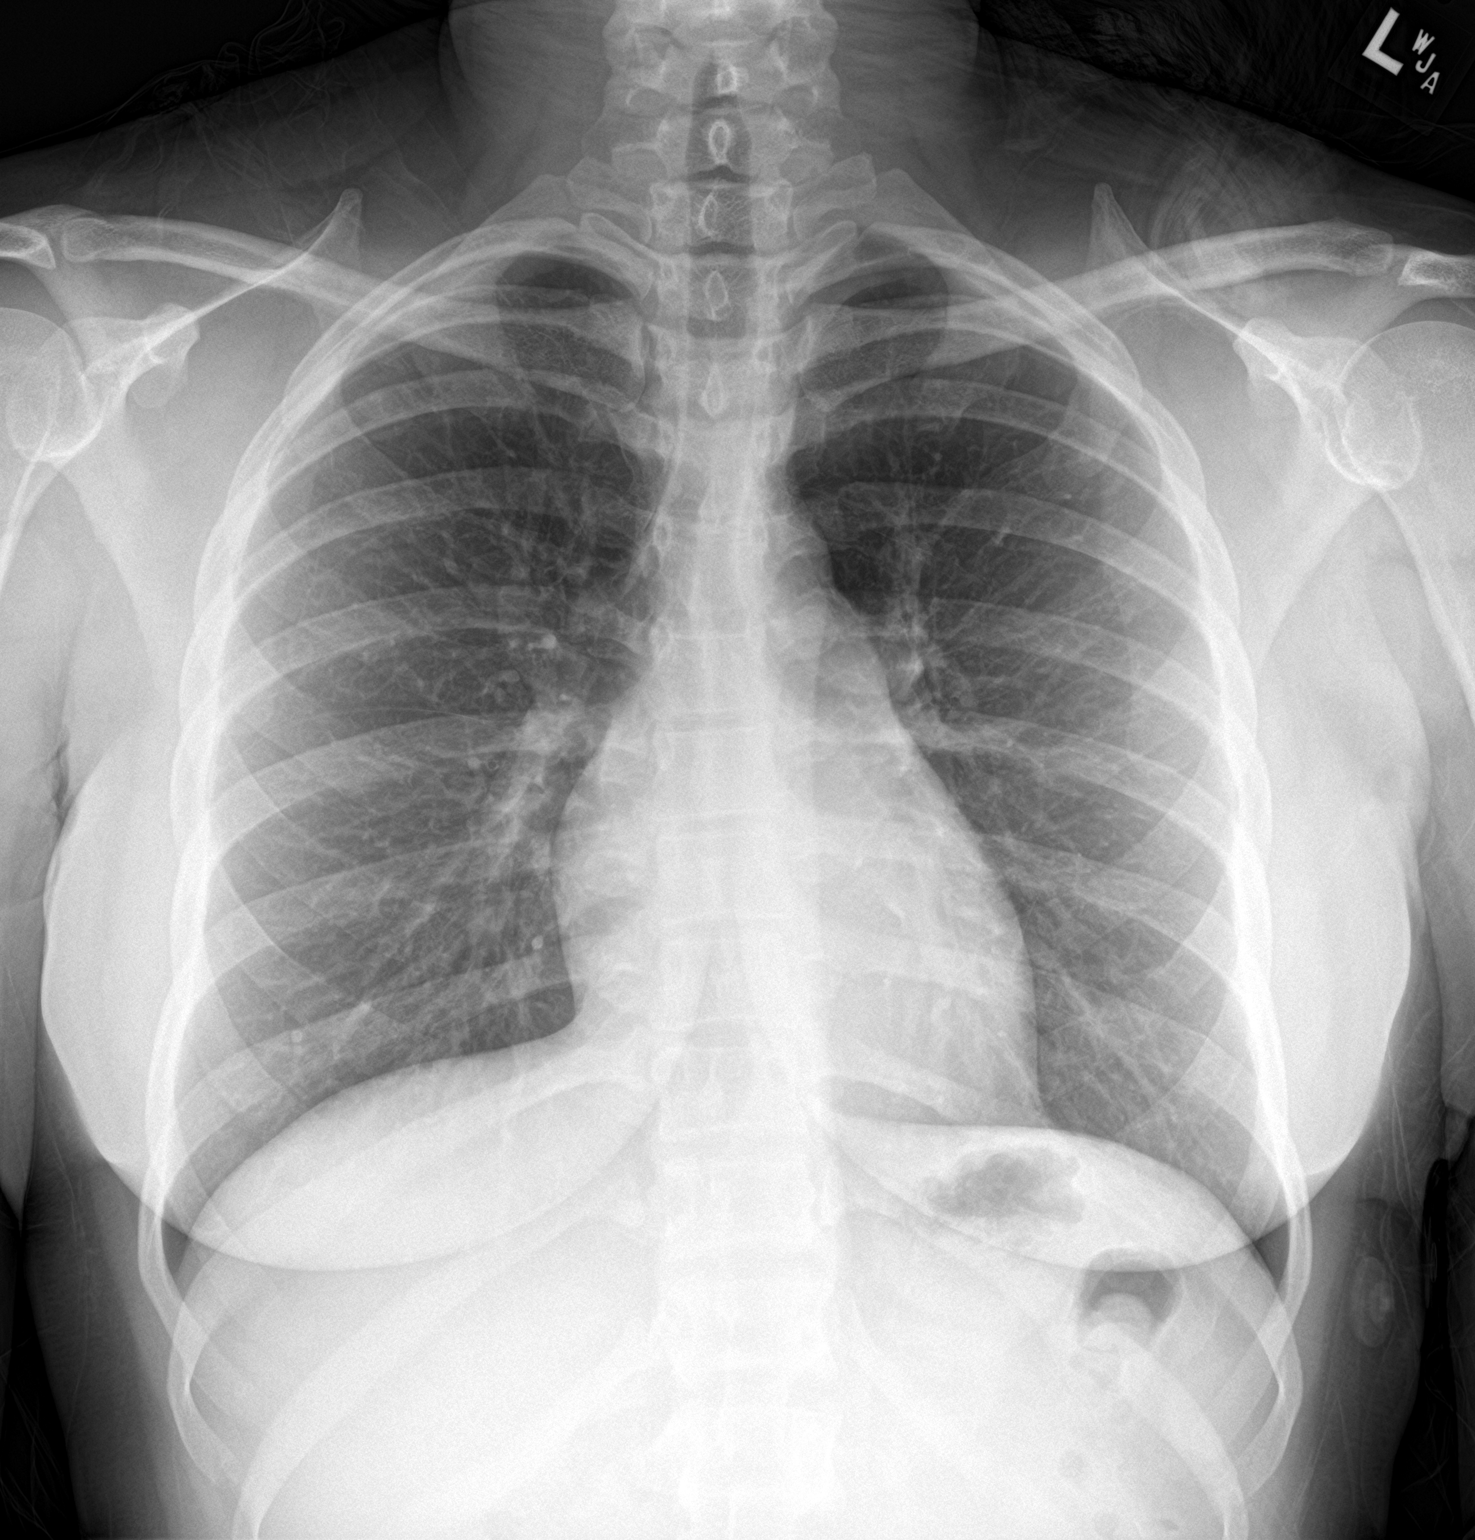

[chest lat]
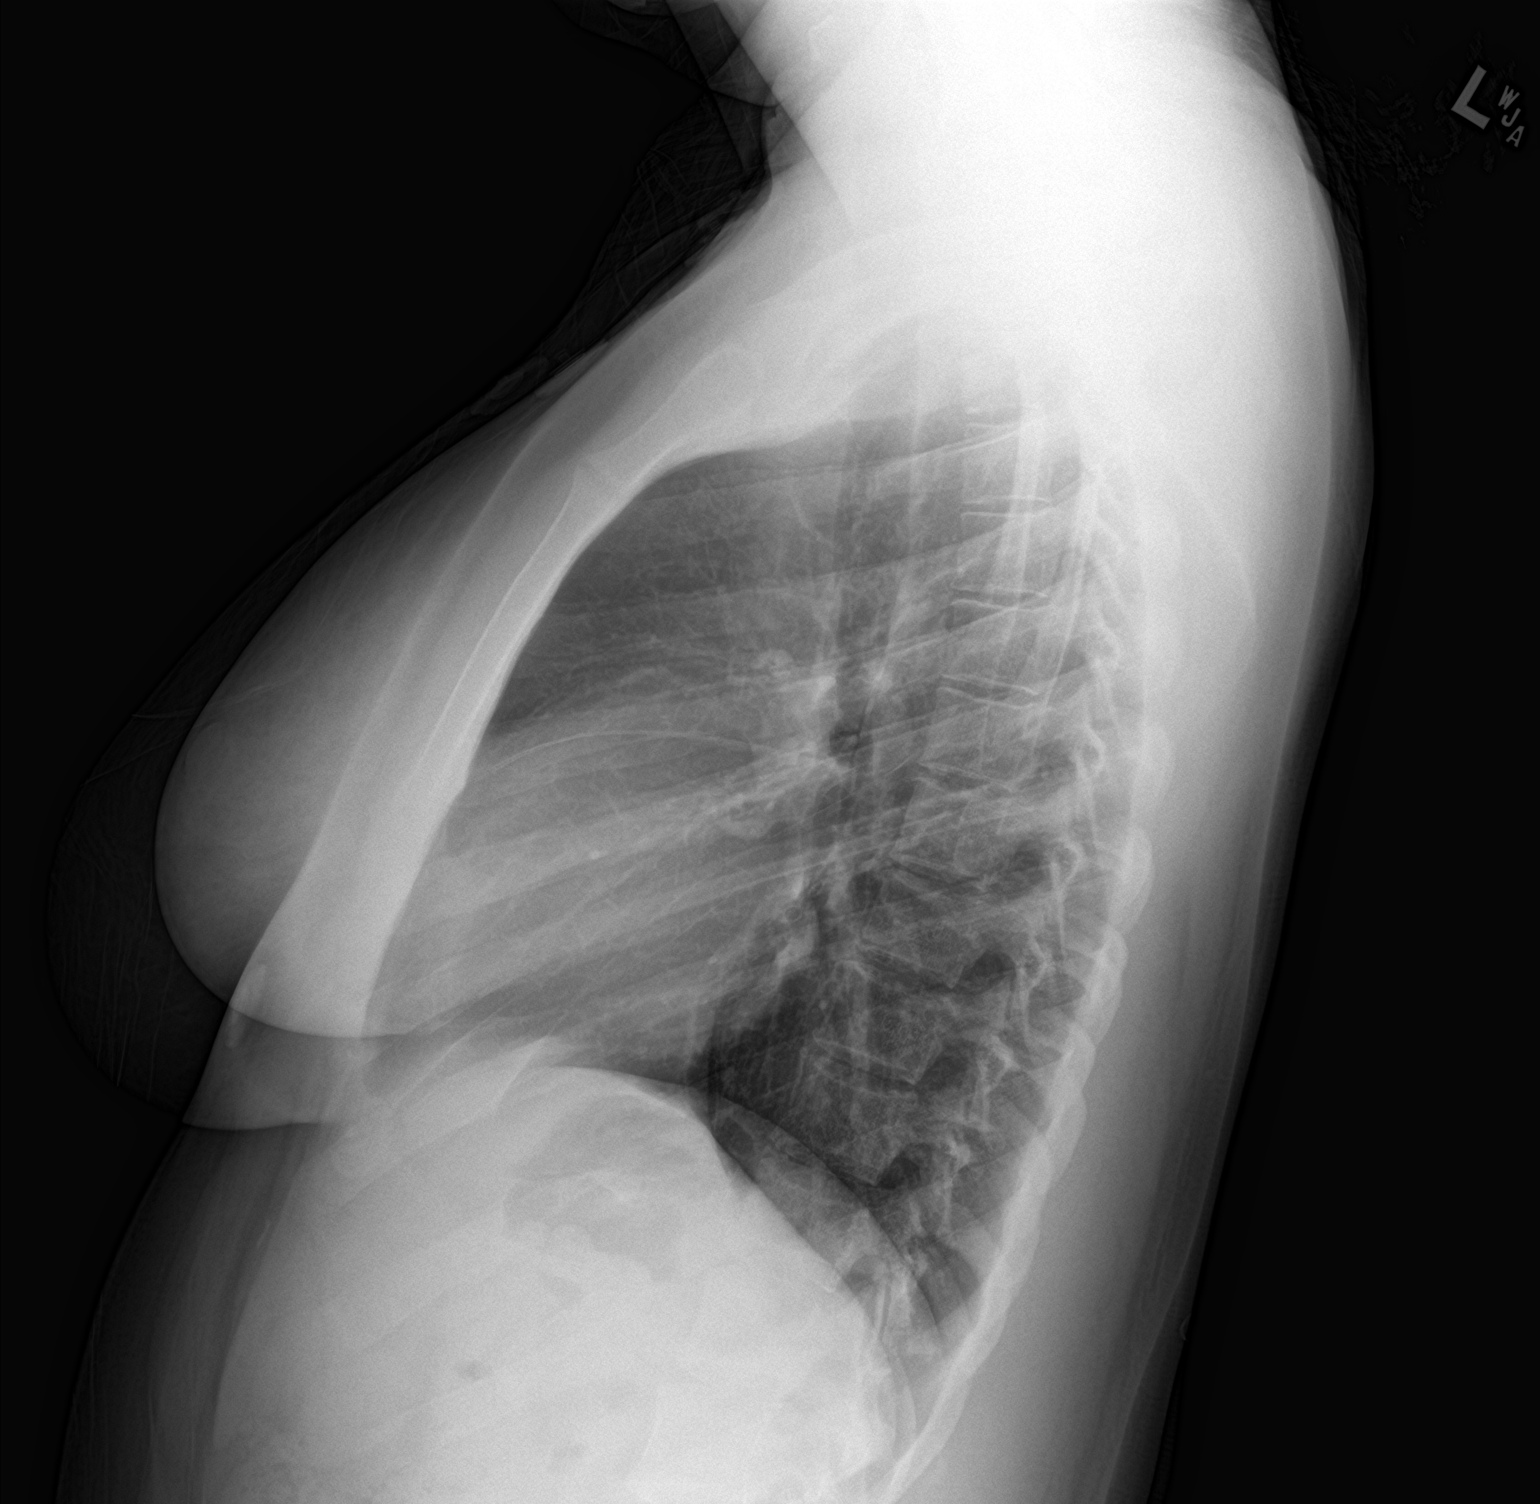

[2 of 2 positions shown; findings below may reference images not displayed]

FINDINGS: The heart size and mediastinal contours are within normal limits.
Both lungs are clear. The visualized skeletal structures are
unremarkable.
IMPRESSION: No active cardiopulmonary disease.

## 2018-06-01 IMAGING — CR DG CHEST 2V
2 series · 2 of 2 positions shown · non-contrast
Comparison: 07/07/2016

CLINICAL DATA: Dull left-sided chest pain with dyspnea.

EXAM:
CHEST  2 VIEW

[w chest pa]
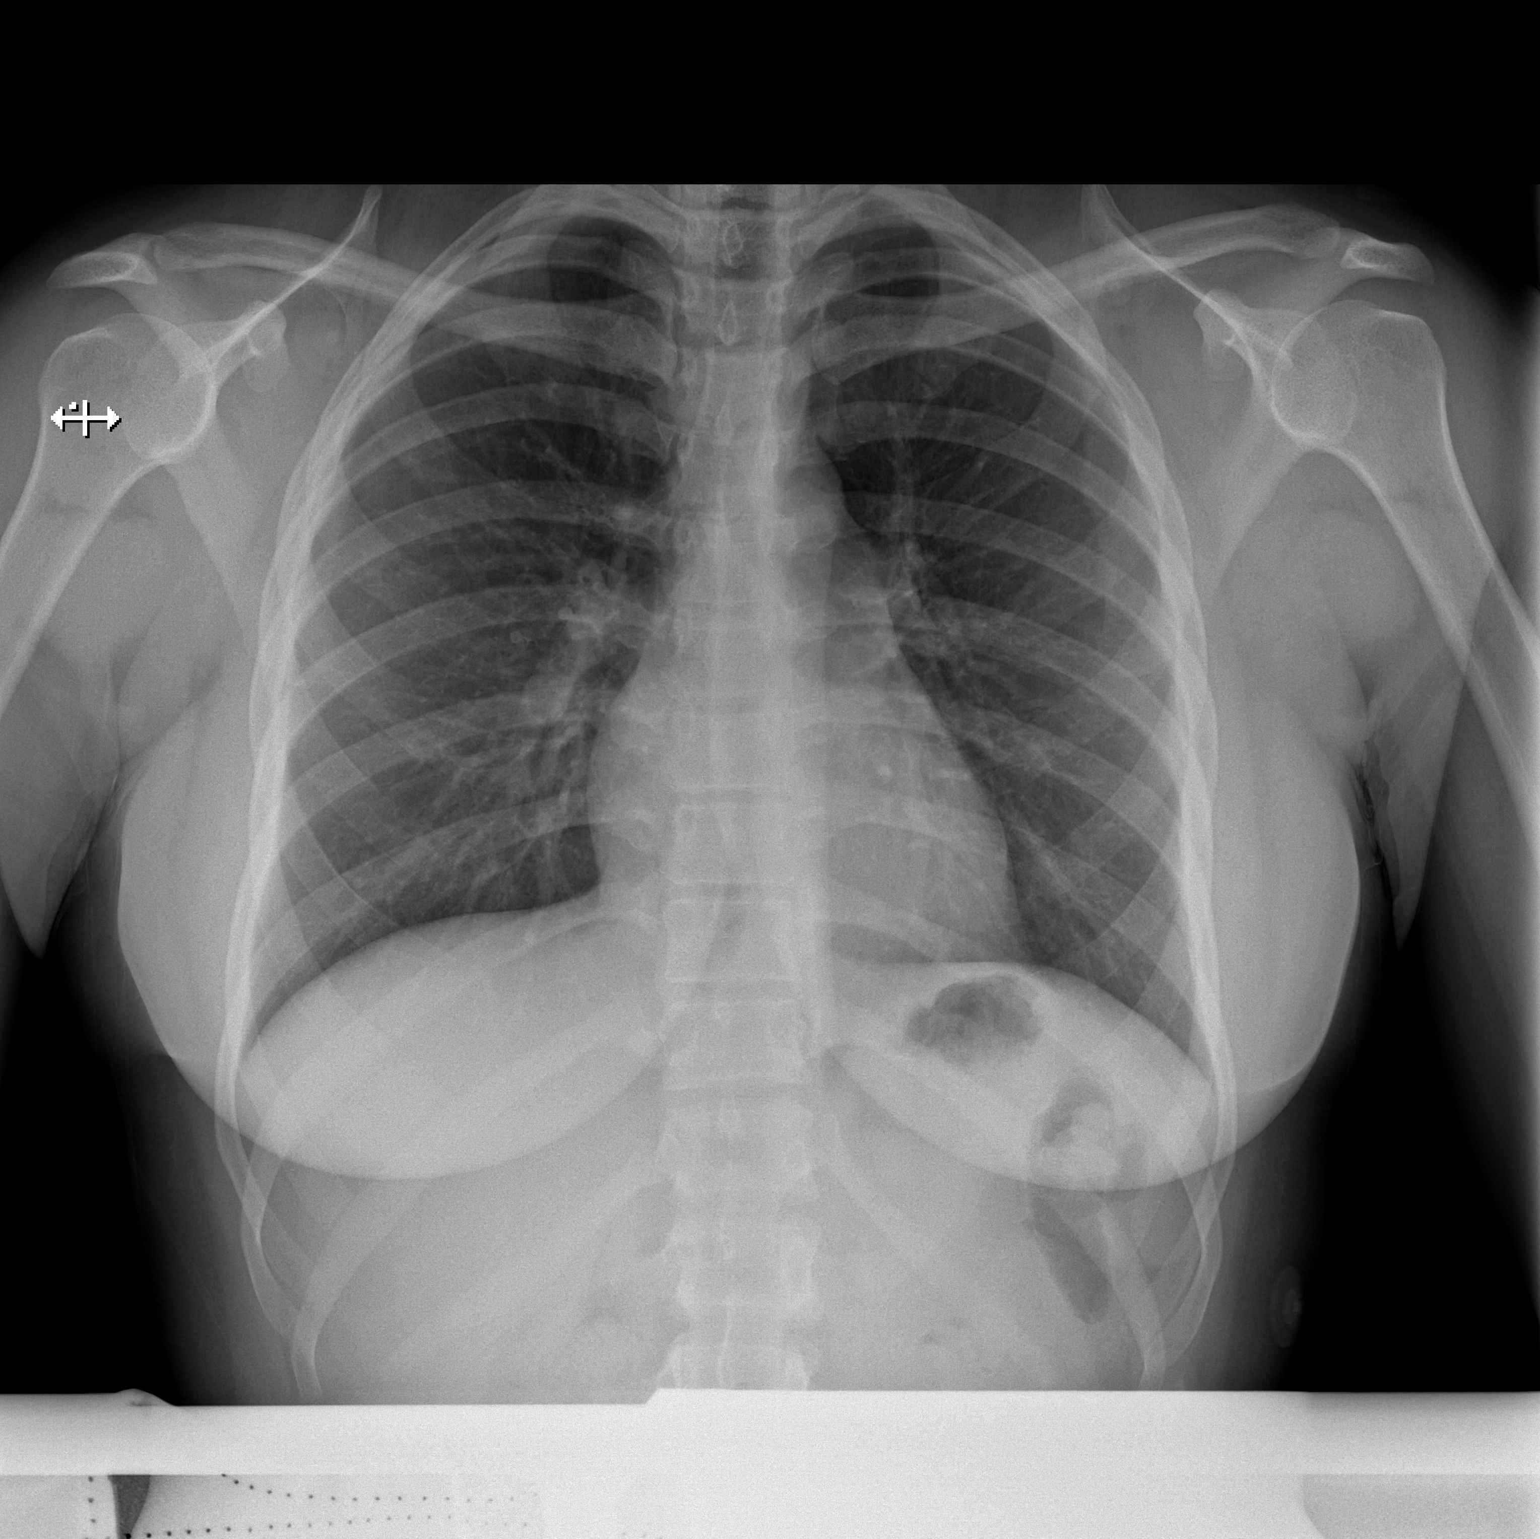

[w chest lat]
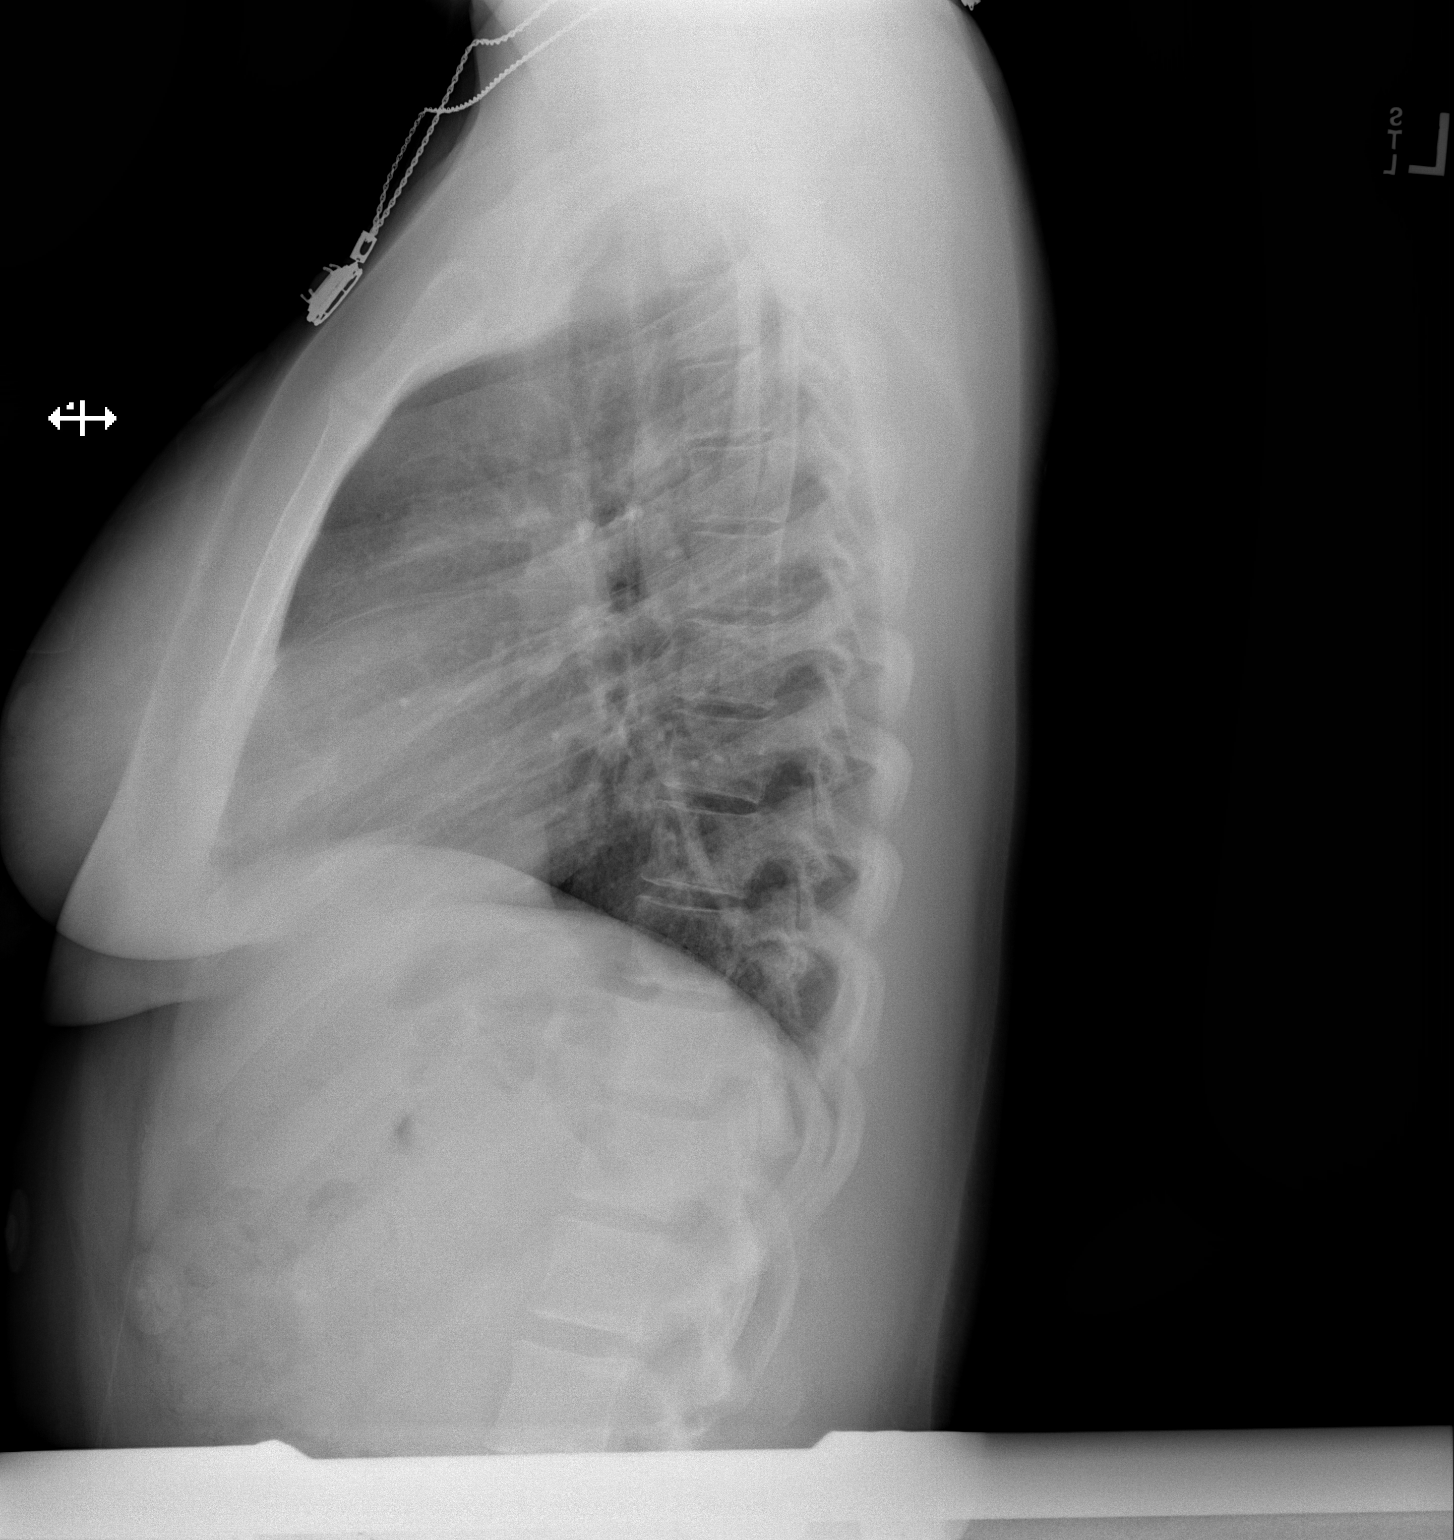

[2 of 2 positions shown; findings below may reference images not displayed]

FINDINGS: The heart size and mediastinal contours are within normal limits.
Both lungs are clear. The visualized skeletal structures are
unremarkable.
IMPRESSION: No active cardiopulmonary disease.

## 2018-08-07 ENCOUNTER — Inpatient Hospital Stay (HOSPITAL_COMMUNITY): Payer: 59 | Admitting: Certified Registered Nurse Anesthetist

## 2018-08-07 ENCOUNTER — Ambulatory Visit (HOSPITAL_COMMUNITY)
Admission: AD | Admit: 2018-08-07 | Discharge: 2018-08-07 | Disposition: A | Discharge status: Home / Self Care | Payer: 59 | Attending: Obstetrics and Gynecology | Admitting: Obstetrics and Gynecology

## 2018-08-07 ENCOUNTER — Inpatient Hospital Stay (HOSPITAL_COMMUNITY): Payer: 59

## 2018-08-07 ENCOUNTER — Other Ambulatory Visit: Payer: Self-pay

## 2018-08-07 ENCOUNTER — Encounter (HOSPITAL_COMMUNITY): Payer: Self-pay | Admitting: *Deleted

## 2018-08-07 ENCOUNTER — Encounter (HOSPITAL_COMMUNITY)
Admission: AD | Disposition: A | Discharge status: Home / Self Care | Payer: Self-pay | Source: Home / Self Care | Attending: Obstetrics and Gynecology

## 2018-08-07 DIAGNOSIS — Z640 Problems related to unwanted pregnancy: Secondary | ICD-10-CM | POA: Diagnosis not present

## 2018-08-07 DIAGNOSIS — R109 Unspecified abdominal pain: Secondary | ICD-10-CM

## 2018-08-07 DIAGNOSIS — N736 Female pelvic peritoneal adhesions (postinfective): Secondary | ICD-10-CM | POA: Diagnosis not present

## 2018-08-07 DIAGNOSIS — O99211 Obesity complicating pregnancy, first trimester: Secondary | ICD-10-CM | POA: Insufficient documentation

## 2018-08-07 DIAGNOSIS — O9989 Other specified diseases and conditions complicating pregnancy, childbirth and the puerperium: Secondary | ICD-10-CM | POA: Diagnosis not present

## 2018-08-07 DIAGNOSIS — Z885 Allergy status to narcotic agent status: Secondary | ICD-10-CM | POA: Diagnosis not present

## 2018-08-07 DIAGNOSIS — O99011 Anemia complicating pregnancy, first trimester: Secondary | ICD-10-CM | POA: Diagnosis not present

## 2018-08-07 DIAGNOSIS — Z3A01 Less than 8 weeks gestation of pregnancy: Secondary | ICD-10-CM | POA: Insufficient documentation

## 2018-08-07 DIAGNOSIS — Z79899 Other long term (current) drug therapy: Secondary | ICD-10-CM | POA: Diagnosis not present

## 2018-08-07 DIAGNOSIS — O26899 Other specified pregnancy related conditions, unspecified trimester: Secondary | ICD-10-CM

## 2018-08-07 DIAGNOSIS — D649 Anemia, unspecified: Secondary | ICD-10-CM | POA: Diagnosis not present

## 2018-08-07 DIAGNOSIS — O26891 Other specified pregnancy related conditions, first trimester: Secondary | ICD-10-CM

## 2018-08-07 DIAGNOSIS — O019 Hydatidiform mole, unspecified: Secondary | ICD-10-CM | POA: Insufficient documentation

## 2018-08-07 DIAGNOSIS — O00102 Left tubal pregnancy without intrauterine pregnancy: Secondary | ICD-10-CM | POA: Insufficient documentation

## 2018-08-07 DIAGNOSIS — O009 Unspecified ectopic pregnancy without intrauterine pregnancy: Secondary | ICD-10-CM

## 2018-08-07 HISTORY — PX: DIAGNOSTIC LAPAROSCOPY WITH REMOVAL OF ECTOPIC PREGNANCY: SHX6449

## 2018-08-07 HISTORY — PX: LAPAROSCOPIC SALPINGO OOPHERECTOMY: SHX5927

## 2018-08-07 LAB — TYPE AND SCREEN
ABO/RH(D): O NEG
Antibody Screen: NEGATIVE

## 2018-08-07 LAB — WET PREP, GENITAL
Sperm: NONE SEEN
Trich, Wet Prep: NONE SEEN
Yeast Wet Prep HPF POC: NONE SEEN

## 2018-08-07 LAB — CBC
HCT: 36 % (ref 36.0–46.0)
Hemoglobin: 11.6 g/dL — ABNORMAL LOW (ref 12.0–15.0)
MCH: 28 pg (ref 26.0–34.0)
MCHC: 32.2 g/dL (ref 30.0–36.0)
MCV: 86.7 fL (ref 80.0–100.0)
Platelets: 385 10*3/uL (ref 150–400)
RBC: 4.15 MIL/uL (ref 3.87–5.11)
RDW: 13.2 % (ref 11.5–15.5)
WBC: 6 10*3/uL (ref 4.0–10.5)
nRBC: 0 % (ref 0.0–0.2)

## 2018-08-07 LAB — HCG, QUANTITATIVE, PREGNANCY: hCG, Beta Chain, Quant, S: 6924 m[IU]/mL — ABNORMAL HIGH (ref <5)

## 2018-08-07 LAB — POCT PREGNANCY, URINE: Preg Test, Ur: POSITIVE — AB

## 2018-08-07 SURGERY — LAPAROSCOPY, WITH ECTOPIC PREGNANCY SURGICAL TREATMENT
Anesthesia: General | Site: Abdomen

## 2018-08-07 MED ORDER — ROCURONIUM BROMIDE 100 MG/10ML IV SOLN
INTRAVENOUS | Status: DC | PRN
  Administered 2018-08-07: 30 mg via INTRAVENOUS

## 2018-08-07 MED ORDER — KETOROLAC TROMETHAMINE 30 MG/ML IJ SOLN
INTRAMUSCULAR | Status: DC | PRN
  Administered 2018-08-07: 30 mg via INTRAVENOUS

## 2018-08-07 MED ORDER — SUCCINYLCHOLINE CHLORIDE 20 MG/ML IJ SOLN
INTRAMUSCULAR | Status: DC | PRN
  Administered 2018-08-07: 100 mg via INTRAVENOUS

## 2018-08-07 MED ORDER — FENTANYL CITRATE (PF) 100 MCG/2ML IJ SOLN
INTRAMUSCULAR | Status: AC
  Filled 2018-08-07: qty 2

## 2018-08-07 MED ORDER — LACTATED RINGERS IV BOLUS
1000.0000 mL | Freq: Once | INTRAVENOUS | Status: AC
  Administered 2018-08-07: 19:00:00 1000 mL via INTRAVENOUS

## 2018-08-07 MED ORDER — FENTANYL CITRATE (PF) 100 MCG/2ML IJ SOLN
INTRAMUSCULAR | Status: DC | PRN
  Administered 2018-08-07: 100 ug via INTRAVENOUS
  Administered 2018-08-07 (×3): 50 ug via INTRAVENOUS

## 2018-08-07 MED ORDER — MIDAZOLAM HCL 2 MG/2ML IJ SOLN
INTRAMUSCULAR | Status: AC
  Filled 2018-08-07: qty 2

## 2018-08-07 MED ORDER — PROPOFOL 10 MG/ML IV BOLUS
INTRAVENOUS | Status: AC
  Filled 2018-08-07: qty 20

## 2018-08-07 MED ORDER — FAMOTIDINE 20 MG IN NS 100 ML IVPB
20.0000 mg | Freq: Once | INTRAVENOUS | Status: DC
  Filled 2018-08-07: qty 100

## 2018-08-07 MED ORDER — SUGAMMADEX SODIUM 200 MG/2ML IV SOLN
INTRAVENOUS | Status: DC | PRN
  Administered 2018-08-07: 200 mg via INTRAVENOUS

## 2018-08-07 MED ORDER — TRAMADOL HCL 50 MG PO TABS: 50.0000 mg | ORAL_TABLET | Freq: Four times a day (QID) | ORAL | 0 refills | Status: AC | PRN

## 2018-08-07 MED ORDER — MIDAZOLAM HCL 5 MG/5ML IJ SOLN
INTRAMUSCULAR | Status: DC | PRN
  Administered 2018-08-07: 2 mg via INTRAVENOUS

## 2018-08-07 MED ORDER — DEXAMETHASONE SODIUM PHOSPHATE 4 MG/ML IJ SOLN
INTRAMUSCULAR | Status: DC | PRN
  Administered 2018-08-07: 5 mg via INTRAVENOUS

## 2018-08-07 MED ORDER — LIDOCAINE HCL (CARDIAC) PF 100 MG/5ML IV SOSY
PREFILLED_SYRINGE | INTRAVENOUS | Status: DC | PRN
  Administered 2018-08-07: 80 mg via INTRAVENOUS

## 2018-08-07 MED ORDER — ROCURONIUM BROMIDE 50 MG/5ML IV SOSY
PREFILLED_SYRINGE | INTRAVENOUS | Status: AC
  Filled 2018-08-07: qty 5

## 2018-08-07 MED ORDER — FENTANYL CITRATE (PF) 100 MCG/2ML IJ SOLN
25.0000 ug | INTRAMUSCULAR | Status: DC | PRN
  Administered 2018-08-07 (×3): 50 ug via INTRAVENOUS

## 2018-08-07 MED ORDER — ACETAMINOPHEN 10 MG/ML IV SOLN
INTRAVENOUS | Status: AC
  Filled 2018-08-07: qty 100

## 2018-08-07 MED ORDER — PROPOFOL 10 MG/ML IV BOLUS
INTRAVENOUS | Status: DC | PRN
  Administered 2018-08-07: 200 mg via INTRAVENOUS

## 2018-08-07 MED ORDER — DOCUSATE SODIUM 100 MG PO CAPS: 100.0000 mg | ORAL_CAPSULE | Freq: Two times a day (BID) | ORAL | 0 refills | Status: DC

## 2018-08-07 MED ORDER — SODIUM CHLORIDE 0.9 % IR SOLN
Status: DC | PRN
  Administered 2018-08-07: 3000 mL

## 2018-08-07 MED ORDER — LIDOCAINE-EPINEPHRINE 1 %-1:100000 IJ SOLN
INTRAMUSCULAR | Status: DC | PRN
  Administered 2018-08-07: 20 mL

## 2018-08-07 MED ORDER — LIDOCAINE 2% (20 MG/ML) 5 ML SYRINGE
INTRAMUSCULAR | Status: AC
  Filled 2018-08-07: qty 5

## 2018-08-07 MED ORDER — FAMOTIDINE 20 MG IN NS 100 ML IVPB
20.0000 mg | INTRAVENOUS | Status: DC
  Filled 2018-08-07: qty 100

## 2018-08-07 MED ORDER — IBUPROFEN 600 MG PO TABS: 600.0000 mg | ORAL_TABLET | Freq: Four times a day (QID) | ORAL | 0 refills | Status: DC | PRN

## 2018-08-07 MED ORDER — ONDANSETRON HCL 4 MG/2ML IJ SOLN
INTRAMUSCULAR | Status: DC | PRN
  Administered 2018-08-07: 4 mg via INTRAVENOUS

## 2018-08-07 MED ORDER — RHO D IMMUNE GLOBULIN 1500 UNIT/2ML IJ SOSY
300.0000 ug | PREFILLED_SYRINGE | Freq: Once | INTRAMUSCULAR | Status: AC
  Administered 2018-08-07: 18:00:00 300 ug via INTRAMUSCULAR
  Filled 2018-08-07: qty 2

## 2018-08-07 MED ORDER — DEXAMETHASONE SODIUM PHOSPHATE 10 MG/ML IJ SOLN
INTRAMUSCULAR | Status: AC
  Filled 2018-08-07: qty 1

## 2018-08-07 MED ORDER — FENTANYL CITRATE (PF) 250 MCG/5ML IJ SOLN
INTRAMUSCULAR | Status: AC
  Filled 2018-08-07: qty 5

## 2018-08-07 MED ORDER — ONDANSETRON HCL 4 MG/2ML IJ SOLN
INTRAMUSCULAR | Status: AC
  Filled 2018-08-07: qty 2

## 2018-08-07 MED ORDER — SOD CITRATE-CITRIC ACID 500-334 MG/5ML PO SOLN: 30.0000 mL | Freq: Once | ORAL | Status: DC

## 2018-08-07 MED ORDER — TRAMADOL HCL 50 MG PO TABS
50.0000 mg | ORAL_TABLET | Freq: Once | ORAL | Status: AC
  Administered 2018-08-07: 21:00:00 50 mg via ORAL
  Filled 2018-08-07: qty 1

## 2018-08-07 MED ORDER — LACTATED RINGERS IV SOLN
INTRAVENOUS | Status: DC
  Administered 2018-08-07: 19:00:00 via INTRAVENOUS

## 2018-08-07 MED ORDER — FENTANYL CITRATE (PF) 100 MCG/2ML IJ SOLN
INTRAMUSCULAR | Status: AC
  Administered 2018-08-07: 22:00:00 50 ug via INTRAVENOUS
  Filled 2018-08-07: qty 2

## 2018-08-07 MED ORDER — KETOROLAC TROMETHAMINE 30 MG/ML IJ SOLN
INTRAMUSCULAR | Status: AC
  Filled 2018-08-07: qty 1

## 2018-08-07 MED ORDER — CEFAZOLIN SODIUM-DEXTROSE 2-4 GM/100ML-% IV SOLN
2.0000 g | INTRAVENOUS | Status: AC
  Administered 2018-08-07: 20:00:00 2 g via INTRAVENOUS
  Filled 2018-08-07: qty 100

## 2018-08-07 MED ORDER — ACETAMINOPHEN 10 MG/ML IV SOLN
INTRAVENOUS | Status: DC | PRN
  Administered 2018-08-07: 1000 mg via INTRAVENOUS

## 2018-08-07 MED ORDER — SOD CITRATE-CITRIC ACID 500-334 MG/5ML PO SOLN
ORAL | Status: AC
  Administered 2018-08-07: 15 mL
  Filled 2018-08-07: qty 15

## 2018-08-07 MED ORDER — PROMETHAZINE HCL 25 MG/ML IJ SOLN
INTRAMUSCULAR | Status: AC
  Administered 2018-08-07: 6.25 mg via INTRAVENOUS
  Filled 2018-08-07: qty 1

## 2018-08-07 MED ORDER — LIDOCAINE-EPINEPHRINE 1 %-1:100000 IJ SOLN
INTRAMUSCULAR | Status: AC
  Filled 2018-08-07: qty 1

## 2018-08-07 MED ORDER — PROMETHAZINE HCL 25 MG/ML IJ SOLN
6.2500 mg | INTRAMUSCULAR | Status: DC | PRN
  Administered 2018-08-07: 6.25 mg via INTRAVENOUS

## 2018-08-07 SURGICAL SUPPLY — 30 items
DERMABOND ADVANCED (GAUZE/BANDAGES/DRESSINGS) ×2
DERMABOND ADVANCED .7 DNX12 (GAUZE/BANDAGES/DRESSINGS) ×2 IMPLANT
DRSG OPSITE POSTOP 3X4 (GAUZE/BANDAGES/DRESSINGS) ×4 IMPLANT
DURAPREP 26ML APPLICATOR (WOUND CARE) ×4 IMPLANT
FORCEPS CUTTING 33CM 5MM (CUTTING FORCEPS) IMPLANT
GLOVE BIOGEL PI IND STRL 7.0 (GLOVE) ×8 IMPLANT
GLOVE BIOGEL PI INDICATOR 7.0 (GLOVE) ×8
GLOVE ECLIPSE 6.5 STRL STRAW (GLOVE) ×4 IMPLANT
GOWN STRL REUS W/ TWL LRG LVL3 (GOWN DISPOSABLE) ×4 IMPLANT
GOWN STRL REUS W/TWL LRG LVL3 (GOWN DISPOSABLE) ×4
KIT TURNOVER KIT B (KITS) ×4 IMPLANT
NEEDLE INSUFFLATION 14GA 120MM (NEEDLE) ×4 IMPLANT
PACK LAPAROSCOPY BASIN (CUSTOM PROCEDURE TRAY) ×4 IMPLANT
PACK TRENDGUARD 450 HYBRID PRO (MISCELLANEOUS) ×2 IMPLANT
PAD OB MATERNITY 4.3X12.25 (PERSONAL CARE ITEMS) ×4 IMPLANT
POUCH SPECIMEN RETRIEVAL 10MM (ENDOMECHANICALS) ×4 IMPLANT
PROTECTOR NERVE ULNAR (MISCELLANEOUS) ×4 IMPLANT
SET IRRIG TUBING LAPAROSCOPIC (IRRIGATION / IRRIGATOR) ×4 IMPLANT
SET TUBE SMOKE EVAC HIGH FLOW (TUBING) ×4 IMPLANT
SHEARS HARMONIC ACE PLUS 36CM (ENDOMECHANICALS) IMPLANT
SLEEVE ENDOPATH XCEL 5M (ENDOMECHANICALS) ×4 IMPLANT
SOLUTION ELECTROLUBE (MISCELLANEOUS) ×4 IMPLANT
SUT MON AB 4-0 PS1 27 (SUTURE) ×4 IMPLANT
SUT VICRYL 0 UR6 27IN ABS (SUTURE) ×4 IMPLANT
TOWEL OR 17X24 6PK STRL BLUE (TOWEL DISPOSABLE) ×8 IMPLANT
TRAY FOLEY W/BAG SLVR 14FR (SET/KITS/TRAYS/PACK) ×4 IMPLANT
TRENDGUARD 450 HYBRID PRO PACK (MISCELLANEOUS) ×4
TROCAR XCEL NON-BLD 11X100MML (ENDOMECHANICALS) ×4 IMPLANT
TROCAR XCEL NON-BLD 5MMX100MML (ENDOMECHANICALS) ×4 IMPLANT
WARMER LAPAROSCOPE (MISCELLANEOUS) ×4 IMPLANT

## 2018-08-07 NOTE — MAU Note (Signed)
Urine in lab 

## 2018-08-07 NOTE — MAU Note (Signed)
Dr Nelda Marseille in room with pt, discussing plan of care

## 2018-08-07 NOTE — Anesthesia Procedure Notes (Signed)
Procedure Name: Intubation Date/Time: 08/07/2018 7:34 PM Performed by: Oletta Lamas, CRNA Pre-anesthesia Checklist: Patient identified, Emergency Drugs available, Suction available and Patient being monitored Patient Re-evaluated:Patient Re-evaluated prior to induction Oxygen Delivery Method: Circle System Utilized Preoxygenation: Pre-oxygenation with 100% oxygen Induction Type: IV induction and Rapid sequence Laryngoscope Size: Miller and 2 Grade View: Grade I Tube type: Oral Tube size: 7.0 mm Number of attempts: 1 Airway Equipment and Method: Stylet Placement Confirmation: ETT inserted through vocal cords under direct vision,  positive ETCO2 and breath sounds checked- equal and bilateral Secured at: 22 cm Tube secured with: Tape Dental Injury: Teeth and Oropharynx as per pre-operative assessment

## 2018-08-07 NOTE — MAU Note (Signed)
+  HPT on today.  Has been having LLQ pain since Thursday. Hx of ectopic preg.  This scared her and she came.  Been spotting since Thursday.  Pain was worse over the weekend "past a 10".

## 2018-08-07 NOTE — Op Note (Signed)
PREOPERATIVE DIAGNOSIS:  Ectopic pregnancy POSTOPERATIVE DIAGNOSIS: same and pelvic adhesions PROCEDURE PERFORMED: Diagnostic laparoscopy, removal of left ectopic and salpingectomy SURGEON: Dr. Janyth Pupa ASSISTANT:Nancy Prothero, CNM ANESTHESIA: General endotracheal.  ESTIMATED BLOOD LOSS: 50cc.  URINE OUTPUT: 100cc of clear yellow urine at the end of the procedure.  IV FLUIDS: 1400cc of crystalloid.  SPECIMEN(S): left tube with ectopic pregnancy COMPLICATIONS: None.  CONDITION: Stable.  FINDINGS: No ascites or peritoneal studding was appreciated.  Liver, gallbladder and bowel appeared grossly normal. Uterus normal size and shape- anterior body of the uterus adherent to the top of the peritoneum/abdominal cavity.  Thin omental adhesions were noted immediately caudad to this area.  Normal right tube and ovary.  Left ovary appeared normal with some adherent clot, ~ 3cm ectopic pregnancy noted in the mid-portion of the left fallopian tube  Informed consent was obtained from the patient prior to taking her to the operating room where anesthesia was found to be adequate. She was placed in dorsal lithotomy position and examined under anesthesia. She was prepped and draped in normal sterile fashion. The bladder was catheterized with a foley under sterile technique.  A bi-valve speculum was then placed and the anterior lip of the cervix was grasped with the single tooth tenaculum. The Hulka uterine manipulator was then advanced into the uterus to provide uterine mobility. The speculum and tenaculum were then removed.  Attention was then turned to the patients abdomen where a 5 mm infraumbilical skin incision was made with the scalpel. The veress needle was carefully introduced into the peritoneal cavity while tenting the abdominal wall. Intraperitoneal placement was confirmed by use of a saline-drop test.  The gas was connected and confirmed intrabdominal placement by a low initial pressure of 78mmHg.  The abdomen was then insuflated with CO2 gas. The trocar and sleeve were then advanced without difficulty into the abdomen under direct visualization. Intraabdominal placement was confirmed by the laparoscope and surveillance of the abdomen was performed. Findings as mentioned above.  In the left lower quadrant an additional 44mm port was placed.  On the right side a 14mm port was placed- both were placed under direct visualization.   The thin omental adhesions were taken down with the harmonic.  The left fallopian tube was then placed on tension and using the Harmonic,serial ligations was used for full dissection The endocatch bag was then placed through the 10 mm port and the specimens were removed in its entirety. Re-examination of the uterus and abdominal cavity confirmed hemostasis. Irrigation was completed, excellent hemostasis was noted.   A Carter-Thompson was then inserted into the left lower quadrant port for closure of the fascia. The fascia was closed using 0-vicryl without difficulty under direct visualization. The instruments were then removed from the patients abdomen with air allowed to fully escape. The port sites were then closed with monocryl. The manipulator and foley catheter was then removed from the cervix with no lacerations or bleeding identified. The patient tolerated the procedure well with all sponge, lap, and needle counts correct. The patient was taken to recovery in stable condition.   Janyth Pupa, DO [phone] (cell) [phone] (office)

## 2018-08-07 NOTE — Anesthesia Preprocedure Evaluation (Addendum)
Anesthesia Evaluation  Patient identified by MRN, date of birth, ID band Patient awake    Reviewed: Allergy & Precautions, NPO status , Patient's Chart, lab work & pertinent test results  Airway Mallampati: II  TM Distance: >3 FB Neck ROM: Full    Dental  (+) Dental Advisory Given, Chipped, Poor Dentition, Caps   Pulmonary neg pulmonary ROS,    Pulmonary exam normal breath sounds clear to auscultation       Cardiovascular negative cardio ROS   Rhythm:Regular Rate:Tachycardia     Neuro/Psych PSYCHIATRIC DISORDERS Anxiety Depression H/o orbital pseudotumor negative neurological ROS     GI/Hepatic negative GI ROS, Neg liver ROS,   Endo/Other  Obesity   Renal/GU negative Renal ROS     Musculoskeletal negative musculoskeletal ROS (+)   Abdominal   Peds  Hematology  (+) Blood dyscrasia, anemia ,   Anesthesia Other Findings Day of surgery medications reviewed with the patient.  Reproductive/Obstetrics (+) Pregnancy +Ectopic pregnancy                              Anesthesia Physical Anesthesia Plan  ASA: II and emergent  Anesthesia Plan: General   Post-op Pain Management:    Induction: Intravenous, Rapid sequence and Cricoid pressure planned  PONV Risk Score and Plan: 4 or greater and Midazolam, Dexamethasone and Ondansetron  Airway Management Planned: Oral ETT  Additional Equipment:   Intra-op Plan:   Post-operative Plan: Extubation in OR  Informed Consent: I have reviewed the patients History and Physical, chart, labs and discussed the procedure including the risks, benefits and alternatives for the proposed anesthesia with the patient or authorized representative who has indicated his/her understanding and acceptance.     Dental advisory given  Plan Discussed with: CRNA  Anesthesia Plan Comments: (Emergent nature of patient's condition reason for pre-op evaluation being  filed after induction of anesthesia.)        Anesthesia Quick Evaluation

## 2018-08-07 NOTE — H&P (Signed)
Caitlyn Roy is an 33 y.o. female, [phone] who presents for suspected ectopic pregnancy.  Pt recently started OCPs about 2 weeks ago; however, she noted intense pelvic pain that started this past Thursday.  She had a similar experience with her prior ectopic.  Pain continued on through the weekend and when it continued to progress she decided to come in.  She notes some light spotting.  Patient Active Problem List   Diagnosis Date Noted  . S/P repeat low transverse C-section 04/06/2018  . H/O: cesarean section 04/06/2018  . Goiter 08/16/2017  . Acute appendicitis 10/20/2016  . Iron deficiency anemia 03/03/2016  . Palpitations 03/03/2016  . BV (bacterial vaginosis) 10/15/2014    Pertinent Gynecological History: Menses: unknown last period due to recent delivery Contraception: started OCPs ~ 2wks ago Previous GYN Procedures: C-section x 2, diagnostic laparoscopy for ectopic  OBhx: Prior C-section x2 SAB x 4   MEDICAL/FAMILY/SOCIAL HX: Patient's last menstrual period was 06/22/2018.    Past Medical History:  Diagnosis Date  . Anemia   . Anginal pain ([place])    being followed by cardiologists  . Anxiety   . Depression   . Dysrhythmia   . Fibroid   . H/O cesarean section 2010  . Nausea    d/t to "issues with gallballader"  . 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 Tubal pregnancy   . Vaginal odor   . Vaginitis     Past Surgical History:  Procedure Laterality Date  . BIOPSY EYE MUSCLE    . CESAREAN SECTION  2010  . CESAREAN SECTION N/A 04/06/2018   Procedure: CESAREAN SECTION bladder irrigation;  Surgeon: [name], MD;  Location: [facility];  Service: Obstetrics;  Laterality: N/A;  needs RNFA  . LAPAROSCOPIC APPENDECTOMY N/A 10/20/2016   Procedure: APPENDECTOMY LAPAROSCOPIC;  Surgeon: Judeth Horn, MD;  Location: [facility] OR;  Service: General;  Laterality: N/A;    Family History  Problem  Relation Age of Onset  . Hypertension Mother   . Breast cancer Mother   . Heart attack Mother   . Seizures Mother   . Alcohol abuse Father   . Kidney disease Maternal Grandmother   . Diabetes Paternal Grandmother     Social History:  reports that she has never smoked. She has never used smokeless tobacco. She reports that she does not drink alcohol or use drugs.  ALLERGIES/MEDS:  Allergies:  Allergies  Allergen Reactions  . Oxycodone Shortness Of Breath and Itching    Medications Prior to Admission  Medication Sig Dispense Refill Last Dose  . Fe Fum-FePoly-Vit C-Vit B3 (INTEGRA) 62.5-62.5-40-3 MG CAPS Take 1 capsule by mouth every evening.   2 08/07/2018 at Unknown time  . Prenat-FeFum-FePo-FA-Omega 3 (CONCEPT DHA) 53.5-38-1 MG CAPS Take 1 capsule by mouth daily.  4 08/07/2018 at Unknown time  . famotidine (PEPCID) 20 MG tablet Take 1 tablet (20 mg total) by mouth daily. 30 tablet 1   . ketorolac (TORADOL) 10 MG tablet Take 1 tablet (10 mg total) by mouth every 6 (six) hours as needed for moderate pain or severe pain. 15 tablet 0   . traMADol (ULTRAM) 50 MG tablet Take 1 tablet (50 mg total) by mouth every 6 (six) hours as needed for moderate pain or severe pain. 15 tablet 0      Review of Systems  Constitutional: Negative for chills and fever.  HENT: Negative.   Eyes: Negative.   Respiratory: Negative.  Cardiovascular: Negative.   Gastrointestinal: Positive for abdominal pain and nausea. Negative for vomiting.  Genitourinary: Negative.   Musculoskeletal: Negative.   Skin: Negative.   Neurological: Negative.   Psychiatric/Behavioral: Negative for depression. The patient is nervous/anxious.     Blood pressure 114/79, pulse 93, temperature 98.3 F (36.8 C), temperature source Oral, resp. rate 17, weight 80.1 kg, last menstrual period 06/22/2018, SpO2 100 %, unknown if currently breastfeeding. Physical Exam  Constitutional: She is oriented to person, place, and time. She  appears well-developed and well-nourished.  HENT:  Head: Normocephalic.  Eyes: EOM are normal.  Neck: Neck supple.  Cardiovascular: Regular rhythm and normal heart sounds.  Respiratory: Effort normal. No respiratory distress. She has no wheezes. She has rales.  GI: There is abdominal tenderness.  Musculoskeletal:        General: No tenderness or edema.  Neurological: She is alert and oriented to person, place, and time.  Skin: Skin is warm and dry.  Psychiatric: She has a normal mood and affect.      Results for orders placed or performed during the hospital encounter of 08/07/18 (from the past 24 hour(s))  Pregnancy, urine POC     Status: Abnormal   Collection Time: 08/07/18  4:25 PM  Result Value Ref Range   Preg Test, Ur POSITIVE (A) NEGATIVE  CBC     Status: Abnormal   Collection Time: 08/07/18  4:50 PM  Result Value Ref Range   WBC 6.0 4.0 - 10.5 K/uL   RBC 4.15 3.87 - 5.11 MIL/uL   Hemoglobin 11.6 (L) 12.0 - 15.0 g/dL   HCT 36.0 36.0 - 46.0 %   MCV 86.7 80.0 - 100.0 fL   MCH 28.0 26.0 - 34.0 pg   MCHC 32.2 30.0 - 36.0 g/dL   RDW 13.2 11.5 - 15.5 %   Platelets 385 150 - 400 K/uL   nRBC 0.0 0.0 - 0.2 %  hCG, quantitative, pregnancy     Status: Abnormal   Collection Time: 08/07/18  4:50 PM  Result Value Ref Range   hCG, Beta Chain, Quant, S 6,924 (H) <5 mIU/mL  Wet prep, genital     Status: Abnormal   Collection Time: 08/07/18  4:50 PM  Result Value Ref Range   Yeast Wet Prep HPF POC NONE SEEN NONE SEEN   Trich, Wet Prep NONE SEEN NONE SEEN   Clue Cells Wet Prep HPF POC PRESENT (A) NONE SEEN   WBC, Wet Prep HPF POC MODERATE (A) NONE SEEN   Sperm NONE SEEN   Rh IG workup (includes ABO/Rh)     Status: None (Preliminary result)   Collection Time: 08/07/18  4:50 PM  Result Value Ref Range   Gestational Age(Wks) 6    ABO/RH(D) O NEG    Antibody Screen NEG    Unit Number [id number]    Blood Component Type RHIG    Unit division 00    Status of Unit ISSUED     Transfusion Status      OK TO TRANSFUSE Performed at [facility], [address], [place], [place] [address]     US Ob Less Than 14 Weeks With Ob Transvaginal  Result Date: 08/07/2018 CLINICAL DATA:  Positive home pregnancy test. LEFT lower quadrant pain. EXAM: OBSTETRIC <14 WK [place] AND TRANSVAGINAL OB US TECHNIQUE: Both transabdominal and transvaginal ultrasound examinations were performed for complete evaluation of the gestation as well as the maternal uterus, adnexal regions, and pelvic cul-de-sac. Transvaginal technique was performed  to assess early pregnancy. COMPARISON:  None. FINDINGS: Intrauterine gestational sac: None Yolk sac:  None Embryo:  None Cardiac Activity: None Heart Rate: None bpm Subchorionic hemorrhage: None visualized. Endometrial thickness 1.3 cm. Maternal uterus/adnexae: Normal RIGHT and LEFT ovaries. In the LEFT adnexa medial to the LEFT ovary, there is a heterogeneous structure with anechoic center and a hypoechoic ring. There is surrounding vascularity. The lesion measures 3.0 x 1.9 x 1.9 cm. There is trace free fluid. IMPRESSION: Concern for LEFT adnexal ectopic pregnancy. No intrauterine pregnancy. See discussion above Critical Value/emergent results were called by telephone at the time of interpretation by myself on 08/07/2018 at 5:49 pm to nurse Ginger, who verbally acknowledged these results. Electronically Signed   By: Staci Righter M.D.   On: 08/07/2018 17:51     ASSESSMENT: 33yo [id number] who presents for ectopic pregnancy  PLAN: -Due to size of ectopic pregnancy and current hCG level- recommendations for surgical intervention.  Discussed risk/benefit and alternatives including but not limited to risk of bleeding, infection, injury to surrounding organs.  Discussed medication alternative, which she declined.  Also discussed that due to prior surgery unknown if prior fallopian tube has already been removed.  Pt states that this is an unwanted pregnancy and does not  desire future pregnancy. Questions and concerns were addressed and she desires to proceed -NPO -LR @ 125cc -SCDs to OR -Ancef 2g IV   Annalee Genta CNM 08/07/2018, 6:43 PM

## 2018-08-07 NOTE — MAU Provider Note (Signed)
History     CSN: [id number]  Arrival date and time: 08/07/18 1543   First Provider Initiated Contact with Patient 08/07/18 1642      Chief Complaint  Patient presents with  . Abdominal Pain  . Possible Pregnancy  . Vaginal Bleeding   [id number] @[redacted]w[redacted]d  by unsure LMP presenting with LAP and VB. Sx started 6 days ago. LAP is located SP and LLQ. Describes as constant. Rates 7/10. Has not taken anything for it. VB is dark red/brown and lite. Having nausea but no vomiting. No urinary sx.    OB History    Gravida  9   Para  2   Term  2   Preterm      AB  6   Living  2     SAB  4   TAB  1   Ectopic  1   Multiple  0   Live Births  2           Past Medical History:  Diagnosis Date  . Anemia   . Anginal pain ([place])    being followed by cardiologists  . Anxiety   . Depression   . Dysrhythmia   . Fibroid   . H/O cesarean section 2010  . Nausea    d/t to "issues with gallballader"  . 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 Tubal pregnancy   . Vaginal odor   . Vaginitis     Past Surgical History:  Procedure Laterality Date  . BIOPSY EYE MUSCLE    . CESAREAN SECTION  2010  . CESAREAN SECTION N/A 04/06/2018   Procedure: CESAREAN SECTION bladder irrigation;  Surgeon: [name], MD;  Location: [facility];  Service: Obstetrics;  Laterality: N/A;  needs RNFA  . LAPAROSCOPIC APPENDECTOMY N/A 10/20/2016   Procedure: APPENDECTOMY LAPAROSCOPIC;  Surgeon: Judeth Horn, MD;  Location: [facility] OR;  Service: General;  Laterality: N/A;    Family History  Problem Relation Age of Onset  . Hypertension Mother   . Breast cancer Mother   . Heart attack Mother   . Seizures Mother   . Alcohol abuse Father   . Kidney disease Maternal Grandmother   . Diabetes Paternal Grandmother     Social History   Tobacco Use  . Smoking status: Never Smoker  . Smokeless tobacco: Never Used  Substance Use Topics   . Alcohol use: No    Frequency: Never  . Drug use: No    Allergies:  Allergies  Allergen Reactions  . Oxycodone Shortness Of Breath and Itching    Medications Prior to Admission  Medication Sig Dispense Refill Last Dose  . Fe Fum-FePoly-Vit C-Vit B3 (INTEGRA) 62.5-62.5-40-3 MG CAPS Take 1 capsule by mouth every evening.   2 08/07/2018 at Unknown time  . Prenat-FeFum-FePo-FA-Omega 3 (CONCEPT DHA) 53.5-38-1 MG CAPS Take 1 capsule by mouth daily.  4 08/07/2018 at Unknown time  . famotidine (PEPCID) 20 MG tablet Take 1 tablet (20 mg total) by mouth daily. 30 tablet 1   . ketorolac (TORADOL) 10 MG tablet Take 1 tablet (10 mg total) by mouth every 6 (six) hours as needed for moderate pain or severe pain. 15 tablet 0   . traMADol (ULTRAM) 50 MG tablet Take 1 tablet (50 mg total) by mouth every 6 (six) hours as needed for moderate pain or severe pain. 15 tablet 0     Review of Systems  Constitutional: Negative for fever.  Gastrointestinal: Positive for abdominal pain and nausea. Negative for constipation, diarrhea and vomiting.  Genitourinary: Positive for vaginal bleeding. Negative for dysuria, hematuria and urgency.   Physical Exam   Blood pressure 139/75, pulse 84, temperature 98.6 F (37 C), temperature source Oral, resp. rate 16, height 5\' 2"  (1.575 m), weight 80.1 kg, last menstrual period 06/22/2018, SpO2 100 %, unknown if currently breastfeeding.  Physical Exam  Nursing note and vitals reviewed. Constitutional: She is oriented to person, place, and time. She appears well-developed and well-nourished. No distress (appears comfortable).  HENT:  Head: Normocephalic and atraumatic.  Neck: Normal range of motion.  Cardiovascular: Normal rate.  Respiratory: Effort normal. No respiratory distress.  GI: Soft. She exhibits no distension and no mass. There is no abdominal tenderness. There is no rebound and no guarding.  Genitourinary:    Genitourinary Comments: External: no lesions or  erythema Vagina: rugated, pink, moist, small drk bloody discharge Uterus: + enlarged, anteverted, non tender, no CMT Adnexae: no masses, + tenderness left, no tenderness right Cervix closed    Musculoskeletal: Normal range of motion.  Neurological: She is alert and oriented to person, place, and time.  Skin: Skin is warm.  Psychiatric: She has a normal mood and affect.   Results for orders placed or performed during the hospital encounter of 08/07/18 (from the past 24 hour(s))  Pregnancy, urine POC     Status: Abnormal   Collection Time: 08/07/18  4:25 PM  Result Value Ref Range   Preg Test, Ur POSITIVE (A) NEGATIVE  CBC     Status: Abnormal   Collection Time: 08/07/18  4:50 PM  Result Value Ref Range   WBC 6.0 4.0 - 10.5 K/uL   RBC 4.15 3.87 - 5.11 MIL/uL   Hemoglobin 11.6 (L) 12.0 - 15.0 g/dL   HCT 36.0 36.0 - 46.0 %   MCV 86.7 80.0 - 100.0 fL   MCH 28.0 26.0 - 34.0 pg   MCHC 32.2 30.0 - 36.0 g/dL   RDW 13.2 11.5 - 15.5 %   Platelets 385 150 - 400 K/uL   nRBC 0.0 0.0 - 0.2 %  hCG, quantitative, pregnancy     Status: Abnormal   Collection Time: 08/07/18  4:50 PM  Result Value Ref Range   hCG, Beta Chain, Quant, S 6,924 (H) <5 mIU/mL  Wet prep, genital     Status: Abnormal   Collection Time: 08/07/18  4:50 PM  Result Value Ref Range   Yeast Wet Prep HPF POC NONE SEEN NONE SEEN   Trich, Wet Prep NONE SEEN NONE SEEN   Clue Cells Wet Prep HPF POC PRESENT (A) NONE SEEN   WBC, Wet Prep HPF POC MODERATE (A) NONE SEEN   Sperm NONE SEEN   Rh IG workup (includes ABO/Rh)     Status: None (Preliminary result)   Collection Time: 08/07/18  4:50 PM  Result Value Ref Range   Gestational Age(Wks) 6    ABO/RH(D) O NEG    Antibody Screen NEG    Unit Number [id number]    Blood Component Type RHIG    Unit division 00    Status of Unit ISSUED    Transfusion Status      OK TO TRANSFUSE Performed at [facility], [address] [place], [place] [address]   Type and screen  [place]     Status: None   Collection Time: 08/07/18  7:40 PM  Result Value Ref Range   ABO/RH(D) Jenetta Downer  NEG    Antibody Screen NEG    Sample Expiration      08/10/2018 Performed at [facility], [place] [address], [place], [place] [address]    US Ob Less Than 14 Weeks With Ob Transvaginal  Result Date: 08/07/2018 CLINICAL DATA:  Positive home pregnancy test. LEFT lower quadrant pain. EXAM: OBSTETRIC <14 WK [place] AND TRANSVAGINAL OB US TECHNIQUE: Both transabdominal and transvaginal ultrasound examinations were performed for complete evaluation of the gestation as well as the maternal uterus, adnexal regions, and pelvic cul-de-sac. Transvaginal technique was performed to assess early pregnancy. COMPARISON:  None. FINDINGS: Intrauterine gestational sac: None Yolk sac:  None Embryo:  None Cardiac Activity: None Heart Rate: None bpm Subchorionic hemorrhage: None visualized. Endometrial thickness 1.3 cm. Maternal uterus/adnexae: Normal RIGHT and LEFT ovaries. In the LEFT adnexa medial to the LEFT ovary, there is a heterogeneous structure with anechoic center and a hypoechoic ring. There is surrounding vascularity. The lesion measures 3.0 x 1.9 x 1.9 cm. There is trace free fluid. IMPRESSION: Concern for LEFT adnexal ectopic pregnancy. No intrauterine pregnancy. See discussion above Critical Value/emergent results were called by telephone at the time of interpretation by myself on 08/07/2018 at 5:49 pm to nurse Ginger, who verbally acknowledged these results. Electronically Signed   By: Staci Righter M.D.   On: 08/07/2018 17:51   MAU Course  Procedures Orders Placed This Encounter  Procedures  . Wet prep, genital    Standing Status:   Standing    Number of Occurrences:   1    Order Specific Question:   Patient immune status    Answer:   Normal  . US OB LESS THAN 14 WEEKS WITH OB TRANSVAGINAL    Standing Status:   Standing    Number of Occurrences:   1    Order Specific Question:    Symptom/Reason for Exam    Answer:   Abdominal pain affecting pregnancy [[id number]]  . CBC    Standing Status:   Standing    Number of Occurrences:   1  . hCG, quantitative, pregnancy    Standing Status:   Standing    Number of Occurrences:   1  . Diet NPO time specified Except for: Sips with Meds    Standing Status:   Standing    Number of Occurrences:   1    Order Specific Question:   Except for    Answer:   Sips with Meds  . Initiate Pre-op Protocol    Standing Status:   Standing    Number of Occurrences:   1  . Pre-admission testing diagnosis    Standing Status:   Standing    Number of Occurrences:   1    Order Specific Question:   Diagnosis    Answer:   Encounter for other preprocedural examination [Z01.818]  . Anesthesia per [facility]    Standing Status:   Standing    Number of Occurrences:   1  . SCD's    Standing Status:   Standing    Number of Occurrences:   1    Order Specific Question:   Laterality    Answer:   Bilateral  . Outpatient / Same Day Surgery    Standing Status:   Standing    Number of Occurrences:   1  . Place and maintain sequential compression device    Standing Status:   Standing    Number of Occurrences:   1  . Practitioner  attestation of consent    I, the ordering practitioner, attest that I have discussed with the patient the benefits, risks, side effects, alternatives, likelihood of achieving goals and potential problems during recovery for the procedure listed.    Standing Status:   Standing    Number of Occurrences:   1    Order Specific Question:   Procedure    Answer:   Blood product(s)  . Complete patient signature process for consent form    Standing Status:   Standing    Number of Occurrences:   1  . Full code    Standing Status:   Standing    Number of Occurrences:   1  . Pregnancy, urine POC    Standing Status:   Standing    Number of Occurrences:   1  . Rh IG workup (includes ABO/Rh)    Standing Status:    Standing    Number of Occurrences:   1    Order Specific Question:   Weeks of Gestation    Answer:   6  . Type and screen [place]     Standing Status:   Standing    Number of Occurrences:   1  . Insert peripheral IV    Standing Status:   Standing    Number of Occurrences:   1   MDM Labs and [place] ordered and reviewed. [place] consistent with ectopic pregnancy. Rhogam given. 1810: Dr. Nelda Marseille notified of pt presentation and clinical findings. Plan for OR.   Assessment and Plan  Ectopic pregnancy Rh negative Admit to OR Mngt per Dr. Matilde Bash, CNM 08/07/2018, 8:48 PM

## 2018-08-07 NOTE — Transfer of Care (Signed)
Immediate Anesthesia Transfer of Care Note  Patient: Caitlyn Roy  Procedure(s) Performed: DIAGNOSTIC LAPAROSCOPY WITH REMOVAL OF ECTOPIC PREGNANCY (N/A ) LAPAROSCOPIC SALPINGOOPHERECTOMY LEFT (Left Abdomen)  Patient Location: PACU  Anesthesia Type:General  Level of Consciousness: awake, alert , oriented and patient cooperative  Airway & Oxygen Therapy: Patient Spontanous Breathing  Post-op Assessment: Report given to RN and Post -op Vital signs reviewed and stable  Post vital signs: Reviewed and stable  Last Vitals:  Vitals Value Taken Time  BP    Temp    Pulse 83 08/07/2018  8:50 PM  Resp 24 08/07/2018  8:50 PM  SpO2 100 % 08/07/2018  8:50 PM  Vitals shown include unvalidated device data.  Last Pain:  Vitals:   08/07/18 1854  TempSrc: Oral  PainSc:       Patients Stated Pain Goal: 0 (28/40/69 8614)  Complications: No apparent anesthesia complications

## 2018-08-07 NOTE — MAU Note (Signed)
Call rec'd from Dr Simona Huh, pt had called office, sudden onset of lower abd pain, hx of ectopic preg. Pt was advised to do preg test and call back.  Pt now at MAU, had not called office back.

## 2018-08-08 ENCOUNTER — Encounter (HOSPITAL_COMMUNITY): Payer: Self-pay | Admitting: Obstetrics & Gynecology

## 2018-08-08 LAB — RH IG WORKUP (INCLUDES ABO/RH)
ABO/RH(D): O NEG
Antibody Screen: NEGATIVE
Gestational Age(Wks): 6
Unit division: 0

## 2018-08-08 LAB — GC/CHLAMYDIA PROBE AMP (~~LOC~~) NOT AT ARMC
Chlamydia: NEGATIVE
Neisseria Gonorrhea: NEGATIVE

## 2018-08-08 NOTE — Anesthesia Postprocedure Evaluation (Signed)
Anesthesia Post Note  Patient: Caitlyn Roy  Procedure(s) Performed: DIAGNOSTIC LAPAROSCOPY WITH REMOVAL OF ECTOPIC PREGNANCY (N/A ) LAPAROSCOPIC SALPINGOOPHERECTOMY LEFT (Left Abdomen)     Patient location during evaluation: PACU Anesthesia Type: General Level of consciousness: awake and alert Pain management: pain level controlled Vital Signs Assessment: post-procedure vital signs reviewed and stable Respiratory status: spontaneous breathing, nonlabored ventilation and respiratory function stable Cardiovascular status: blood pressure returned to baseline and stable Postop Assessment: no apparent nausea or vomiting Anesthetic complications: no    Last Vitals:  Vitals:   08/07/18 2150 08/07/18 2220  BP: 117/66 130/75  Pulse: 96 85  Resp: 16 19  Temp:  (!) 36.1 C  SpO2: 99% 100%    Last Pain:  Vitals:   08/07/18 2118  TempSrc:   PainSc: [place]

## 2018-10-21 ENCOUNTER — Encounter (HOSPITAL_COMMUNITY): Payer: Self-pay

## 2018-10-28 ENCOUNTER — Emergency Department (HOSPITAL_COMMUNITY)
Admission: EM | Admit: 2018-10-28 | Discharge: 2018-10-29 | Disposition: A | Discharge status: Home / Self Care | Payer: 59 | Attending: Emergency Medicine | Admitting: Emergency Medicine

## 2018-10-28 ENCOUNTER — Encounter (HOSPITAL_COMMUNITY): Payer: Self-pay

## 2018-10-28 ENCOUNTER — Emergency Department (HOSPITAL_COMMUNITY): Payer: 59

## 2018-10-28 DIAGNOSIS — R0602 Shortness of breath: Secondary | ICD-10-CM | POA: Diagnosis not present

## 2018-10-28 DIAGNOSIS — R05 Cough: Secondary | ICD-10-CM | POA: Diagnosis present

## 2018-10-28 DIAGNOSIS — Z5321 Procedure and treatment not carried out due to patient leaving prior to being seen by health care provider: Secondary | ICD-10-CM | POA: Insufficient documentation

## 2018-10-28 DIAGNOSIS — R079 Chest pain, unspecified: Secondary | ICD-10-CM | POA: Insufficient documentation

## 2018-10-28 LAB — CBC
HCT: 36.3 % (ref 36.0–46.0)
Hemoglobin: 11.5 g/dL — ABNORMAL LOW (ref 12.0–15.0)
MCH: 27.3 pg (ref 26.0–34.0)
MCHC: 31.7 g/dL (ref 30.0–36.0)
MCV: 86.2 fL (ref 80.0–100.0)
Platelets: 372 10*3/uL (ref 150–400)
RBC: 4.21 MIL/uL (ref 3.87–5.11)
RDW: 13.3 % (ref 11.5–15.5)
WBC: 6.8 10*3/uL (ref 4.0–10.5)
nRBC: 0 % (ref 0.0–0.2)

## 2018-10-28 MED ORDER — SODIUM CHLORIDE 0.9% FLUSH: 3.0000 mL | Freq: Once | INTRAVENOUS | Status: DC

## 2018-10-28 NOTE — ED Triage Notes (Signed)
Pt states that she has cough, CP and SOB for the past two weeks. Denies n/v/fevers.

## 2018-10-29 LAB — BASIC METABOLIC PANEL
Anion gap: 11 (ref 5–15)
BUN: 8 mg/dL (ref 6–20)
CO2: 22 mmol/L (ref 22–32)
Calcium: 9.1 mg/dL (ref 8.9–10.3)
Chloride: 104 mmol/L (ref 98–111)
Creatinine, Ser: 0.86 mg/dL (ref 0.44–1.00)
GFR calc Af Amer: >60 mL/min (ref >60)
GFR calc non Af Amer: >60 mL/min (ref >60)
Glucose, Bld: 165 mg/dL — ABNORMAL HIGH (ref 70–99)
Potassium: 3.3 mmol/L — ABNORMAL LOW (ref 3.5–5.1)
Sodium: 137 mmol/L (ref 135–145)

## 2018-10-29 LAB — I-STAT BETA HCG BLOOD, ED (MC, WL, AP ONLY): I-stat hCG, quantitative: <5 m[IU]/mL (ref <5)

## 2018-10-29 LAB — TROPONIN I: Troponin I: <0.03 ng/mL (ref <0.03)

## 2018-10-29 NOTE — ED Notes (Signed)
No answer for vitals x 3  

## 2018-11-21 ENCOUNTER — Ambulatory Visit (INDEPENDENT_AMBULATORY_CARE_PROVIDER_SITE_OTHER): Payer: 59 | Admitting: Pulmonary Disease

## 2018-11-21 ENCOUNTER — Other Ambulatory Visit: Payer: Self-pay

## 2018-11-21 ENCOUNTER — Encounter: Payer: Self-pay | Admitting: Pulmonary Disease

## 2018-11-21 VITALS — BP 130/68 | HR 91 | Temp 98.0°F | Ht 62.0 in | Wt 176.2 lb

## 2018-11-21 DIAGNOSIS — J45909 Unspecified asthma, uncomplicated: Secondary | ICD-10-CM

## 2018-11-21 NOTE — Progress Notes (Signed)
Subjective:    Patient ID: Caitlyn Roy, female    DOB: 10/29/1985, 33 y.o.   MRN: [id number]  Patient with a history of wheezing/new asthma  Did not have asthma growing up Has had some work done for mold/mildew in the house in the last few months Recently got a shot of steroids which helped symptoms Use of albuterol does help symptoms and there is decreased use of albuterol following a steroid shot  No significant family history of asthma, son has asthma Does not smoke No chronic medical conditions  Wheezing has no predilection for any particular time of the day She does not know that she has any significant allergies  Family History  Problem Relation Age of Onset  . Hypertension Mother   . Breast cancer Mother   . Heart attack Mother   . Seizures Mother   . Alcohol abuse Father   . Kidney disease Maternal Grandmother   . Diabetes Paternal Grandmother    Social History   Socioeconomic History  . Marital status: Single    Spouse name: Not on file  . Number of children: 1  . Years of education: college   . Highest education level: Not on file  Occupational History  . Not on file  Social Needs  . Financial resource strain: Not hard at all  . Food insecurity    Worry: Never true    Inability: Never true  . Transportation needs    Medical: No    Non-medical: Not on file  Tobacco Use  . Smoking status: Never Smoker  . Smokeless tobacco: Never Used  Substance and Sexual Activity  . Alcohol use: No    Frequency: Never  . Drug use: No  . Sexual activity: Yes    Birth control/protection: None  Lifestyle  . Physical activity    Days per week: Not on file    Minutes per session: Not on file  . Stress: Not at all  Relationships  . Social Herbalist on phone: Not on file    Gets together: Not on file    Attends religious service: Not on file    Active member of club or organization: Not on file    Attends meetings of clubs or organizations:  Not on file    Relationship status: Not on file  . Intimate partner violence    Fear of current or ex partner: No    Emotionally abused: No    Physically abused: No    Forced sexual activity: No  Other Topics Concern  . Not on file  Social History Narrative   Lives at home with son   Drinks no caffeine    Past Medical History:  Diagnosis Date  . Anemia   . Anginal pain ([place])    being followed by cardiologists  . Anxiety   . Depression   . Dysrhythmia   . Fibroid   . H/O cesarean section 2010  . Nausea    d/t to "issues with gallballader"  . 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 Tubal pregnancy   . Vaginal odor   . Vaginitis    Review of Systems  Constitutional: Negative for fever and unexpected weight change.  HENT: Positive for nosebleeds, sinus pressure and sneezing. Negative for congestion, dental problem, ear pain, postnasal drip, rhinorrhea, sore throat and trouble swallowing.   Eyes: Negative for redness and itching.  Respiratory: Positive for  cough, chest tightness, shortness of breath and wheezing.   Cardiovascular: Negative for palpitations and leg swelling.  Gastrointestinal: Negative for nausea and vomiting.  Genitourinary: Negative for dysuria.  Musculoskeletal: Negative for joint swelling.  Skin: Negative for rash.  Allergic/Immunologic: Positive for environmental allergies. Negative for food allergies and immunocompromised state.  Neurological: Negative for headaches.  Hematological: Does not bruise/bleed easily.  Psychiatric/Behavioral: Negative for dysphoric mood. The patient is not nervous/anxious.       Objective:   Physical Exam Constitutional:      Appearance: Normal appearance.  HENT:     Head: Normocephalic and atraumatic.  Neck:     Musculoskeletal: Normal range of motion and neck supple. No neck rigidity or muscular tenderness.  Cardiovascular:     Rate and Rhythm: Normal  rate and regular rhythm.     Pulses: Normal pulses.     Heart sounds: Normal heart sounds. No murmur. No friction rub.  Pulmonary:     Effort: Pulmonary effort is normal. No respiratory distress.     Breath sounds: Normal breath sounds. No stridor. No wheezing or rhonchi.  Abdominal:     General: There is no distension.     Palpations: There is no mass.  Musculoskeletal: Normal range of motion.        General: No swelling or tenderness.  Skin:    General: Skin is warm and dry.  Neurological:     General: No focal deficit present.     Mental Status: She is alert and oriented to person, place, and time.  Psychiatric:        Mood and Affect: Mood normal.        Behavior: Behavior normal.    Vitals:   11/21/18 1536  BP: 130/68  Pulse: 91  Temp: 98 F (36.7 C)  SpO2: 99%   Recent chest x-ray shows no acute infiltrate    Assessment & Plan:  .  Asthma .  Shortness of breath and wheezing  -Notice improvement with use of albuterol -Improvement with a steroid shot  Plan -PFT with methacholine -Albuterol use as needed  -May need to escalate inhaler depending on PFT findings -Avoid known triggers  She will be moving from her current home-where there has been exposure to mold/mildew  Encouraged to call with significant concerns Follow-up in 6 weeks

## 2018-11-21 NOTE — Patient Instructions (Signed)
Wheezing/asthma Improvement noted with a shot of steroids  We will get a PFT/methacholine challenge  Albuterol use as needed  We will see you back in the office in 6 weeks

## 2018-12-11 ENCOUNTER — Encounter: Payer: Self-pay | Admitting: Allergy

## 2018-12-11 ENCOUNTER — Ambulatory Visit (INDEPENDENT_AMBULATORY_CARE_PROVIDER_SITE_OTHER): Payer: 59 | Admitting: Allergy

## 2018-12-11 ENCOUNTER — Telehealth: Payer: Self-pay

## 2018-12-11 ENCOUNTER — Other Ambulatory Visit: Payer: Self-pay

## 2018-12-11 VITALS — BP 112/68 | HR 103 | Temp 98.0°F | Resp 16 | Ht 63.0 in | Wt 175.8 lb

## 2018-12-11 DIAGNOSIS — H1013 Acute atopic conjunctivitis, bilateral: Secondary | ICD-10-CM | POA: Diagnosis not present

## 2018-12-11 DIAGNOSIS — J301 Allergic rhinitis due to pollen: Secondary | ICD-10-CM

## 2018-12-11 DIAGNOSIS — J4540 Moderate persistent asthma, uncomplicated: Secondary | ICD-10-CM | POA: Diagnosis not present

## 2018-12-11 MED ORDER — ALBUTEROL SULFATE HFA 108 (90 BASE) MCG/ACT IN AERS: 2.0000 | INHALATION_SPRAY | RESPIRATORY_TRACT | 1 refills | Status: DC | PRN

## 2018-12-11 MED ORDER — MONTELUKAST SODIUM 10 MG PO TABS: 10.0000 mg | ORAL_TABLET | Freq: Every day | ORAL | 5 refills | Status: DC

## 2018-12-11 MED ORDER — AZELASTINE-FLUTICASONE 137-50 MCG/ACT NA SUSP: 1.0000 | Freq: Two times a day (BID) | NASAL | 5 refills | Status: DC

## 2018-12-11 MED ORDER — OLOPATADINE HCL 0.7 % OP SOLN: 1.0000 [drp] | Freq: Every day | OPHTHALMIC | 5 refills | Status: DC

## 2018-12-11 NOTE — Patient Instructions (Addendum)
Reactive Airway  - history of cough with wheezing that is improved with albuterol use.  Appears to be allergen driven.  - environmental allergy testing is positive to grass pollens, weed pollens.    - would recommend starting Singulair 69m daily at night.  If you note any changes in mood or behavior then stop this medication and let [place].    - have access to albuterol inhaler 2 puffs every 4-6 hours as needed for cough/wheeze/shortness of breath/chest tightness.  May use 15-20 minutes prior to activity.   Monitor frequency of use.    Control goals:   Full participation in all desired activities (may need albuterol before activity)  Albuterol use two time or less a week on average (not counting use with activity)  Cough interfering with sleep two time or less a month  Oral steroids no more than once a year  No hospitalizations  Allergies  - environmental allergy testing is positive to grass pollens and weed pollens  - allergen avoidance measures discussed/handouts provided  - Singulair as above  - trial dymista 1 spray each nostril twice a day.  This is a combination nasal spray with Flonase + Astelin (nasal antihistamine).  This helps with both nasal congestion and drainage.   It looks like this spray is covered for you.  If it is not covered at pharmacy then will need to prescribe Astelin separately.   Hold your Flonase while using Dymista.    - recommend nasal saline rinses (ie. NeilMed rinse kit) as needed and prior to medicated nasal spray use flush out the nose/sinuses  - for itchy/watery/red eyes can use Pazeo 1 drop each eye once a day as needed  Follow-up 3-4 months or sooner if needed

## 2018-12-11 NOTE — Telephone Encounter (Signed)
Allergy Test results given to patient in office.

## 2018-12-11 NOTE — Progress Notes (Signed)
New Patient Note  RE: Caitlyn Roy MRN: [id number] DOB: 1986/03/25 Date of Office Visit: 12/11/2018  Referring provider: Jonathon Jordan, MD Primary care provider: Jonathon Jordan, MD  Chief Complaint: allergies and possible asthma  History of present illness: Caitlyn Roy is a 33 y.o. female presenting today for consultation for allergies and possible asthma.   She states she has been diagnosed with asthma by her PCP last month.  She does states she has noted wheezing more in the mornings.  She states she started coughing around April-June.  Wheezing started around middle of May.  She states the coughing was uncontrollable but has resolved for the most part.   She notes wheezing in the morning after she wakes up and sometimes will have phlegm that she tries to cough out.  She has not identified any particular triggers that worsen symptoms.  She states she did get a steroid injection at PCP visit and it did help for a while and the wheezing and cough resolved but wheezing has now returned.  She has used her son's albuterol inhaler which does improve wheezing.  She has not childhood history of asthma.     She reports having "bad" allergies.  Symptoms include itchy throat, runny nose, cough, watery eyes, nose itch, throat clearing.  Symptoms are worse when it rains.   She will use benadryl to help with sleep.  She has flonase that works 'somewhat' when she uses it.  She has Zaditor that does help with itchy/watery eyes.  She states however she doesn't use the flonase or zaditor much.    No history of eczema or food allergy.   Review of systems: Review of Systems  Constitutional: Negative for chills, fever and malaise/fatigue.  HENT: Positive for congestion. Negative for ear discharge, nosebleeds, sinus pain and sore throat.   Eyes: Negative for pain, discharge and redness.  Respiratory: Positive for cough and wheezing. Negative for sputum production and shortness  of breath.   Cardiovascular: Negative for chest pain.  Gastrointestinal: Negative for abdominal pain, constipation, diarrhea, heartburn, nausea and vomiting.  Musculoskeletal: Negative for joint pain.  Skin: Negative for itching and rash.  Neurological: Negative for headaches.    All other systems negative unless noted above in HPI  Past medical history: Past Medical History:  Diagnosis Date  . Anemia   . Anginal pain ([place])    being followed by cardiologists  . Anxiety   . Depression   . Dysrhythmia   . Fibroid   . H/O cesarean section 2010  . Nausea    d/t to "issues with gallballader"  . 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 Tubal pregnancy   . Vaginal odor   . Vaginitis     Past surgical history: Past Surgical History:  Procedure Laterality Date  . BIOPSY EYE MUSCLE    . CESAREAN SECTION  2010  . CESAREAN SECTION N/A 04/06/2018   Procedure: CESAREAN SECTION bladder irrigation;  Surgeon: [name], MD;  Location: [place];  Service: Obstetrics;  Laterality: N/A;  needs RNFA  . DIAGNOSTIC LAPAROSCOPY WITH REMOVAL OF ECTOPIC PREGNANCY N/A 08/07/2018   Procedure: DIAGNOSTIC LAPAROSCOPY WITH REMOVAL OF ECTOPIC PREGNANCY;  Surgeon: Janyth Pupa, DO;  Location: [place] [facility];  Service: Gynecology;  Laterality: N/A;  . LAPAROSCOPIC APPENDECTOMY N/A 10/20/2016   Procedure: APPENDECTOMY LAPAROSCOPIC;  Surgeon: Judeth Horn, MD;  Location: [place];  Service: General;  Laterality: N/A;  .  LAPAROSCOPIC SALPINGO OOPHERECTOMY Left 08/07/2018   Procedure: LAPAROSCOPIC SALPINGECTOMY LEFT ;  Surgeon: Janyth Pupa, DO;  Location: [place];  Service: Gynecology;  Laterality: Left;    Family history:  Family History  Problem Relation Age of Onset  . Hypertension Mother   . Breast cancer Mother   . Heart attack Mother   . Seizures Mother   . Alcohol abuse Father   . Kidney disease Maternal Grandmother   . Diabetes  Paternal Grandmother     Social history: Lives in an apartment with carpeting with electric heating and central cooling.  No pets in the home.  No concern for roaches in the home.  There is concern for water damage, mildew in the home.   Medication List: Allergies as of 12/11/2018      Reactions   Oxycodone Shortness Of Breath, Itching      Medication List       Accurate as of December 11, 2018  4:34 PM. If you have any questions, ask your nurse or doctor.        albuterol 108 (90 Base) MCG/ACT inhaler Commonly known as: ProAir HFA Inhale 2 puffs into the lungs every 4 (four) hours as needed for wheezing or shortness of breath. Started by: Devontre Siedschlag Charmian Muff, MD   Aviane 0.1-20 MG-MCG tablet Generic drug: levonorgestrel-ethinyl estradiol   Azelastine-Fluticasone 137-50 MCG/ACT Susp Commonly known as: Dymista Place 1 spray into the nose 2 (two) times daily. Started by: Harneet Noblett Charmian Muff, MD   montelukast 10 MG tablet Commonly known as: SINGULAIR Take 1 tablet (10 mg total) by mouth at bedtime. Started by: Eshan Trupiano Charmian Muff, MD   Olopatadine HCl 0.7 % Soln Apply 1 drop to eye daily. Started by: Cornelius Schuitema Charmian Muff, MD       Known medication allergies: Allergies  Allergen Reactions  . Oxycodone Shortness Of Breath and Itching     Physical examination: Blood pressure 112/68, pulse (!) 103, temperature 98 F (36.7 C), temperature source Temporal, resp. rate 16, height _0  (1.6 m), weight 175 lb 12.8 oz (79.7 kg), last menstrual period 12/01/2018, SpO2 98 %, unknown if currently breastfeeding.  General: Alert, interactive, in no acute distress. HEENT: PERRLA, TMs pearly gray, turbinates minimally edematous with clear discharge, post-pharynx non erythematous. Neck: Supple without lymphadenopathy. Lungs: Clear to auscultation without wheezing, rhonchi or rales. {no increased work of breathing. CV: Normal S1, S2 without murmurs. Abdomen:  Nondistended, nontender. Skin: Warm and dry, without lesions or rashes. Extremities:  No clubbing, cyanosis or edema. Neuro:   Grossly intact.  Diagnositics/Labs:  Spirometry: FEV1: 2.26L 87%, FVC: 2.73L 89%, ratio consistent with nonobstructive pattern  Allergy testing: environmental allergy skin prick testing is positive to johnson, perennial rye, timothy, mugwort.  Intradermal testing is negative.  Allergy testing results were read and interpreted by provider, documented by clinical staff.   Assessment and plan:   Reactive Airway Disease  - history of cough with wheezing that is improved with albuterol use.  Appears to be allergen driven.  - environmental allergy testing is positive to grass pollens, weed pollens.    - would recommend starting Singulair 2m daily at night.  If you note any changes in mood or behavior then stop this medication and let [place].    - have access to albuterol inhaler 2 puffs every 4-6 hours as needed for cough/wheeze/shortness of breath/chest tightness.  May use 15-20 minutes prior to activity.   Monitor frequency of use.    Control goals:  Full participation in all desired activities (may need albuterol before activity)  Albuterol use two time or less a week on average (not counting use with activity)  Cough interfering with sleep two time or less a month  Oral steroids no more than once a year  No hospitalizations  Allergic rhinitis with conjunctivitis  - environmental allergy testing is positive to grass pollens and weed pollens  - allergen avoidance measures discussed/handouts provided  - Singulair as above  - trial dymista 1 spray each nostril twice a day.  This is a combination nasal spray with Flonase + Astelin (nasal antihistamine).  This helps with both nasal congestion and drainage.   It looks like this spray is covered for you.  If it is not covered at pharmacy then will need to prescribe Astelin separately.   Hold your Flonase while  using Dymista.    - recommend nasal saline rinses (ie. NeilMed rinse kit) as needed and prior to medicated nasal spray use flush out the nose/sinuses  - for itchy/watery/red eyes can use Pazeo 1 drop each eye once a day as needed  Follow-up 3-4 months or sooner if needed   I appreciate the opportunity to take part in Aylene's care. Please do not hesitate to contact me with questions.  Sincerely,   Prudy Feeler, MD Allergy/Immunology [facility]

## 2019-01-08 ENCOUNTER — Other Ambulatory Visit: Payer: Self-pay | Admitting: Obstetrics & Gynecology

## 2019-01-08 DIAGNOSIS — R1084 Generalized abdominal pain: Secondary | ICD-10-CM

## 2019-01-08 DIAGNOSIS — R140 Abdominal distension (gaseous): Secondary | ICD-10-CM

## 2019-01-15 ENCOUNTER — Other Ambulatory Visit: Payer: 59

## 2019-01-15 ENCOUNTER — Ambulatory Visit
Admission: RE | Admit: 2019-01-15 | Discharge: 2019-01-15 | Disposition: A | Discharge status: Home / Self Care | Payer: 59 | Source: Ambulatory Visit | Attending: Obstetrics & Gynecology | Admitting: Obstetrics & Gynecology

## 2019-01-15 ENCOUNTER — Other Ambulatory Visit: Payer: Self-pay

## 2019-01-15 DIAGNOSIS — R140 Abdominal distension (gaseous): Secondary | ICD-10-CM

## 2019-01-15 DIAGNOSIS — R1084 Generalized abdominal pain: Secondary | ICD-10-CM

## 2019-01-15 MED ORDER — IOPAMIDOL (ISOVUE-300) INJECTION 61%
100.0000 mL | Freq: Once | INTRAVENOUS | Status: AC | PRN
  Administered 2019-01-15: 100 mL via INTRAVENOUS

## 2019-01-24 ENCOUNTER — Telehealth: Payer: Self-pay | Admitting: Pulmonary Disease

## 2019-01-27 NOTE — Telephone Encounter (Signed)
L/m on pt vm to call back to schedule pft -pr  °

## 2019-01-28 NOTE — Telephone Encounter (Signed)
L/m on pt vm to call back to schedule pft - 3rd attempt - closed encounter -pr  °

## 2019-02-20 ENCOUNTER — Ambulatory Visit (INDEPENDENT_AMBULATORY_CARE_PROVIDER_SITE_OTHER): Payer: 59 | Admitting: Family Medicine

## 2019-02-20 ENCOUNTER — Other Ambulatory Visit: Payer: Self-pay

## 2019-02-20 DIAGNOSIS — Z7689 Persons encountering health services in other specified circumstances: Secondary | ICD-10-CM | POA: Diagnosis not present

## 2019-02-20 DIAGNOSIS — R5383 Other fatigue: Secondary | ICD-10-CM

## 2019-02-20 DIAGNOSIS — D509 Iron deficiency anemia, unspecified: Secondary | ICD-10-CM | POA: Diagnosis not present

## 2019-02-20 DIAGNOSIS — Z8639 Personal history of other endocrine, nutritional and metabolic disease: Secondary | ICD-10-CM | POA: Diagnosis not present

## 2019-02-20 NOTE — Progress Notes (Signed)
Virtual Visit via Video Note  I connected with Geraldo Pitter on 02/20/19 at 11:00 AM EDT by a video enabled telemedicine application 2/2 [id number] pandemic and verified that I am speaking with the correct person using two identifiers.  Location patient: home Location provider:work or home office Persons participating in the virtual visit: patient, provider  I discussed the limitations of evaluation and management by telemedicine and the availability of in person appointments. The patient expressed understanding and agreed to proceed.   HPI: Pt is a 33 yo female with questinonable dx of chronic fatigue and h/o anemia.  Pt previously seen at [facility]  Chronic fatigue? -states was "misdiagnosed" -now followed by Dr. Avon Gully -states labs negative for autoimmune d/o -pt states fatigue was worse when anxiety was up -denies current depression or anxiety  -pt was told she may have lupus at one time. But that it was not the cause of her fatigue.  Anemia: -states last checked 3 mo ago. -states in the past iron pills did not help  (Ferrex and another pill) -iron infusion helped while pregnant  H/o vit D def: -not currently on replacement  Past surgical history: Appendectomy c-section x 2 Tubal pregnancy x 2  Social history: Pt works for [employer]. Pt graduated in Dec Magna Cum Laude with a degree in criminal justice.  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  Patient denies tobacco and drug use.  Pt has a 33 mo old daughter and an older child.  Pt in a relationship with Linna Hoff (to be seen by this provider later today).  Family hx: -Biological father is on HD.  ROS: See pertinent positives and negatives per HPI.  Past Medical History:  Diagnosis Date  . Anemia   . Anginal pain ([place])    being followed by cardiologists  . Anxiety   . Depression   . Dysrhythmia   . Fibroid   . H/O  cesarean section 2010  . Nausea    d/t to "issues with gallballader"  . 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 Tubal pregnancy   . Vaginal odor   . Vaginitis     Past Surgical History:  Procedure Laterality Date  . BIOPSY EYE MUSCLE    . CESAREAN SECTION  2010  . CESAREAN SECTION N/A 04/06/2018   Procedure: CESAREAN SECTION bladder irrigation;  Surgeon: [name], MD;  Location: [place];  Service: Obstetrics;  Laterality: N/A;  needs RNFA  . DIAGNOSTIC LAPAROSCOPY WITH REMOVAL OF ECTOPIC PREGNANCY N/A 08/07/2018   Procedure: DIAGNOSTIC LAPAROSCOPY WITH REMOVAL OF ECTOPIC PREGNANCY;  Surgeon: Janyth Pupa, DO;  Location: [place] [facility];  Service: Gynecology;  Laterality: N/A;  . LAPAROSCOPIC APPENDECTOMY N/A 10/20/2016   Procedure: APPENDECTOMY LAPAROSCOPIC;  Surgeon: Judeth Horn, MD;  Location: [place];  Service: General;  Laterality: N/A;  . LAPAROSCOPIC SALPINGO OOPHERECTOMY Left 08/07/2018   Procedure: LAPAROSCOPIC SALPINGECTOMY LEFT ;  Surgeon: Janyth Pupa, DO;  Location: [place];  Service: Gynecology;  Laterality: Left;    Family History  Problem Relation Age of Onset  . Hypertension Mother   . Breast cancer Mother   . Heart attack Mother   . Seizures Mother   . Alcohol abuse Father   . Kidney disease Maternal Grandmother   . Diabetes Paternal Grandmother      Current Outpatient Medications:  .  albuterol (PROAIR HFA) 108 (  90 Base) MCG/ACT inhaler, Inhale 2 puffs into the lungs every 4 (four) hours as needed for wheezing or shortness of breath., Disp: 18 g, Rfl: 1 .  AVIANE 0.1-20 MG-MCG tablet, , Disp: , Rfl:  .  Azelastine-Fluticasone (DYMISTA) 137-50 MCG/ACT SUSP, Place 1 spray into the nose 2 (two) times daily., Disp: 23 g, Rfl: 5 .  montelukast (SINGULAIR) 10 MG tablet, Take 1 tablet (10 mg total) by mouth at bedtime., Disp: 30 tablet, Rfl: 5 .  Olopatadine HCl 0.7 % SOLN, Apply 1 drop to eye  daily., Disp: 2.5 mL, Rfl: 5  EXAM:  VITALS per patient if applicable: RR between 123456 bpm  GENERAL: alert, oriented, appears well and in no acute distress  HEENT: atraumatic, conjunctiva clear, no obvious abnormalities on inspection of external nose and ears  NECK: normal movements of the head and neck  LUNGS: on inspection no signs of respiratory distress, breathing rate appears normal, no obvious gross SOB, gasping or wheezing  CV: no obvious cyanosis  MS: moves all visible extremities without noticeable abnormality  PSYCH/NEURO: pleasant and cooperative, no obvious depression or anxiety, speech and thought processing grossly intact  ASSESSMENT AND PLAN:  Discussed the following assessment and plan:  History of vitamin D deficiency -Discussed rechecking vitamin D level -We will obtain during next OFV.  Iron deficiency anemia, unspecified iron deficiency anemia type -Discussed obtaining CBC and iron levels and next OFV. -Patient given precautions for bleeding, increased fatigue, palpitations, dizziness, etc.  Fatigue, unspecified type -Discussed obtaining previous records -Continue follow-up with Dr. Dossie Der  Encounter to establish care -We reviewed the PMH, PSH, FH, SH, Meds and Allergies. -We provided refills for any medications we will prescribe as needed. -We addressed current concerns per orders and patient instructions. -We have asked for records for pertinent exams, studies, vaccines and notes from previous providers. -We have advised patient to follow up per instructions below.  Follow-up in the next few months as needed for CPE and labs   I discussed the assessment and treatment plan with the patient. The patient was provided an opportunity to ask questions and all were answered. The patient agreed with the plan and demonstrated an understanding of the instructions.   The patient was advised to call back or seek an in-person evaluation if the symptoms worsen or  if the condition fails to improve as anticipated.   Billie Ruddy, MD

## 2019-02-24 ENCOUNTER — Encounter: Payer: Self-pay | Admitting: Family Medicine

## 2019-07-15 ENCOUNTER — Ambulatory Visit: Payer: Self-pay | Admitting: General Surgery

## 2019-07-30 ENCOUNTER — Encounter: Payer: 59 | Admitting: Family Medicine

## 2019-07-30 DIAGNOSIS — Z0289 Encounter for other administrative examinations: Secondary | ICD-10-CM

## 2019-08-07 NOTE — Patient Instructions (Signed)
DUE TO COVID-19 ONLY ONE VISITOR IS ALLOWED TO COME WITH YOU AND STAY IN THE WAITING ROOM ONLY DURING PRE OP AND PROCEDURE DAY OF SURGERY. THE 1 VISITOR MAY VISIT WITH YOU AFTER SURGERY IN YOUR PRIVATE ROOM DURING VISITING HOURS ONLY!  YOU NEED TO HAVE A COVID 19 TEST ON 08-11-19@2 :50 PM, THIS TEST MUST BE DONE BEFORE SURGERY, COME  [name], [name] , [id number].  ([place]) ONCE YOUR COVID TEST IS COMPLETED, PLEASE BEGIN THE QUARANTINE INSTRUCTIONS AS OUTLINED IN YOUR HANDOUT.                Caitlyn Roy  08/07/2019   Your procedure is scheduled on: 08-14-19   Report to [facility] Main  Entrance    Report to Admitting at 10:30 AM     Call this number if you have problems the morning of surgery [phone]    Remember: NO SOLID FOOD AFTER MIDNIGHT THE NIGHT PRIOR TO SURGERY. NOTHING BY MOUTH EXCEPT CLEAR LIQUIDS UNTIL 9:30 AM . PLEASE FINISH ENSURE DRINK PER SURGEON ORDER  WHICH NEEDS TO BE COMPLETED AT 9:30 AM .   CLEAR LIQUID DIET   Foods Allowed                                                                     Foods Excluded  Coffee and tea, regular and decaf                             liquids that you cannot  Plain Jell-O any favor except red or purple                                           see through such as: Fruit ices (not with fruit pulp)                                     milk, soups, orange juice  Iced Popsicles                                    All solid food Carbonated beverages, regular and diet                                    Cranberry, grape and apple juices Sports drinks like Gatorade Lightly seasoned clear broth or consume(fat free) Sugar, honey syrup   _____________________________________________________________________     Take these medicines the morning of surgery with A SIP OF WATER: None. You may use your nasal spray and inhaler as needed.    BRUSH YOUR TEETH MORNING OF SURGERY AND RINSE YOUR MOUTH  OUT, NO CHEWING GUM CANDY OR MINTS.                               You may not  have any metal on your body including hair pins and              piercings     Do not wear jewelry, make-up, lotions, powders or perfumes, deodorant              Do not wear nail polish on your fingernails.  Do not shave  48 hours prior to surgery.     Do not bring valuables to the hospital. [place].  Contacts, dentures or bridgework may not be worn into surgery.       Patients discharged the day of surgery will not be allowed to drive home. IF YOU ARE HAVING SURGERY AND GOING HOME THE SAME DAY, YOU MUST HAVE AN ADULT TO DRIVE YOU HOME AND BE WITH YOU FOR 24 HOURS. YOU MAY GO HOME BY TAXI OR UBER OR ORTHERWISE, BUT AN ADULT MUST ACCOMPANY YOU HOME AND STAY WITH YOU FOR 24 HOURS.  Name and phone number of your driver:  Special Instructions: N/A              Please read over the following fact sheets you were given: _____________________________________________________________________             [facility] - Preparing for Surgery Before surgery, you can play an important role.  Because skin is not sterile, your skin needs to be as free of germs as possible.  You can reduce the number of germs on your skin by washing with CHG (chlorahexidine gluconate) soap before surgery.  CHG is an antiseptic cleaner which kills germs and bonds with the skin to continue killing germs even after washing. Please DO NOT use if you have an allergy to CHG or antibacterial soaps.  If your skin becomes reddened/irritated stop using the CHG and inform your nurse when you arrive at Short Stay. Do not shave (including legs and underarms) for at least 48 hours prior to the first CHG shower.  You may shave your face/neck. Please follow these instructions carefully:  1.  Shower with CHG Soap the night before surgery and the  morning of Surgery.  2.  If you choose to wash your hair,  wash your hair first as usual with your  normal  shampoo.  3.  After you shampoo, rinse your hair and body thoroughly to remove the  shampoo.                           4.  Use CHG as you would any other liquid soap.  You can apply chg directly  to the skin and wash                       Gently with a scrungie or clean washcloth.  5.  Apply the CHG Soap to your body ONLY FROM THE NECK DOWN.   Do not use on face/ open                           Wound or open sores. Avoid contact with eyes, ears mouth and genitals (private parts).                       Wash face,  Genitals (private parts) with your normal soap.  6.  Wash thoroughly, paying special attention to the area where your surgery  will be performed.  7.  Thoroughly rinse your body with warm water from the neck down.  8.  DO NOT shower/wash with your normal soap after using and rinsing off  the CHG Soap.                9.  Pat yourself dry with a clean towel.            10.  Wear clean pajamas.            11.  Place clean sheets on your bed the night of your first shower and do not  sleep with pets. Day of Surgery : Do not apply any lotions/deodorants the morning of surgery.  Please wear clean clothes to the hospital/surgery center.  FAILURE TO FOLLOW THESE INSTRUCTIONS MAY RESULT IN THE CANCELLATION OF YOUR SURGERY PATIENT SIGNATURE_________________________________  NURSE SIGNATURE__________________________________  ________________________________________________________________________

## 2019-08-07 NOTE — Progress Notes (Signed)
PCP - Grier Mitts, MD Cardiologist - Nelva Bush, MD  Chest x-ray - 10-29-18 EKG - 10-29-18  Stress Test -  ECHO - 01-12-17 (FYI) Cardiac Cath -   Sleep Study -  CPAP -   Fasting Blood Sugar -  Checks Blood Sugar _____ times a day  Blood Thinner Instructions: Aspirin Instructions: Last Dose:  Anesthesia review:   Patient denies shortness of breath, fever, cough and chest pain at PAT appointment   Patient verbalized understanding of instructions that were given to them at the PAT appointment. Patient was also instructed that they will need to review over the PAT instructions again at home before surgery.

## 2019-08-11 ENCOUNTER — Other Ambulatory Visit (HOSPITAL_COMMUNITY): Payer: 59

## 2019-08-11 ENCOUNTER — Encounter (HOSPITAL_COMMUNITY): Admission: RE | Admit: 2019-08-11 | Payer: 59 | Source: Ambulatory Visit

## 2019-08-11 ENCOUNTER — Other Ambulatory Visit (HOSPITAL_COMMUNITY)
Admission: RE | Admit: 2019-08-11 | Discharge: 2019-08-11 | Disposition: A | Discharge status: Home / Self Care | Payer: 59 | Source: Ambulatory Visit | Attending: General Surgery | Admitting: General Surgery

## 2019-08-11 ENCOUNTER — Encounter (HOSPITAL_COMMUNITY)
Admission: RE | Admit: 2019-08-11 | Discharge: 2019-08-11 | Disposition: A | Discharge status: Home / Self Care | Payer: 59 | Source: Ambulatory Visit | Attending: General Surgery | Admitting: General Surgery

## 2019-08-11 DIAGNOSIS — Z20822 Contact with and (suspected) exposure to covid-19: Secondary | ICD-10-CM | POA: Diagnosis not present

## 2019-08-11 DIAGNOSIS — Z01812 Encounter for preprocedural laboratory examination: Secondary | ICD-10-CM | POA: Diagnosis present

## 2019-08-11 LAB — SARS CORONAVIRUS 2 (TAT 6-24 HRS): SARS Coronavirus 2: NEGATIVE

## 2019-08-12 ENCOUNTER — Encounter (HOSPITAL_COMMUNITY)
Admission: RE | Admit: 2019-08-12 | Discharge: 2019-08-12 | Disposition: A | Discharge status: Home / Self Care | Payer: 59 | Source: Ambulatory Visit | Attending: General Surgery | Admitting: General Surgery

## 2019-08-12 ENCOUNTER — Other Ambulatory Visit: Payer: Self-pay

## 2019-08-12 ENCOUNTER — Encounter (HOSPITAL_COMMUNITY): Payer: Self-pay

## 2019-08-12 ENCOUNTER — Encounter (HOSPITAL_COMMUNITY): Payer: Self-pay | Admitting: General Surgery

## 2019-08-12 DIAGNOSIS — Z01812 Encounter for preprocedural laboratory examination: Secondary | ICD-10-CM | POA: Insufficient documentation

## 2019-08-12 HISTORY — DX: Personal history of other medical treatment: Z92.89

## 2019-08-12 LAB — COMPREHENSIVE METABOLIC PANEL
ALT: 15 U/L (ref 0–44)
AST: 17 U/L (ref 15–41)
Albumin: 3.9 g/dL (ref 3.5–5.0)
Alkaline Phosphatase: 92 U/L (ref 38–126)
Anion gap: 9 (ref 5–15)
BUN: 10 mg/dL (ref 6–20)
CO2: 27 mmol/L (ref 22–32)
Calcium: 9.4 mg/dL (ref 8.9–10.3)
Chloride: 104 mmol/L (ref 98–111)
Creatinine, Ser: 0.85 mg/dL (ref 0.44–1.00)
GFR calc Af Amer: >60 mL/min (ref >60)
GFR calc non Af Amer: >60 mL/min (ref >60)
Glucose, Bld: 96 mg/dL (ref 70–99)
Potassium: 4.3 mmol/L (ref 3.5–5.1)
Sodium: 140 mmol/L (ref 135–145)
Total Bilirubin: 0.6 mg/dL (ref 0.3–1.2)
Total Protein: 7.9 g/dL (ref 6.5–8.1)

## 2019-08-12 LAB — CBC WITH DIFFERENTIAL/PLATELET
Abs Immature Granulocytes: 0.01 10*3/uL (ref 0.00–0.07)
Basophils Absolute: 0 10*3/uL (ref 0.0–0.1)
Basophils Relative: 0 %
Eosinophils Absolute: 0.2 10*3/uL (ref 0.0–0.5)
Eosinophils Relative: 3 %
HCT: 37.1 % (ref 36.0–46.0)
Hemoglobin: 11.5 g/dL — ABNORMAL LOW (ref 12.0–15.0)
Immature Granulocytes: 0 %
Lymphocytes Relative: 38 %
Lymphs Abs: 2.8 10*3/uL (ref 0.7–4.0)
MCH: 27.1 pg (ref 26.0–34.0)
MCHC: 31 g/dL (ref 30.0–36.0)
MCV: 87.5 fL (ref 80.0–100.0)
Monocytes Absolute: 0.5 10*3/uL (ref 0.1–1.0)
Monocytes Relative: 6 %
Neutro Abs: 3.9 10*3/uL (ref 1.7–7.7)
Neutrophils Relative %: 53 %
Platelets: 412 10*3/uL — ABNORMAL HIGH (ref 150–400)
RBC: 4.24 MIL/uL (ref 3.87–5.11)
RDW: 14 % (ref 11.5–15.5)
WBC: 7.5 10*3/uL (ref 4.0–10.5)
nRBC: 0 % (ref 0.0–0.2)

## 2019-08-12 NOTE — Progress Notes (Addendum)
DUE TO COVID-19 ONLY ONE VISITOR IS ALLOWED TO COME WITH YOU AND STAY IN THE WAITING ROOM ONLY DURING PRE OP AND PROCEDURE DAY OF SURGERY. THE 1 VISITOR MAY VISIT WITH YOU AFTER SURGERY IN YOUR PRIVATE ROOM DURING VISITING HOURS ONLY!  YOU NEED TO HAVE A COVID 19 TEST ON____4/5/21 ___ @_______ , THIS TEST MUST BE DONE BEFORE SURGERY, COME  [place], [place] , [address].  ([place]) ONCE YOUR COVID TEST IS COMPLETED, PLEASE BEGIN THE QUARANTINE INSTRUCTIONS AS OUTLINED IN YOUR HANDOUT.                SHECID WASSMANN  08/12/2019   Your procedure is scheduled on:  08/14/19   Report to [facility] Main  Entrance   Report to admitting at    1030am     Call this number if you have problems the morning of surgery [phone]    Remember: Do not eat food  After midnite.  Marland Kitchen BRUSH YOUR TEETH MORNING OF SURGERY AND RINSE YOUR MOUTH OUT, NO CHEWING GUM CANDY OR MINTS.     Take these medicines the morning of surgery with A SIP OF WATER:  Inhalers as usual and bring, Eye drops if needed, Nasal Spray if needed                    You may not have any metal on your body including hair pins and              piercings  Do not wear jewelry, make-up, lotions, powders or perfumes, deodorant             Do not wear nail polish on your fingernails.  Do not shave  48 hours prior to surgery.              Men may shave face and neck.   Do not bring valuables to the hospital. [place].  Contacts, dentures or bridgework may not be worn into surgery.  Leave suitcase in the car. After surgery it may be brought to your room.     Patients discharged the day of surgery will not be allowed to drive home. IF YOU ARE HAVING SURGERY AND GOING HOME THE SAME DAY, YOU MUST HAVE AN ADULT TO DRIVE YOU HOME AND BE WITH YOU FOR 24 HOURS. YOU MAY GO HOME BY TAXI OR UBER OR ORTHERWISE, BUT AN ADULT MUST ACCOMPANY YOU HOME AND STAY WITH YOU  FOR 24 HOURS.  Name and phone number of your driver:               Please read over the following fact sheets you were given: _____________________________________________________________________             NO SOLID FOOD AFTER MIDNIGHT THE NIGHT PRIOR TO SURGERY. NOTHING BY MOUTH EXCEPT CLEAR LIQUIDS UNTIL  0930am  . PLEASE FINISH ENSURE DRINK PER SURGEON ORDER  WHICH NEEDS TO BE COMPLETED AT 0930am. .   CLEAR LIQUID DIET   Foods Allowed  Foods Excluded  Coffee and tea, regular and decaf                             liquids that you cannot  Plain Jell-O any favor except red or purple                                           see through such as: Fruit ices (not with fruit pulp)                                     milk, soups, orange juice  Iced Popsicles                                    All solid food Carbonated beverages, regular and diet                                    Cranberry, grape and apple juices Sports drinks like Gatorade Lightly seasoned clear broth or consume(fat free) Sugar, honey syrup  Sample Menu Breakfast                                Lunch                                     Supper Cranberry juice                    Beef broth                            Chicken broth Jell-O                                     Grape juice                           Apple juice Coffee or tea                        Jell-O                                      Popsicle                                                Coffee or tea                        Coffee or tea  _____________________________________________________________________  [facility] - Preparing for Surgery Before surgery, you can play an important role.  Because skin is not sterile, your skin  needs to be as free of germs as possible.  You can reduce the number of germs on your skin by washing with CHG (chlorahexidine gluconate) soap before  surgery.  CHG is an antiseptic cleaner which kills germs and bonds with the skin to continue killing germs even after washing. Please DO NOT use if you have an allergy to CHG or antibacterial soaps.  If your skin becomes reddened/irritated stop using the CHG and inform your nurse when you arrive at Short Stay. Do not shave (including legs and underarms) for at least 48 hours prior to the first CHG shower.  You may shave your face/neck. Please follow these instructions carefully:  1.  Shower with CHG Soap the night before surgery and the  morning of Surgery.  2.  If you choose to wash your hair, wash your hair first as usual with your  normal  shampoo.  3.  After you shampoo, rinse your hair and body thoroughly to remove the  shampoo.                           4.  Use CHG as you would any other liquid soap.  You can apply chg directly  to the skin and wash                       Gently with a scrungie or clean washcloth.  5.  Apply the CHG Soap to your body ONLY FROM THE NECK DOWN.   Do not use on face/ open                           Wound or open sores. Avoid contact with eyes, ears mouth and genitals (private parts).                       Wash face,  Genitals (private parts) with your normal soap.             6.  Wash thoroughly, paying special attention to the area where your surgery  will be performed.  7.  Thoroughly rinse your body with warm water from the neck down.  8.  DO NOT shower/wash with your normal soap after using and rinsing off  the CHG Soap.                9.  Pat yourself dry with a clean towel.            10.  Wear clean pajamas.            11.  Place clean sheets on your bed the night of your first shower and do not  sleep with pets. Day of Surgery : Do not apply any lotions/deodorants the morning of surgery.  Please wear clean clothes to the hospital/surgery center.  FAILURE TO FOLLOW THESE INSTRUCTIONS MAY RESULT IN THE CANCELLATION OF YOUR SURGERY PATIENT  SIGNATURE_________________________________  NURSE SIGNATURE__________________________________  ________________________________________________________________________

## 2019-08-14 ENCOUNTER — Ambulatory Visit (HOSPITAL_COMMUNITY)
Admission: RE | Admit: 2019-08-14 | Discharge: 2019-08-14 | Disposition: A | Discharge status: Home / Self Care | Payer: 59 | Attending: General Surgery | Admitting: General Surgery

## 2019-08-14 ENCOUNTER — Encounter (HOSPITAL_COMMUNITY)
Admission: RE | Disposition: A | Discharge status: Home / Self Care | Payer: Self-pay | Source: Home / Self Care | Attending: General Surgery

## 2019-08-14 ENCOUNTER — Encounter (HOSPITAL_COMMUNITY): Payer: Self-pay | Admitting: General Surgery

## 2019-08-14 ENCOUNTER — Telehealth: Payer: Self-pay | Admitting: General Surgery

## 2019-08-14 ENCOUNTER — Other Ambulatory Visit: Payer: Self-pay

## 2019-08-14 ENCOUNTER — Ambulatory Visit (HOSPITAL_COMMUNITY): Payer: 59 | Admitting: Certified Registered Nurse Anesthetist

## 2019-08-14 ENCOUNTER — Ambulatory Visit (HOSPITAL_COMMUNITY): Payer: 59 | Admitting: Physician Assistant

## 2019-08-14 DIAGNOSIS — Z6830 Body mass index (BMI) 30.0-30.9, adult: Secondary | ICD-10-CM | POA: Diagnosis not present

## 2019-08-14 DIAGNOSIS — K8010 Calculus of gallbladder with chronic cholecystitis without obstruction: Secondary | ICD-10-CM | POA: Insufficient documentation

## 2019-08-14 DIAGNOSIS — Z79899 Other long term (current) drug therapy: Secondary | ICD-10-CM | POA: Insufficient documentation

## 2019-08-14 DIAGNOSIS — Q441 Other congenital malformations of gallbladder: Secondary | ICD-10-CM | POA: Diagnosis not present

## 2019-08-14 DIAGNOSIS — E669 Obesity, unspecified: Secondary | ICD-10-CM | POA: Diagnosis not present

## 2019-08-14 DIAGNOSIS — Z880 Allergy status to penicillin: Secondary | ICD-10-CM | POA: Diagnosis not present

## 2019-08-14 HISTORY — PX: CHOLECYSTECTOMY: SHX55

## 2019-08-14 LAB — PREGNANCY, URINE: Preg Test, Ur: NEGATIVE

## 2019-08-14 SURGERY — LAPAROSCOPIC CHOLECYSTECTOMY WITH INTRAOPERATIVE CHOLANGIOGRAM
Anesthesia: General

## 2019-08-14 MED ORDER — ENSURE PRE-SURGERY PO LIQD
296.0000 mL | Freq: Once | ORAL | Status: DC
  Filled 2019-08-14: qty 296

## 2019-08-14 MED ORDER — LIDOCAINE 2% (20 MG/ML) 5 ML SYRINGE
INTRAMUSCULAR | Status: DC | PRN
  Administered 2019-08-14: 1.5 mg/kg/h via INTRAVENOUS

## 2019-08-14 MED ORDER — LACTATED RINGERS IV SOLN: INTRAVENOUS | Status: DC

## 2019-08-14 MED ORDER — ACETAMINOPHEN 500 MG PO TABS
1000.0000 mg | ORAL_TABLET | ORAL | Status: AC
  Administered 2019-08-14: 1000 mg via ORAL

## 2019-08-14 MED ORDER — LACTATED RINGERS IR SOLN
Status: DC | PRN
  Administered 2019-08-14: 1000 mL

## 2019-08-14 MED ORDER — CHLORHEXIDINE GLUCONATE CLOTH 2 % EX PADS: 6.0000 | MEDICATED_PAD | Freq: Once | CUTANEOUS | Status: DC

## 2019-08-14 MED ORDER — OXYCODONE HCL 5 MG PO TABS: 5.0000 mg | ORAL_TABLET | Freq: Four times a day (QID) | ORAL | 0 refills | Status: DC | PRN

## 2019-08-14 MED ORDER — DIPHENHYDRAMINE HCL 50 MG/ML IJ SOLN
12.5000 mg | Freq: Once | INTRAMUSCULAR | Status: AC
  Administered 2019-08-14: 12.5 mg via INTRAVENOUS

## 2019-08-14 MED ORDER — SODIUM CHLORIDE 0.9 % IV SOLN
2.0000 g | INTRAVENOUS | Status: DC
  Filled 2019-08-14: qty 2

## 2019-08-14 MED ORDER — PROPOFOL 10 MG/ML IV BOLUS
INTRAVENOUS | Status: DC | PRN
  Administered 2019-08-14: 140 mg via INTRAVENOUS
  Administered 2019-08-14 (×2): 30 mg via INTRAVENOUS

## 2019-08-14 MED ORDER — MIDAZOLAM HCL 5 MG/5ML IJ SOLN
INTRAMUSCULAR | Status: DC | PRN
  Administered 2019-08-14: 2 mg via INTRAVENOUS

## 2019-08-14 MED ORDER — FENTANYL CITRATE (PF) 100 MCG/2ML IJ SOLN
INTRAMUSCULAR | Status: DC | PRN
  Administered 2019-08-14 (×4): 50 ug via INTRAVENOUS
  Administered 2019-08-14: 100 ug via INTRAVENOUS

## 2019-08-14 MED ORDER — FENTANYL CITRATE (PF) 100 MCG/2ML IJ SOLN
INTRAMUSCULAR | Status: AC
  Administered 2019-08-14: 25 ug via INTRAVENOUS
  Filled 2019-08-14: qty 2

## 2019-08-14 MED ORDER — DIPHENHYDRAMINE HCL 50 MG/ML IJ SOLN
INTRAMUSCULAR | Status: AC
  Filled 2019-08-14: qty 1

## 2019-08-14 MED ORDER — FENTANYL CITRATE (PF) 100 MCG/2ML IJ SOLN
INTRAMUSCULAR | Status: AC
  Filled 2019-08-14: qty 2

## 2019-08-14 MED ORDER — FENTANYL CITRATE (PF) 100 MCG/2ML IJ SOLN
INTRAMUSCULAR | Status: AC
  Administered 2019-08-14: 50 ug via INTRAVENOUS
  Filled 2019-08-14: qty 2

## 2019-08-14 MED ORDER — ROCURONIUM BROMIDE 10 MG/ML (PF) SYRINGE
PREFILLED_SYRINGE | INTRAVENOUS | Status: AC
  Filled 2019-08-14: qty 10

## 2019-08-14 MED ORDER — ESMOLOL HCL 100 MG/10ML IV SOLN
INTRAVENOUS | Status: AC
  Filled 2019-08-14: qty 10

## 2019-08-14 MED ORDER — FENTANYL CITRATE (PF) 250 MCG/5ML IJ SOLN
INTRAMUSCULAR | Status: AC
  Filled 2019-08-14: qty 5

## 2019-08-14 MED ORDER — ROCURONIUM BROMIDE 50 MG/5ML IV SOSY
PREFILLED_SYRINGE | INTRAVENOUS | Status: DC | PRN
  Administered 2019-08-14: 50 mg via INTRAVENOUS

## 2019-08-14 MED ORDER — BUPIVACAINE HCL 0.25 % IJ SOLN
INTRAMUSCULAR | Status: AC
  Filled 2019-08-14: qty 1

## 2019-08-14 MED ORDER — MIDAZOLAM HCL 2 MG/2ML IJ SOLN
INTRAMUSCULAR | Status: AC
  Filled 2019-08-14: qty 2

## 2019-08-14 MED ORDER — LIDOCAINE HCL 2 % IJ SOLN
INTRAMUSCULAR | Status: AC
  Filled 2019-08-14: qty 20

## 2019-08-14 MED ORDER — ESMOLOL HCL 100 MG/10ML IV SOLN
INTRAVENOUS | Status: DC | PRN
  Administered 2019-08-14 (×2): 10 mg via INTRAVENOUS

## 2019-08-14 MED ORDER — CELECOXIB 200 MG PO CAPS
200.0000 mg | ORAL_CAPSULE | Freq: Once | ORAL | Status: AC
  Administered 2019-08-14: 200 mg via ORAL
  Filled 2019-08-14: qty 1

## 2019-08-14 MED ORDER — KETOROLAC TROMETHAMINE 15 MG/ML IJ SOLN
INTRAMUSCULAR | Status: AC
  Filled 2019-08-14: qty 1

## 2019-08-14 MED ORDER — PROMETHAZINE HCL 25 MG/ML IJ SOLN: 6.2500 mg | INTRAMUSCULAR | Status: DC | PRN

## 2019-08-14 MED ORDER — LIDOCAINE 2% (20 MG/ML) 5 ML SYRINGE
INTRAMUSCULAR | Status: AC
  Filled 2019-08-14: qty 5

## 2019-08-14 MED ORDER — BUPIVACAINE HCL (PF) 0.25 % IJ SOLN
INTRAMUSCULAR | Status: DC | PRN
  Administered 2019-08-14: 42 mL

## 2019-08-14 MED ORDER — DEXAMETHASONE SODIUM PHOSPHATE 10 MG/ML IJ SOLN
INTRAMUSCULAR | Status: DC | PRN
  Administered 2019-08-14: 5 mg via INTRAVENOUS

## 2019-08-14 MED ORDER — LIDOCAINE 2% (20 MG/ML) 5 ML SYRINGE
INTRAMUSCULAR | Status: DC | PRN
  Administered 2019-08-14: 60 mg via INTRAVENOUS

## 2019-08-14 MED ORDER — DIPHENHYDRAMINE HCL 50 MG/ML IJ SOLN: 12.5000 mg | Freq: Once | INTRAMUSCULAR | Status: DC

## 2019-08-14 MED ORDER — GABAPENTIN 300 MG PO CAPS
300.0000 mg | ORAL_CAPSULE | ORAL | Status: AC
  Administered 2019-08-14: 300 mg via ORAL
  Filled 2019-08-14: qty 1

## 2019-08-14 MED ORDER — KETOROLAC TROMETHAMINE 15 MG/ML IJ SOLN
15.0000 mg | Freq: Once | INTRAMUSCULAR | Status: AC
  Administered 2019-08-14: 15 mg via INTRAVENOUS

## 2019-08-14 MED ORDER — PROPOFOL 10 MG/ML IV BOLUS
INTRAVENOUS | Status: AC
  Filled 2019-08-14: qty 20

## 2019-08-14 MED ORDER — FENTANYL CITRATE (PF) 100 MCG/2ML IJ SOLN
25.0000 ug | INTRAMUSCULAR | Status: DC | PRN
  Administered 2019-08-14: 50 ug via INTRAVENOUS

## 2019-08-14 MED ORDER — ONDANSETRON HCL 4 MG/2ML IJ SOLN
INTRAMUSCULAR | Status: AC
  Filled 2019-08-14: qty 2

## 2019-08-14 MED ORDER — ACETAMINOPHEN 500 MG PO TABS
1000.0000 mg | ORAL_TABLET | Freq: Once | ORAL | Status: AC
  Filled 2019-08-14: qty 2

## 2019-08-14 MED ORDER — CIPROFLOXACIN IN D5W 400 MG/200ML IV SOLN
INTRAVENOUS | Status: DC | PRN
  Administered 2019-08-14: 400 mg via INTRAVENOUS

## 2019-08-14 MED ORDER — ONDANSETRON HCL 4 MG/2ML IJ SOLN
INTRAMUSCULAR | Status: DC | PRN
  Administered 2019-08-14: 4 mg via INTRAVENOUS

## 2019-08-14 MED ORDER — CIPROFLOXACIN IN D5W 400 MG/200ML IV SOLN
INTRAVENOUS | Status: AC
  Filled 2019-08-14: qty 200

## 2019-08-14 MED ORDER — SUGAMMADEX SODIUM 200 MG/2ML IV SOLN
INTRAVENOUS | Status: DC | PRN
  Administered 2019-08-14: 200 mg via INTRAVENOUS

## 2019-08-14 MED ORDER — SCOPOLAMINE 1 MG/3DAYS TD PT72
1.0000 | MEDICATED_PATCH | TRANSDERMAL | Status: DC
  Administered 2019-08-14: 1.5 mg via TRANSDERMAL
  Filled 2019-08-14: qty 1

## 2019-08-14 SURGICAL SUPPLY — 50 items
APPLICATOR ARISTA FLEXITIP XL (MISCELLANEOUS) IMPLANT
APPLIER CLIP 5 13 M/L LIGAMAX5 (MISCELLANEOUS) ×3
APPLIER CLIP ROT 10 11.4 M/L (STAPLE)
BENZOIN TINCTURE PRP APPL 2/3 (GAUZE/BANDAGES/DRESSINGS) IMPLANT
BNDG ADH 1X3 SHEER STRL LF (GAUZE/BANDAGES/DRESSINGS) ×12 IMPLANT
CABLE HIGH FREQUENCY MONO STRZ (ELECTRODE) ×3 IMPLANT
CHLORAPREP W/TINT 26 (MISCELLANEOUS) ×3 IMPLANT
CLIP APPLIE 5 13 M/L LIGAMAX5 (MISCELLANEOUS) ×1 IMPLANT
CLIP APPLIE ROT 10 11.4 M/L (STAPLE) IMPLANT
CLIP VESOLOCK MED LG 6/CT (CLIP) IMPLANT
CLOSURE WOUND 1/2 X4 (GAUZE/BANDAGES/DRESSINGS)
COVER MAYO STAND STRL (DRAPES) IMPLANT
COVER SURGICAL LIGHT HANDLE (MISCELLANEOUS) ×3 IMPLANT
COVER WAND RF STERILE (DRAPES) IMPLANT
DECANTER SPIKE VIAL GLASS SM (MISCELLANEOUS) ×3 IMPLANT
DERMABOND ADVANCED (GAUZE/BANDAGES/DRESSINGS)
DERMABOND ADVANCED .7 DNX12 (GAUZE/BANDAGES/DRESSINGS) IMPLANT
DRAPE C-ARM 42X120 X-RAY (DRAPES) IMPLANT
DRSG TEGADERM 2-3/8X2-3/4 SM (GAUZE/BANDAGES/DRESSINGS) IMPLANT
ELECT REM PT RETURN 15FT ADLT (MISCELLANEOUS) ×3 IMPLANT
GAUZE SPONGE 2X2 8PLY STRL LF (GAUZE/BANDAGES/DRESSINGS) IMPLANT
GLOVE BIO SURGEON STRL SZ7.5 (GLOVE) ×3 IMPLANT
GLOVE INDICATOR 8.0 STRL GRN (GLOVE) ×18 IMPLANT
GOWN STRL REUS W/TWL XL LVL3 (GOWN DISPOSABLE) ×9 IMPLANT
GRASPER SUT TROCAR 14GX15 (MISCELLANEOUS) IMPLANT
HEMOSTAT ARISTA ABSORB 3G PWDR (HEMOSTASIS) IMPLANT
HEMOSTAT SNOW SURGICEL 2X4 (HEMOSTASIS) ×3 IMPLANT
KIT BASIN OR (CUSTOM PROCEDURE TRAY) ×3 IMPLANT
KIT TURNOVER KIT A (KITS) IMPLANT
L-HOOK LAP DISP 36CM (ELECTROSURGICAL)
LHOOK LAP DISP 36CM (ELECTROSURGICAL) IMPLANT
POUCH RETRIEVAL ECOSAC 10 (ENDOMECHANICALS) ×1 IMPLANT
POUCH RETRIEVAL ECOSAC 10MM (ENDOMECHANICALS) ×2
SCISSORS LAP 5X35 DISP (ENDOMECHANICALS) ×3 IMPLANT
SET CHOLANGIOGRAPH MIX (MISCELLANEOUS) IMPLANT
SET IRRIG TUBING LAPAROSCOPIC (IRRIGATION / IRRIGATOR) ×3 IMPLANT
SET TUBE SMOKE EVAC HIGH FLOW (TUBING) ×3 IMPLANT
SLEEVE XCEL OPT CAN 5 100 (ENDOMECHANICALS) ×6 IMPLANT
SPONGE GAUZE 2X2 STER 10/PKG (GAUZE/BANDAGES/DRESSINGS)
STRIP CLOSURE SKIN 1/2X4 (GAUZE/BANDAGES/DRESSINGS) IMPLANT
SUT MNCRL AB 4-0 PS2 18 (SUTURE) ×3 IMPLANT
SUT VIC AB 0 UR5 27 (SUTURE) IMPLANT
SUT VICRYL 0 TIES 12 18 (SUTURE) IMPLANT
SUT VICRYL 0 UR6 27IN ABS (SUTURE) IMPLANT
TOWEL OR 17X26 10 PK STRL BLUE (TOWEL DISPOSABLE) ×3 IMPLANT
TOWEL OR NON WOVEN STRL DISP B (DISPOSABLE) ×3 IMPLANT
TRAY LAPAROSCOPIC (CUSTOM PROCEDURE TRAY) ×3 IMPLANT
TROCAR BLADELESS OPT 5 100 (ENDOMECHANICALS) ×3 IMPLANT
TROCAR XCEL BLUNT TIP 100MML (ENDOMECHANICALS) IMPLANT
TROCAR XCEL NON-BLD 11X100MML (ENDOMECHANICALS) ×3 IMPLANT

## 2019-08-14 NOTE — Anesthesia Postprocedure Evaluation (Signed)
Anesthesia Post Note  Patient: Caitlyn Roy  Procedure(s) Performed: LAPAROSCOPIC CHOLECYSTECTOMY (N/A )     Patient location during evaluation: PACU Anesthesia Type: General Level of consciousness: sedated Pain management: pain level controlled Vital Signs Assessment: post-procedure vital signs reviewed and stable Respiratory status: spontaneous breathing and respiratory function stable Cardiovascular status: stable Postop Assessment: no apparent nausea or vomiting Anesthetic complications: no    Last Vitals:  Vitals:   08/14/19 1430 08/14/19 1445  BP: (!) 137/95 (!) 111/56  Pulse: 88 68  Resp: 13 13  Temp:    SpO2: 100% 100%    Last Pain:  Vitals:   08/14/19 1445  TempSrc:   PainSc: 7                  [name] DANIEL

## 2019-08-14 NOTE — H&P (Signed)
Caitlyn Roy is an 34 y.o. female.   Chief Complaint: here for surgery HPI: 34yo female presents for lap chole. No changes since seen in clinic  The patient is a 34 year old female who presents for evaluation of gall stones. She comes in today to discuss her abdominal pain. She is referred by Dr. Therisa Doyne for probable symptomatic cholelithiasis. She last saw [place] in 2018 in follow-up for her acute appendicitis. She underwent lap appendectomy by Dr. Hulen Skains. She then complained of some epigastric and upper abdominal pain. She had known gallstones at that time. She underwent a nuclear medicine scan in July 2018 which was unremarkable. She states that she generally has intermittent episodes. She describes it as stomach pain. She will 0.2 her upper abdomen and around her umbilicus. She'll generally have nausea with it. The episodes generally last 2-3 hours. She hasn't found anything that makes it better. She had a recent event the other week where she had 3 consecutive nights of what she describes as severe abdominal pain associated with multiple episodes of nausea and vomiting. These episodes occurred in the middle the night but ultimately subsided. She did have some radiation to her back with the severe episodes. She has had a diagnostic laparoscopy for an ectopic pregnancy in April 2020. I reviewed that operative note. There is normal-appearing gallbladder and no mention of adhesions in the upper abdomen. The anterior wall of the uterus had a thin adhesion to the lower abdominal wall. She had a CT scan in September 2020 which showed gallstones which I reviewed. I also reviewed the referring doctor's office notes from February 3. I also reviewed Dr. Richarda Blade office note from 2018. She denies any melena or hematochezia. She denies any frequent NSAID use. She has some heartburn on a chronic basis but this pain is different she states. She denies any weight loss. She denies any fevers or  chills  Past Medical History:  Diagnosis Date  . Anemia   . Anginal pain ([place])    being followed by cardiologists  . Anxiety   . Depression   . Dysrhythmia    palpitation   . Fibroid   . H/O cesarean section 2010  . History of blood transfusion   . Nausea    d/t to "issues with gallballader"  . 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 Tubal pregnancy   . Vaginal odor   . Vaginitis     Past Surgical History:  Procedure Laterality Date  . BIOPSY EYE MUSCLE    . CESAREAN SECTION  2010  . CESAREAN SECTION N/A 04/06/2018   Procedure: CESAREAN SECTION bladder irrigation;  Surgeon: [name], MD;  Location: [place];  Service: Obstetrics;  Laterality: N/A;  needs RNFA  . DIAGNOSTIC LAPAROSCOPY WITH REMOVAL OF ECTOPIC PREGNANCY N/A 08/07/2018   Procedure: DIAGNOSTIC LAPAROSCOPY WITH REMOVAL OF ECTOPIC PREGNANCY;  Surgeon: Janyth Pupa, DO;  Location: [place];  Service: Gynecology;  Laterality: N/A;  . LAPAROSCOPIC APPENDECTOMY N/A 10/20/2016   Procedure: APPENDECTOMY LAPAROSCOPIC;  Surgeon: Judeth Horn, MD;  Location: [facility];  Service: General;  Laterality: N/A;  . LAPAROSCOPIC SALPINGO OOPHERECTOMY Left 08/07/2018   Procedure: LAPAROSCOPIC SALPINGECTOMY LEFT ;  Surgeon: Janyth Pupa, DO;  Location: [facility];  Service: Gynecology;  Laterality: Left;    Family History  Problem Relation Age of Onset  . Hypertension Mother   . Breast cancer Mother   . Heart attack Mother   .  Seizures Mother   . Alcohol abuse Father   . Kidney disease Maternal Grandmother   . Diabetes Paternal Grandmother    Social History:  reports that she has never smoked. She has never used smokeless tobacco. She reports current alcohol use. She reports that she does not use drugs.  Allergies:  Allergies  Allergen Reactions  . Penicillins Hives and Shortness Of Breath    Did it involve swelling of the face/tongue/throat, SOB, or low  BP? Yes Did it involve sudden or severe rash/hives, skin peeling, or any reaction on the inside of your mouth or nose? No Did Roy need to seek medical attention at a hospital or doctor's office? No When did it last happen?2020 If all above answers are "NO", may proceed with cephalosporin use.     Medications Prior to Admission  Medication Sig Dispense Refill  . albuterol (PROAIR HFA) 108 (90 Base) MCG/ACT inhaler Inhale 2 puffs into the lungs every 4 (four) hours as needed for wheezing or shortness of breath. 18 g 1  . fluticasone (FLONASE) 50 MCG/ACT nasal spray Place 1 spray into both nostrils daily as needed for allergies or rhinitis.    Marland Kitchen ketotifen (ZADITOR) 0.025 % ophthalmic solution Place 1 drop into both eyes 2 (two) times daily as needed (allergies).    . norelgestromin-ethinyl estradiol (ORTHO EVRA) 150-35 MCG/24HR transdermal patch Place 1 patch onto the skin once a week.    . Azelastine-Fluticasone (DYMISTA) 137-50 MCG/ACT SUSP Place 1 spray into the nose 2 (two) times daily. (Patient not taking: Reported on 08/06/2019) 23 g 5  . montelukast (SINGULAIR) 10 MG tablet Take 1 tablet (10 mg total) by mouth at bedtime. (Patient not taking: Reported on 08/06/2019) 30 tablet 5  . Olopatadine HCl 0.7 % SOLN Apply 1 drop to eye daily. (Patient not taking: Reported on 08/06/2019) 2.5 mL 5    Results for orders placed or performed during the hospital encounter of 08/14/19 (from the past 48 hour(s))  Pregnancy, urine     Status: None   Collection Time: 08/14/19 10:45 AM  Result Value Ref Range   Preg Test, Ur NEGATIVE NEGATIVE    Comment:        THE SENSITIVITY OF THIS METHODOLOGY IS >20 mIU/mL. Performed at [facility], [place] [address] [place], [place] [address]    No results found.  Review of Systems  All other systems reviewed and are negative.   Blood pressure 130/87, pulse 76, temperature 99.3 F (37.4 C), temperature source Oral, resp. rate 16, height  5\' 2"  (1.575 m), weight 76.4 kg, last menstrual period 07/21/2019, SpO2 100 %, not currently breastfeeding. Physical Exam  Vitals reviewed. Constitutional: She is oriented to person, place, and time. She appears well-developed and well-nourished. No distress.  HENT:  Head: Normocephalic and atraumatic.  Right Ear: External ear normal.  Left Ear: External ear normal.  Eyes: Conjunctivae are normal. No scleral icterus.  Neck: No tracheal deviation present. No thyromegaly present.  Cardiovascular: Normal rate and normal heart sounds.  Respiratory: Effort normal and breath sounds normal. No stridor. No respiratory distress. She has no wheezes.  GI: Soft. She exhibits no distension. There is no abdominal tenderness.  Musculoskeletal:        General: No tenderness or edema.     Cervical back: Normal range of motion and neck supple.  Lymphadenopathy:    She has no cervical adenopathy.  Neurological: She is alert and oriented to person, place, and time. She exhibits normal  muscle tone.  Skin: Skin is warm and dry. No rash noted. She is not diaphoretic. No erythema. No pallor.  Psychiatric: She has a normal mood and affect. Her behavior is normal. Judgment and thought content normal.     Assessment/Plan Symptomatic cholelithiasis  To OR for lap chole poss ioc All questions asked and answered ERAS IV abx  Leighton Ruff. Redmond Pulling, MD, FACS General, Bariatric, & Minimally Invasive Surgery [facility], [place]   Greer Pickerel, MD 08/14/2019, 12:21 PM

## 2019-08-14 NOTE — Transfer of Care (Signed)
Immediate Anesthesia Transfer of Care Note  Patient: Caitlyn Roy  Procedure(s) Performed: LAPAROSCOPIC CHOLECYSTECTOMY (N/A )  Patient Location: PACU  Anesthesia Type:General  Level of Consciousness: drowsy and patient cooperative  Airway & Oxygen Therapy: Patient Spontanous Breathing and Patient connected to face mask oxygen  Post-op Assessment: Report given to RN and Post -op Vital signs reviewed and stable  Post vital signs: Reviewed and stable  Last Vitals:  Vitals Value Taken Time  BP    Temp    Pulse    Resp    SpO2      Last Pain:  Vitals:   08/14/19 1121  TempSrc:   PainSc: 0-No pain      Patients Stated Pain Goal: 4 ([id number])  Complications: No apparent anesthesia complications

## 2019-08-14 NOTE — Anesthesia Preprocedure Evaluation (Signed)
Anesthesia Evaluation    Reviewed: Allergy & Precautions, Patient's Chart, lab work & pertinent test results  Airway Mallampati: II  TM Distance: >3 FB Neck ROM: Full    Dental  (+) Dental Advisory Given, Chipped, Poor Dentition, Caps   Pulmonary neg pulmonary ROS,    Pulmonary exam normal breath sounds clear to auscultation       Cardiovascular negative cardio ROS   Rhythm:Regular Rate:Tachycardia  Study Conclusions   - Left ventricle: The cavity size was normal. There was mild  concentric hypertrophy. Systolic function was normal. The  estimated ejection fraction was in the range of 60% to 65%. Wall  motion was normal; there were no regional wall motion  abnormalities. Left ventricular diastolic function parameters  were normal.  - Aortic valve: Trileaflet; normal thickness, mildly calcified  leaflets.  - Mitral valve: Mild focal calcification of the anterior leaflet.    Neuro/Psych PSYCHIATRIC DISORDERS Anxiety Depression H/o orbital pseudotumor negative neurological ROS     GI/Hepatic Neg liver ROS,   Endo/Other  Obesity   Renal/GU negative Renal ROS     Musculoskeletal negative musculoskeletal ROS (+)   Abdominal   Peds  Hematology  (+) Blood dyscrasia, anemia ,   Anesthesia Other Findings Day of surgery medications reviewed with the patient.  Reproductive/Obstetrics (+) Pregnancy +Ectopic pregnancy                              Anesthesia Physical  Anesthesia Plan  ASA: II  Anesthesia Plan: General   Post-op Pain Management:    Induction: Intravenous  PONV Risk Score and Plan: 4 or greater and Midazolam, Dexamethasone, Ondansetron and Scopolamine patch - Pre-op  Airway Management Planned: Oral ETT  Additional Equipment:   Intra-op Plan:   Post-operative Plan: Extubation in OR  Informed Consent: I have reviewed the patients History and Physical,  chart, labs and discussed the procedure including the risks, benefits and alternatives for the proposed anesthesia with the patient or authorized representative who has indicated his/her understanding and acceptance.     Dental advisory given  Plan Discussed with: CRNA and Anesthesiologist  Anesthesia Plan Comments:         Anesthesia Quick Evaluation

## 2019-08-14 NOTE — Telephone Encounter (Signed)
Received page from answering service that patient stated that [facility] did not have her pain medication in stock and she was requesting that her pain medication prescription be redirected to [facility].  I contacted [facility] and verified that they in fact did not have any more oxycodone in stock.  They canceled that prescription.  I contacted patient and informed her that I would send her oxycodone prescription to [facility] at the corner of [address].  Patient voiced understanding

## 2019-08-14 NOTE — Progress Notes (Signed)
Late addendum:  Pt reports persistent itching and pain after 120mcg fentanyl, 15 mg toradol and 12.5 mg benadryl (all IV).  Dr sSinger notifed and instructed RN to give additional Benadryl and advise pt that pt can have additional narcotics but the narcotics will cause itching.  Pt requests additional dose of fentanyl and benadryl.  Pt verbalized desire to go home and acknowledged she anticipates pai.

## 2019-08-14 NOTE — Discharge Instructions (Signed)
[facility], P.A[facility] LAPAROSCOPIC SURGERY: POST OP INSTRUCTIONS Always review your discharge instruction sheet given to you by the facility where your surgery was performed. IF YOU HAVE DISABILITY OR FAMILY LEAVE FORMS, YOU MUST BRING THEM TO THE OFFICE FOR PROCESSING.   DO NOT GIVE THEM TO YOUR DOCTOR.  PAIN CONTROL  1. First take acetaminophen (Tylenol) AND/or ibuprofen (Advil) to control your pain after surgery.  Follow directions on package.  Taking acetaminophen (Tylenol) and/or ibuprofen (Advil) regularly after surgery will help to control your pain and lower the amount of prescription pain medication you may need.  You should not take more than 3,000 mg (3 grams) of acetaminophen (Tylenol) in 24 hours.  You should not take ibuprofen (Advil), aleve, motrin, naprosyn or other NSAIDS if you have a history of stomach ulcers or chronic kidney disease.  2. A prescription for pain medication may be given to you upon discharge.  Take your pain medication as prescribed, if you still have uncontrolled pain after taking acetaminophen (Tylenol) or ibuprofen (Advil). 3. Use ice packs to help control pain. 4. If you need a refill on your pain medication, please contact your pharmacy.  They will contact our office to request authorization. Prescriptions will not be filled after 5pm or on week-ends.  HOME MEDICATIONS 5. Take your usually prescribed medications unless otherwise directed.  DIET 6. You should follow a light diet the first few days after arrival home.  Be sure to include lots of fluids daily. Avoid fatty, fried foods.   CONSTIPATION 7. It is common to experience some constipation after surgery and if you are taking pain medication.  Increasing fluid intake and taking a stool softener (such as Colace) will usually help or prevent this problem from occurring.  A mild laxative (Milk of Magnesia or Miralax) should be taken according to package instructions if there are no bowel  movements after 48 hours.  WOUND/INCISION CARE 8. Most patients will experience some swelling and bruising in the area of the incisions.  Ice packs will help.  Swelling and bruising can take several days to resolve.  9. Unless discharge instructions indicate otherwise, follow guidelines below  a. STERI-STRIPS - you may remove your outer bandages 48 hours after surgery, and you may shower at that time.  You have steri-strips (small skin tapes) in place directly over the incision.  These strips should be left on the skin for 7-10 days.   b. DERMABOND/SKIN GLUE - you may shower in 24 hours.  The glue will flake off over the next 2-3 weeks. 10. Any sutures or staples will be removed at the office during your follow-up visit.  ACTIVITIES 11. You may resume regular (light) daily activities beginning the next day--such as daily self-care, walking, climbing stairs--gradually increasing activities as tolerated.  You may have sexual intercourse when it is comfortable.  Refrain from any heavy lifting or straining until approved by your doctor. a. You may drive when you are no longer taking prescription pain medication, you can comfortably wear a seatbelt, and you can safely maneuver your car and apply brakes.  FOLLOW-UP 12. You should see your doctor in the office for a follow-up appointment approximately 2-3 weeks after your surgery.  You should have been given your post-op/follow-up appointment when your surgery was scheduled.  If you did not receive a post-op/follow-up appointment, make sure that you call for this appointment within a day or two after you arrive home to insure a convenient appointment time.  OTHER  INSTRUCTIONS 13.   WHEN TO CALL YOUR DOCTOR: 1. Fever over 101.0 2. Inability to urinate 3. Continued bleeding from incision. 4. Increased pain, redness, or drainage from the incision. 5. Increasing abdominal pain  The clinic staff is available to answer your questions during regular  business hours.  Please don't hesitate to call and ask to speak to one of the nurses for clinical concerns.  If you have a medical emergency, go to the nearest emergency room or call 911.  A surgeon from [facility] is always on call at the hospital. [address], [place], [place], [place]  [address] ? [address], [place], [place]   [address] [phone] ? [phone] ? FAX [phone] Web site: [email or web address]  ........Marland Kitchen   Managing Your Pain After Surgery Without Opioids    Thank you for participating in our program to help patients manage their pain after surgery without opioids. This is part of our effort to provide you with the best care possible, without exposing you or your family to the risk that opioids pose.  What pain can I expect after surgery? You can expect to have some pain after surgery. This is normal. The pain is typically worse the day after surgery, and quickly begins to get better. Many studies have found that many patients are able to manage their pain after surgery with Over-the-Counter (OTC) medications such as Tylenol and Motrin. If you have a condition that does not allow you to take Tylenol or Motrin, notify your surgical team.  How will I manage my pain? The best strategy for controlling your pain after surgery is around the clock pain control with Tylenol (acetaminophen) and Motrin (ibuprofen or Advil). Alternating these medications with each other allows you to maximize your pain control. In addition to Tylenol and Motrin, you can use heating pads or ice packs on your incisions to help reduce your pain.  How will I alternate your regular strength over-the-counter pain medication? You will take a dose of pain medication every three hours. ; Start by taking 650 mg of Tylenol (2 pills of 325 mg) ; 3 hours later take 600 mg of Motrin (3 pills of 200 mg) ; 3 hours after taking the Motrin take 650 mg of Tylenol ; 3 hours  after that take 600 mg of Motrin.   - 1 -  See example - if your first dose of Tylenol is at 12:00 PM   12:00 PM Tylenol 650 mg (2 pills of 325 mg)  3:00 PM Motrin 600 mg (3 pills of 200 mg)  6:00 PM Tylenol 650 mg (2 pills of 325 mg)  9:00 PM Motrin 600 mg (3 pills of 200 mg)  Continue alternating every 3 hours   We recommend that you follow this schedule around-the-clock for at least 3 days after surgery, or until you feel that it is no longer needed. Use the table on the last page of this handout to keep track of the medications you are taking. Important: Do not take more than 3000mg  of Tylenol or 1800mg  of Motrin in a 24-hour period. Do not take ibuprofen/Motrin if you have a history of bleeding stomach ulcers, severe kidney disease, &/or actively taking a blood thinner  What if I still have pain? If you have pain that is not controlled with the over-the-counter pain medications (Tylenol and Motrin or Advil) you might have what we call "breakthrough" pain. You will receive a prescription for a small amount of an opioid  pain medication such as Oxycodone, Tramadol, or Tylenol with Codeine. Use these opioid pills in the first 24 hours after surgery if you have breakthrough pain. Do not take more than 1 pill every 4-6 hours.  If you still have uncontrolled pain after using all opioid pills, don't hesitate to call our staff using the number provided. We will help make sure you are managing your pain in the best way possible, and if necessary, we can provide a prescription for additional pain medication.   Day 1    Time  Name of Medication Number of pills taken  Amount of Acetaminophen  Pain Level   Comments  AM PM       AM PM       AM PM       AM PM       AM PM       AM PM       AM PM       AM PM       Total Daily amount of Acetaminophen Do not take more than  3,000 mg per day      Day 2    Time  Name of Medication Number of pills taken  Amount of Acetaminophen  Pain  Level   Comments  AM PM       AM PM       AM PM       AM PM       AM PM       AM PM       AM PM       AM PM       Total Daily amount of Acetaminophen Do not take more than  3,000 mg per day      Day 3    Time  Name of Medication Number of pills taken  Amount of Acetaminophen  Pain Level   Comments  AM PM       AM PM       AM PM       AM PM          AM PM       AM PM       AM PM       AM PM       Total Daily amount of Acetaminophen Do not take more than  3,000 mg per day      Day 4    Time  Name of Medication Number of pills taken  Amount of Acetaminophen  Pain Level   Comments  AM PM       AM PM       AM PM       AM PM       AM PM       AM PM       AM PM       AM PM       Total Daily amount of Acetaminophen Do not take more than  3,000 mg per day      Day 5    Time  Name of Medication Number of pills taken  Amount of Acetaminophen  Pain Level   Comments  AM PM       AM PM       AM PM       AM PM       AM PM       AM PM  AM PM       AM PM       Total Daily amount of Acetaminophen Do not take more than  3,000 mg per day       Day 6    Time  Name of Medication Number of pills taken  Amount of Acetaminophen  Pain Level  Comments  AM PM       AM PM       AM PM       AM PM       AM PM       AM PM       AM PM       AM PM       Total Daily amount of Acetaminophen Do not take more than  3,000 mg per day      Day 7    Time  Name of Medication Number of pills taken  Amount of Acetaminophen  Pain Level   Comments  AM PM       AM PM       AM PM       AM PM       AM PM       AM PM       AM PM       AM PM       Total Daily amount of Acetaminophen Do not take more than  3,000 mg per day        For additional information about how and where to safely dispose of unused opioid medications - https://www.morepowerfulnc.org  Disclaimer: This document contains information and/or instructional materials adapted  from [facility] for the typical patient with your condition. It does not replace medical advice from your health care provider because your experience may differ from that of the typical patient. Talk to your health care provider if you have any questions about this document, your condition or your treatment plan. Adapted from [facility]   

## 2019-08-14 NOTE — Op Note (Signed)
Caitlyn Roy [id number] May 23, 1985 08/14/2019  Laparoscopic Cholecystectomy Procedure Note  Indications: This patient presents with symptomatic gallbladder disease and will undergo laparoscopic cholecystectomy.  Pre-operative Diagnosis: symptomatic cholelithiasis  Post-operative Diagnosis: Same (intrahepatic gallbladder)  Surgeon: Greer Pickerel MD FACS  Assistants: non3  Anesthesia: General endotracheal anesthesia  Procedure Details  The patient was seen again in the Holding Room. The risks, benefits, complications, treatment options, and expected outcomes were discussed with the patient. The possibilities of reaction to medication, pulmonary aspiration, perforation of viscus, bleeding, recurrent infection, finding a normal gallbladder, the need for additional procedures, failure to diagnose a condition, the possible need to convert to an open procedure, and creating a complication requiring transfusion or operation were discussed with the patient. The likelihood of improving the patient's symptoms with return to their baseline status is good.  The patient and/or family concurred with the proposed plan, giving informed consent. The site of surgery properly noted. The patient was taken to Operating Room, identified as Caitlyn Roy and the procedure verified as Laparoscopic Cholecystectomy. A Time Out was held and the above information confirmed. Antibiotic prophylaxis was administered.   Prior to the induction of general anesthesia, antibiotic prophylaxis was administered. General endotracheal anesthesia was then administered and tolerated well. After the induction, the abdomen was prepped with Chloraprep and draped in the sterile fashion. The patient was positioned in the supine position.  Local anesthetic agent was injected into the skin near the umbilicus and an incision made thru an old infraumbilical incision. We dissected down to the abdominal fascia with blunt dissection.  The  fascia was incised vertically and we entered the peritoneal cavity bluntly.  A pursestring suture of 0-Vicryl was placed around the fascial opening.  The Hasson cannula was inserted and secured with the stay suture.  Pneumoperitoneum was then created with CO2 and tolerated well without any adverse changes in the patient's vital signs. An 5-mm port was placed in the subxiphoid position.  Two 5-mm ports were placed in the right upper quadrant. All skin incisions were infiltrated with a local anesthetic agent before making the incision and placing the trocars.   We positioned the patient in reverse Trendelenburg, tilted slightly to the patient's left.  The gallbladder was identified, the fundus grasped and retracted cephalad.  Patient had an intrahepatic gallbladder.  A portion of liver was surrounding the infundibulum. adhesions were lysed bluntly and with the electrocautery where indicated, taking care not to injure any adjacent organs or viscus. The infundibulum was grasped and retracted laterally, exposing the peritoneum overlying the triangle of Calot.  Because a portion of the liver was loosely surrounding the infundibulum I decided to mobilize some of the lateral wall of the gallbladder in hopes that would allow me to retract the gallbladder a little bit more to expose more of infundibulum since it was partially obscured by portions of the liver. In taking some the lateral peritoneum along the body of gallbladder with cautery there was some bleeding in this area. Hemostasis was achieved with cautery and clipped.  This allowed more visualization of infundibulum and this was dissected with the [place]. A critical view of the cystic duct and cystic artery was obtained.  The cystic duct was clearly identified and bluntly dissected circumferentially.  There were no other structures entering the gallbladder.  The cystic duct was then ligated with clips and divided. The cystic artery which had been  identified & dissected free was ligated with clips and divided as well.  It  was a little bit of bleeding noted beneath the clip securing the cystic artery.  I ended up placing additional clip just below it.  There is no further evidence of bleeding.  The gallbladder was dissected from the liver bed in retrograde fashion with the electrocautery. There was some spillage of the bile from the gallbladder but no stone spillage. The gallbladder was removed and placed in an Ecco sac.  The gallbladder and Ecco sac were then removed through the umbilical port site. The liver bed was irrigated and inspected. Hemostasis was achieved with the electrocautery. Copious irrigation was utilized and was repeatedly aspirated until clear.  Snow placed in the gallbladder fossa. The pursestring suture was used to close the umbilical fascia.  addl local was infiltrated around the umbilical fascial closure and around the right lateral abdominal wall.  We again inspected the right upper quadrant for hemostasis.  The umbilical closure was inspected and there was no air leak and nothing trapped within the closure. Pneumoperitoneum was released as we removed the trocars.  4-0 Monocryl was used to close the skin.   Dermabond was applied. The patient was then extubated and brought to the recovery room in stable condition. Instrument, sponge, and needle counts were correct at closure and at the conclusion of the case.   Findings: Intrahepatic gallbladder, +Snow, Cholelithiasis  Estimated Blood Loss: 31ml         Drains: none         Specimens: Gallbladder           Complications: None; patient tolerated the procedure well.         Disposition: PACU - hemodynamically stable.         Condition: stable  Leighton Ruff. Redmond Pulling, MD, FACS General, Bariatric, & Minimally Invasive Surgery [facility], [place]

## 2019-08-14 NOTE — Anesthesia Procedure Notes (Signed)
Procedure Name: Intubation Date/Time: 08/14/2019 12:41 PM Performed by: Montel Clock, CRNA Pre-anesthesia Checklist: Patient identified, Emergency Drugs available, Suction available, Patient being monitored and Timeout performed Patient Re-evaluated:Patient Re-evaluated prior to induction Oxygen Delivery Method: Circle system utilized Preoxygenation: Pre-oxygenation with 100% oxygen Induction Type: IV induction Ventilation: Mask ventilation without difficulty Laryngoscope Size: Mac and 3 Grade View: Grade II Tube type: Oral Tube size: 7.0 mm Number of attempts: 1 Airway Equipment and Method: Stylet Placement Confirmation: ETT inserted through vocal cords under direct vision,  positive ETCO2 and breath sounds checked- equal and bilateral Secured at: 21 cm Tube secured with: Tape Dental Injury: Teeth and Oropharynx as per pre-operative assessment

## 2019-08-15 ENCOUNTER — Encounter: Payer: Self-pay | Admitting: *Deleted

## 2019-08-18 LAB — SURGICAL PATHOLOGY

## 2019-09-03 ENCOUNTER — Other Ambulatory Visit: Payer: Self-pay

## 2019-09-04 ENCOUNTER — Ambulatory Visit (INDEPENDENT_AMBULATORY_CARE_PROVIDER_SITE_OTHER): Payer: 59 | Admitting: Family Medicine

## 2019-09-04 ENCOUNTER — Encounter: Payer: Self-pay | Admitting: Family Medicine

## 2019-09-04 VITALS — BP 110/78 | HR 92 | Temp 97.9°F | Wt 165.0 lb

## 2019-09-04 DIAGNOSIS — R253 Fasciculation: Secondary | ICD-10-CM | POA: Diagnosis not present

## 2019-09-04 DIAGNOSIS — R4586 Emotional lability: Secondary | ICD-10-CM

## 2019-09-04 DIAGNOSIS — Z9049 Acquired absence of other specified parts of digestive tract: Secondary | ICD-10-CM

## 2019-09-04 DIAGNOSIS — E559 Vitamin D deficiency, unspecified: Secondary | ICD-10-CM | POA: Diagnosis not present

## 2019-09-04 DIAGNOSIS — Z0000 Encounter for general adult medical examination without abnormal findings: Secondary | ICD-10-CM

## 2019-09-04 DIAGNOSIS — R5383 Other fatigue: Secondary | ICD-10-CM | POA: Diagnosis not present

## 2019-09-04 DIAGNOSIS — Z0001 Encounter for general adult medical examination with abnormal findings: Secondary | ICD-10-CM

## 2019-09-04 LAB — IRON, TOTAL/TOTAL IRON BINDING CAP
%SAT: 15 % (calc) — ABNORMAL LOW (ref 16–45)
Iron: 57 ug/dL (ref 40–190)
TIBC: 383 mcg/dL (calc) (ref 250–450)

## 2019-09-04 NOTE — Patient Instructions (Signed)
Preventive Care 21-34 Years Old, Female Preventive care refers to visits with your health care provider and lifestyle choices that can promote health and wellness. This includes:  A yearly physical exam. This may also be called an annual well check.  Regular dental visits and eye exams.  Immunizations.  Screening for certain conditions.  Healthy lifestyle choices, such as eating a healthy diet, getting regular exercise, not using drugs or products that contain nicotine and tobacco, and limiting alcohol use. What can I expect for my preventive care visit? Physical exam Your health care provider will check your:  Height and weight. This may be used to calculate body mass index (BMI), which tells if you are at a healthy weight.  Heart rate and blood pressure.  Skin for abnormal spots. Counseling Your health care provider may ask you questions about your:  Alcohol, tobacco, and drug use.  Emotional well-being.  Home and relationship well-being.  Sexual activity.  Eating habits.  Work and work environment.  Method of birth control.  Menstrual cycle.  Pregnancy history. What immunizations do I need?  Influenza (flu) vaccine  This is recommended every year. Tetanus, diphtheria, and pertussis (Tdap) vaccine  You may need a Td booster every 10 years. Varicella (chickenpox) vaccine  You may need this if you have not been vaccinated. Human papillomavirus (HPV) vaccine  If recommended by your health care provider, you may need three doses over 6 months. Measles, mumps, and rubella (MMR) vaccine  You may need at least one dose of MMR. You may also need a second dose. Meningococcal conjugate (MenACWY) vaccine  One dose is recommended if you are age 19-21 years and a first-year college student living in a residence hall, or if you have one of several medical conditions. You may also need additional booster doses. Pneumococcal conjugate (PCV13) vaccine  You may need  this if you have certain conditions and were not previously vaccinated. Pneumococcal polysaccharide (PPSV23) vaccine  You may need one or two doses if you smoke cigarettes or if you have certain conditions. Hepatitis A vaccine  You may need this if you have certain conditions or if you travel or work in places where you may be exposed to hepatitis A. Hepatitis B vaccine  You may need this if you have certain conditions or if you travel or work in places where you may be exposed to hepatitis B. Haemophilus influenzae type b (Hib) vaccine  You may need this if you have certain conditions. You may receive vaccines as individual doses or as more than one vaccine together in one shot (combination vaccines). Talk with your health care provider about the risks and benefits of combination vaccines. What tests do I need?  Blood tests  Lipid and cholesterol levels. These may be checked every 5 years starting at age 20.  Hepatitis C test.  Hepatitis B test. Screening  Diabetes screening. This is done by checking your blood sugar (glucose) after you have not eaten for a while (fasting).  Sexually transmitted disease (STD) testing.  BRCA-related cancer screening. This may be done if you have a family history of breast, ovarian, tubal, or peritoneal cancers.  Pelvic exam and Pap test. This may be done every 3 years starting at age 21. Starting at age 30, this may be done every 5 years if you have a Pap test in combination with an HPV test. Talk with your health care provider about your test results, treatment options, and if necessary, the need for more tests.   Follow these instructions at home: Eating and drinking   Eat a diet that includes fresh fruits and vegetables, whole grains, lean protein, and low-fat dairy.  Take vitamin and mineral supplements as recommended by your health care provider.  Do not drink alcohol if: ? Your health care provider tells you not to drink. ? You are  pregnant, may be pregnant, or are planning to become pregnant.  If you drink alcohol: ? Limit how much you have to 0-1 drink a day. ? Be aware of how much alcohol is in your drink. In the [place] one drink equals one 12 oz bottle of beer (355 mL), one 5 oz glass of wine (148 mL), or one 1 oz glass of hard liquor (44 mL). Lifestyle  Take daily care of your teeth and gums.  Stay active. Exercise for at least 30 minutes on 5 or more days each week.  Do not use any products that contain nicotine or tobacco, such as cigarettes, e-cigarettes, and chewing tobacco. If you need help quitting, ask your health care provider.  If you are sexually active, practice safe sex. Use a condom or other form of birth control (contraception) in order to prevent pregnancy and STIs (sexually transmitted infections). If you plan to become pregnant, see your health care provider for a preconception visit. What's next?  Visit your health care provider once a year for a well check visit.  Ask your health care provider how often you should have your eyes and teeth checked.  Stay up to date on all vaccines. This information is not intended to replace advice given to you by your health care provider. Make sure you discuss any questions you have with your health care provider. Document Revised: 01/03/2018 Document Reviewed: 01/03/2018 Elsevier Patient Education  2020 [employer]  Fatigue If you have fatigue, you feel tired all the time and have a lack of energy or a lack of motivation. Fatigue may make it difficult to start or complete tasks because of exhaustion. In general, occasional or mild fatigue is often a normal response to activity or life. However, long-lasting (chronic) or extreme fatigue may be a symptom of a medical condition. Follow these instructions at home: General instructions  Watch your fatigue for any changes.  Go to bed and get up at the same time every day.  Avoid fatigue by pacing  yourself during the day and getting enough sleep at night.  Maintain a healthy weight. Medicines  Take over-the-counter and prescription medicines only as told by your health care provider.  Take a multivitamin, if told by your health care provider.  Do not use herbal or dietary supplements unless they are approved by your health care provider. Activity   Exercise regularly, as told by your health care provider.  Use or practice techniques to help you relax, such as yoga, tai chi, meditation, or massage therapy. Eating and drinking   Avoid heavy meals in the evening.  Eat a well-balanced diet, which includes lean proteins, whole grains, plenty of fruits and vegetables, and low-fat dairy products.  Avoid consuming too much caffeine.  Avoid the use of alcohol.  Drink enough fluid to keep your urine pale yellow. Lifestyle  Change situations that cause you stress. Try to keep your work and personal schedule in balance.  Do not use any products that contain nicotine or tobacco, such as cigarettes and e-cigarettes. If you need help quitting, ask your health care provider.  Do not use drugs. Contact a health care   provider if:  Your fatigue does not get better.  You have a fever.  You suddenly lose or gain weight.  You have headaches.  You have trouble falling asleep or sleeping through the night.  You feel angry, guilty, anxious, or sad.  You are unable to have a bowel movement (constipation).  Your skin is dry.  You have swelling in your legs or another part of your body. Get help right away if:  You feel confused.  Your vision is blurry.  You feel faint or you pass out.  You have a severe headache.  You have severe pain in your abdomen, your back, or the area between your waist and hips (pelvis).  You have chest pain, shortness of breath, or an irregular or fast heartbeat.  You are unable to urinate, or you urinate less than normal.  You have abnormal  bleeding, such as bleeding from the rectum, vagina, nose, lungs, or nipples.  You vomit blood.  You have thoughts about hurting yourself or others. If you ever feel like you may hurt yourself or others, or have thoughts about taking your own life, get help right away. You can go to your nearest emergency department or call:  Your local emergency services (911 in the [place]  A suicide crisis helpline, such as the National Suicide Prevention Lifeline at 1-800-273-8255. This is open 24 hours a day. Summary  If you have fatigue, you feel tired all the time and have a lack of energy or a lack of motivation.  Fatigue may make it difficult to start or complete tasks because of exhaustion.  Long-lasting (chronic) or extreme fatigue may be a symptom of a medical condition.  Exercise regularly, as told by your health care provider.  Change situations that cause you stress. Try to keep your work and personal schedule in balance. This information is not intended to replace advice given to you by your health care provider. Make sure you discuss any questions you have with your health care provider. Document Revised: 11/13/2018 Document Reviewed: 01/17/2017 Elsevier Patient Education  2020 Elsevier Inc.  

## 2019-09-04 NOTE — Progress Notes (Signed)
Subjective:     Caitlyn Roy is a 34 y.o. female and is here for a comprehensive physical exam. The patient reports problems - Fatigue, emotional lability, muscle twitching, insomnia.  Patient also notes decreased energy and focus.  Endorses dealing with these issues since a child.  Pt had negative autoimmune work-up in the past.  Was previously followed by Dr. Dossie Der, rheumatology.  Patient notes normal amount of stress.  Currently in graduate school.  Pt has 2 children, her son is out of town with relatives this wk.  LMP 08/22/2019.  Pt s/p lap chole 08/14/19.    Social History   Socioeconomic History  . Marital status: Single    Spouse name: Not on file  . Number of children: 2  . Years of education: college   . Highest education level: Not on file  Occupational History  . Not on file  Tobacco Use  . Smoking status: Never Smoker  . Smokeless tobacco: Never Used  Substance and Sexual Activity  . Alcohol use: Yes    Comment: 2 times per week   . Drug use: No  . Sexual activity: Yes    Birth control/protection: None  Other Topics Concern  . Not on file  Social History Narrative   Lives at home with son   Drinks no caffeine    Social Determinants of Health   Financial Resource Strain:   . Difficulty of Paying Living Expenses:   Food Insecurity:   . Worried About Charity fundraiser in the Last Year:   . Arboriculturist in the Last Year:   Transportation Needs:   . Film/video editor (Medical):   Marland Kitchen Lack of Transportation (Non-Medical):   Physical Activity:   . Days of Exercise per Week:   . Minutes of Exercise per Session:   Stress:   . Feeling of Stress :   Social Connections:   . Frequency of Communication with Friends and Family:   . Frequency of Social Gatherings with Friends and Family:   . Attends Religious Services:   . Active Member of Clubs or Organizations:   . Attends Archivist Meetings:   Marland Kitchen Marital Status:   Intimate Partner Violence:    . Fear of Current or Ex-Partner:   . Emotionally Abused:   Marland Kitchen Physically Abused:   . Sexually Abused:    Health Maintenance  Topic Date Due  . COVID-19 Vaccine (1) Never done  . TETANUS/TDAP  Never done  . PAP SMEAR-Modifier  08/24/2019  . INFLUENZA VACCINE  12/07/2019  . HIV Screening  Completed    The following portions of the patient's history were reviewed and updated as appropriate: allergies, current medications, past family history, past medical history, past social history, past surgical history and problem list.  Review of Systems Pertinent items noted in HPI and remainder of comprehensive ROS otherwise negative.   Objective:    BP 110/78 (BP Location: Left Arm, Patient Position: Sitting, Cuff Size: Normal)   Pulse 92   Temp 97.9 F (36.6 C) (Temporal)   Wt 165 lb (74.8 kg)   LMP 08/22/2019 (Exact Date)   SpO2 98%   BMI 30.18 kg/m  General appearance: alert, cooperative and no distress Head: Normocephalic, without obvious abnormality, atraumatic Eyes: conjunctivae/corneas clear. PERRL, EOM's intact. Fundi benign. Ears: normal TM's and external ear canals both ears Nose: Nares normal. Septum midline. Mucosa normal. No drainage or sinus tenderness. Throat: lips, mucosa, and tongue normal; teeth and gums  normal Neck: no adenopathy, no carotid bruit, no JVD, supple, symmetrical, trachea midline and goiter-smooth without nodules Lungs: clear to auscultation bilaterally Heart: regular rate and rhythm, S1, S2 normal, no murmur, click, rub or gallop Abdomen: soft, non-tender; bowel sounds normal; no masses,  no organomegaly Extremities: extremities normal, atraumatic, no cyanosis or edema Pulses: 2+ and symmetric Skin: Skin color, texture, turgor normal. No rashes or lesions healing surgical lesions on abdomen s/p lap chole. Lymph nodes: Cervical, supraclavicular, and axillary nodes normal. Neurologic: Alert and oriented X 3, normal strength and tone. Normal symmetric  reflexes. Normal coordination and gait    Assessment:    Healthy female exam 2 wks s/p lap chole with ongoing chronic issues including fatigue, goiter, muscle twitching, and emotional lability.       Plan:     Anticipatory guidance given including wearing seatbelts, smoke detectors in the home, increasing physical activity, increasing p.o. intake of water and vegetables. -We will obtain labs -Pap done by OB/GYN -Given handout -Next CPE in 1 year See After Visit Summary for Counseling Recommendations    S/p lap chole -Encouraged to keep follow-up with [facility].  Fatigue, unspecified type  -Given continued chronic symptoms would advise repeat autoimmune testing. -Consider second opinion with rheumatology - Plan: Vitamin D, 25-hydroxy, Vitamin B12, Folate, Iron and TIBC, Ferritin  Emotional lability  -PHQ-9 score 16 -GAD-7 score 11 -Pt adamant she does not want medicine -Discussed counseling.  Pt given information about area providers.  Encouraged to schedule an appointment - Plan: TSH, T4, free  Muscle twitching  - Plan: CBC with Differential/Platelet, TSH, T4, free, Magnesium, Vitamin B12, Folate  Vitamin D def -Vitamin D low this visit -Rx for ergocalciferol sent to pharmacy.  Follow-up in 2-4 weeks for chronic issues.  Grier Mitts, MD

## 2019-09-05 LAB — MAGNESIUM: Magnesium: 2 mg/dL (ref 1.5–2.5)

## 2019-09-05 LAB — FOLATE: Folate: 8.9 ng/mL (ref >5.9)

## 2019-09-05 LAB — CBC WITH DIFFERENTIAL/PLATELET
Basophils Absolute: 0.1 10*3/uL (ref 0.0–0.1)
Basophils Relative: 1.2 % (ref 0.0–3.0)
Eosinophils Absolute: 0.2 10*3/uL (ref 0.0–0.7)
Eosinophils Relative: 2.7 % (ref 0.0–5.0)
HCT: 35.5 % — ABNORMAL LOW (ref 36.0–46.0)
Hemoglobin: 11.3 g/dL — ABNORMAL LOW (ref 12.0–15.0)
Lymphocytes Relative: 38.7 % (ref 12.0–46.0)
Lymphs Abs: 2.9 10*3/uL (ref 0.7–4.0)
MCHC: 32 g/dL (ref 30.0–36.0)
MCV: 84 fl (ref 78.0–100.0)
Monocytes Absolute: 0.4 10*3/uL (ref 0.1–1.0)
Monocytes Relative: 5.9 % (ref 3.0–12.0)
Neutro Abs: 3.9 10*3/uL (ref 1.4–7.7)
Neutrophils Relative %: 51.5 % (ref 43.0–77.0)
Platelets: 386 10*3/uL (ref 150.0–400.0)
RBC: 4.22 Mil/uL (ref 3.87–5.11)
RDW: 14.2 % (ref 11.5–15.5)
WBC: 7.5 10*3/uL (ref 4.0–10.5)

## 2019-09-05 LAB — T4, FREE: Free T4: 0.78 ng/dL (ref 0.60–1.60)

## 2019-09-05 LAB — TSH: TSH: 1.56 u[IU]/mL (ref 0.35–4.50)

## 2019-09-05 LAB — VITAMIN B12: Vitamin B-12: 261 pg/mL (ref 211–911)

## 2019-09-05 LAB — FERRITIN: Ferritin: 63.7 ng/mL (ref 10.0–291.0)

## 2019-09-05 LAB — VITAMIN D 25 HYDROXY (VIT D DEFICIENCY, FRACTURES): VITD: 25.38 ng/mL — ABNORMAL LOW (ref 30.00–100.00)

## 2019-09-05 MED ORDER — FERROUS SULFATE 325 (65 FE) MG PO TBEC: 325.0000 mg | DELAYED_RELEASE_TABLET | Freq: Every day | ORAL | 3 refills | Status: DC

## 2019-09-05 MED ORDER — VITAMIN D (ERGOCALCIFEROL) 1.25 MG (50000 UNIT) PO CAPS: 50000.0000 [IU] | ORAL_CAPSULE | ORAL | 0 refills | Status: DC

## 2019-10-07 ENCOUNTER — Other Ambulatory Visit: Payer: Self-pay

## 2019-10-08 ENCOUNTER — Encounter: Payer: Self-pay | Admitting: Family Medicine

## 2019-10-08 ENCOUNTER — Ambulatory Visit (INDEPENDENT_AMBULATORY_CARE_PROVIDER_SITE_OTHER): Payer: 59 | Admitting: Family Medicine

## 2019-10-08 VITALS — BP 98/78 | HR 88 | Temp 97.8°F | Wt 163.0 lb

## 2019-10-08 DIAGNOSIS — H6980 Other specified disorders of Eustachian tube, unspecified ear: Secondary | ICD-10-CM | POA: Diagnosis not present

## 2019-10-08 DIAGNOSIS — R5383 Other fatigue: Secondary | ICD-10-CM

## 2019-10-08 DIAGNOSIS — H6122 Impacted cerumen, left ear: Secondary | ICD-10-CM | POA: Diagnosis not present

## 2019-10-08 DIAGNOSIS — E559 Vitamin D deficiency, unspecified: Secondary | ICD-10-CM

## 2019-10-08 DIAGNOSIS — J302 Other seasonal allergic rhinitis: Secondary | ICD-10-CM

## 2019-10-08 DIAGNOSIS — H6990 Unspecified Eustachian tube disorder, unspecified ear: Secondary | ICD-10-CM

## 2019-10-08 DIAGNOSIS — D509 Iron deficiency anemia, unspecified: Secondary | ICD-10-CM | POA: Diagnosis not present

## 2019-10-08 LAB — CBC WITH DIFFERENTIAL/PLATELET
Basophils Absolute: 0 10*3/uL (ref 0.0–0.1)
Basophils Relative: 0.6 % (ref 0.0–3.0)
Eosinophils Absolute: 0.2 10*3/uL (ref 0.0–0.7)
Eosinophils Relative: 3.3 % (ref 0.0–5.0)
HCT: 34.2 % — ABNORMAL LOW (ref 36.0–46.0)
Hemoglobin: 11.2 g/dL — ABNORMAL LOW (ref 12.0–15.0)
Lymphocytes Relative: 34.1 % (ref 12.0–46.0)
Lymphs Abs: 2.5 10*3/uL (ref 0.7–4.0)
MCHC: 32.8 g/dL (ref 30.0–36.0)
MCV: 83 fl (ref 78.0–100.0)
Monocytes Absolute: 0.5 10*3/uL (ref 0.1–1.0)
Monocytes Relative: 6.4 % (ref 3.0–12.0)
Neutro Abs: 4.1 10*3/uL (ref 1.4–7.7)
Neutrophils Relative %: 55.6 % (ref 43.0–77.0)
Platelets: 306 10*3/uL (ref 150.0–400.0)
RBC: 4.12 Mil/uL (ref 3.87–5.11)
RDW: 14.7 % (ref 11.5–15.5)
WBC: 7.3 10*3/uL (ref 4.0–10.5)

## 2019-10-08 LAB — BASIC METABOLIC PANEL
BUN: 8 mg/dL (ref 6–23)
CO2: 29 mEq/L (ref 19–32)
Calcium: 9.5 mg/dL (ref 8.4–10.5)
Chloride: 103 mEq/L (ref 96–112)
Creatinine, Ser: 0.86 mg/dL (ref 0.40–1.20)
GFR: 91.33 mL/min (ref >60.00)
Glucose, Bld: 88 mg/dL (ref 70–99)
Potassium: 4 mEq/L (ref 3.5–5.1)
Sodium: 137 mEq/L (ref 135–145)

## 2019-10-08 LAB — SEDIMENTATION RATE: Sed Rate: 33 mm/hr — ABNORMAL HIGH (ref 0–20)

## 2019-10-08 LAB — VITAMIN D 25 HYDROXY (VIT D DEFICIENCY, FRACTURES): VITD: 30.41 ng/mL (ref 30.00–100.00)

## 2019-10-08 LAB — C-REACTIVE PROTEIN: CRP: <1 mg/dL (ref 0.5–20.0)

## 2019-10-08 MED ORDER — CETIRIZINE HCL 10 MG PO TABS: 10.0000 mg | ORAL_TABLET | Freq: Every day | ORAL | 3 refills | Status: DC

## 2019-10-08 NOTE — Patient Instructions (Addendum)
Eustachian Tube Dysfunction  Eustachian tube dysfunction refers to a condition in which a blockage develops in the narrow passage that connects the middle ear to the back of the nose (eustachian tube). The eustachian tube regulates air pressure in the middle ear by letting air move between the ear and nose. It also helps to drain fluid from the middle ear space. Eustachian tube dysfunction can affect one or both ears. When the eustachian tube does not function properly, air pressure, fluid, or both can build up in the middle ear. What are the causes? This condition occurs when the eustachian tube becomes blocked or cannot open normally. Common causes of this condition include:  Ear infections.  Colds and other infections that affect the nose, mouth, and throat (upper respiratory tract).  Allergies.  Irritation from cigarette smoke.  Irritation from stomach acid coming up into the esophagus (gastroesophageal reflux). The esophagus is the tube that carries food from the mouth to the stomach.  Sudden changes in air pressure, such as from descending in an airplane or scuba diving.  Abnormal growths in the nose or throat, such as: ? Growths that line the nose (nasal polyps). ? Abnormal growth of cells (tumors). ? Enlarged tissue at the back of the throat (adenoids). What increases the risk? You are more likely to develop this condition if:  You smoke.  You are overweight.  You are a child who has: ? Certain birth defects of the mouth, such as cleft palate. ? Large tonsils or adenoids. What are the signs or symptoms? Common symptoms of this condition include:  A feeling of fullness in the ear.  Ear pain.  Clicking or popping noises in the ear.  Ringing in the ear.  Hearing loss.  Loss of balance.  Dizziness. Symptoms may get worse when the air pressure around you changes, such as when you travel to an area of high elevation, fly on an airplane, or go scuba diving. How is  this diagnosed? This condition may be diagnosed based on:  Your symptoms.  A physical exam of your ears, nose, and throat.  Tests, such as those that measure: ? The movement of your eardrum (tympanogram). ? Your hearing (audiometry). How is this treated? Treatment depends on the cause and severity of your condition.  In mild cases, you may relieve your symptoms by moving air into your ears. This is called "popping the ears."  In more severe cases, or if you have symptoms of fluid in your ears, treatment may include: ? Medicines to relieve congestion (decongestants). ? Medicines that treat allergies (antihistamines). ? Nasal sprays or ear drops that contain medicines that reduce swelling (steroids). ? A procedure to drain the fluid in your eardrum (myringotomy). In this procedure, a small tube is placed in the eardrum to:  Drain the fluid.  Restore the air in the middle ear space. ? A procedure to insert a balloon device through the nose to inflate the opening of the eustachian tube (balloon dilation). Follow these instructions at home: Lifestyle  Do not do any of the following until your health care provider approves: ? Travel to high altitudes. ? Fly in airplanes. ? Work in a Pension scheme manager or room. ? Scuba dive.  Do not use any products that contain nicotine or tobacco, such as cigarettes and e-cigarettes. If you need help quitting, ask your health care provider.  Keep your ears dry. Wear fitted earplugs during showering and bathing. Dry your ears completely after. General instructions  Take over-the-counter  and prescription medicines only as told by your health care provider.  Use techniques to help pop your ears as recommended by your health care provider. These may include: ? Chewing gum. ? Yawning. ? Frequent, forceful swallowing. ? Closing your mouth, holding your nose closed, and gently blowing as if you are trying to blow air out of your nose.  Keep all  follow-up visits as told by your health care provider. This is important. Contact a health care provider if:  Your symptoms do not go away after treatment.  Your symptoms come back after treatment.  You are unable to pop your ears.  You have: ? A fever. ? Pain in your ear. ? Pain in your head or neck. ? Fluid draining from your ear.  Your hearing suddenly changes.  You become very dizzy.  You lose your balance. Summary  Eustachian tube dysfunction refers to a condition in which a blockage develops in the eustachian tube.  It can be caused by ear infections, allergies, inhaled irritants, or abnormal growths in the nose or throat.  Symptoms include ear pain, hearing loss, or ringing in the ears.  Mild cases are treated with maneuvers to unblock the ears, such as yawning or ear popping.  Severe cases are treated with medicines. Surgery may also be done (rare). This information is not intended to replace advice given to you by your health care provider. Make sure you discuss any questions you have with your health care provider. Document Revised: 08/14/2017 Document Reviewed: 08/14/2017 Elsevier Patient Education  [place].   Allergic Rhinitis, Adult Allergic rhinitis is a reaction to allergens in the air. Allergens are tiny specks (particles) in the air that cause your body to have an allergic reaction. This condition cannot be passed from person to person (is not contagious). Allergic rhinitis cannot be cured, but it can be controlled. There are two types of allergic rhinitis:  Seasonal. This type is also called hay fever. It happens only during certain times of the year.  Perennial. This type can happen at any time of the year. What are the causes? This condition may be caused by:  Pollen from grasses, trees, and weeds.  House dust mites.  Pet dander.  Mold. What are the signs or symptoms? Symptoms of this condition include:  Sneezing.  Runny or  stuffy nose (nasal congestion).  A lot of mucus in the back of the throat (postnasal drip).  Itchy nose.  Tearing of the eyes.  Trouble sleeping.  Being sleepy during day. How is this treated? There is no cure for this condition. You should avoid things that trigger your symptoms (allergens). Treatment can help to relieve symptoms. This may include:  Medicines that block allergy symptoms, such as antihistamines. These may be given as a shot, nasal spray, or pill.  Shots that are given until your body becomes less sensitive to the allergen (desensitization).  Stronger medicines, if all other treatments have not worked. Follow these instructions at home: Avoiding allergens   Find out what you are allergic to. Common allergens include smoke, dust, and pollen.  Avoid them if you can. These are some of the things that you can do to avoid allergens: ? Replace carpet with wood, tile, or vinyl flooring. Carpet can trap dander and dust. ? Clean any mold found in the home. ? Do not smoke. Do not allow smoking in your home. ? Change your heating and air conditioning filter at least once a month. ? During allergy  season:  Keep windows closed as much as you can. If possible, use air conditioning when there is a lot of pollen in the air.  Use a special filter for allergies with your furnace and air conditioner.  Plan outdoor activities when pollen counts are lowest. This is usually during the early morning or evening hours.  If you do go outdoors when pollen count is high, wear a special mask for people with allergies.  When you come indoors, take a shower and change your clothes before sitting on furniture or bedding. General instructions  Do not use fans in your home.  Do not hang clothes outside to dry.  Wear sunglasses to keep pollen out of your eyes.  Wash your hands right away after you touch household pets.  Take over-the-counter and prescription medicines only as told by  your doctor.  Keep all follow-up visits as told by your doctor. This is important. Contact a doctor if:  You have a fever.  You have a cough that does not go away (is persistent).  You start to make whistling sounds when you breathe (wheeze).  Your symptoms do not get better with treatment.  You have thick fluid coming from your nose.  You start to have nosebleeds. Get help right away if:  Your tongue or your lips are swollen.  You have trouble breathing.  You feel dizzy or you feel like you are going to pass out (faint).  You have cold sweats. Summary  Allergic rhinitis is a reaction to allergens in the air.  This condition may be caused by allergens. These include pollen, dust mites, pet dander, and mold.  Symptoms include a runny, itchy nose, sneezing, or tearing eyes. You may also have trouble sleeping or feel sleepy during the day.  Treatment includes taking medicines and avoiding allergens. You may also get shots or take stronger medicines.  Get help if you have a fever or a cough that does not stop. Get help right away if you are short of breath. This information is not intended to replace advice given to you by your health care provider. Make sure you discuss any questions you have with your health care provider. Document Revised: 08/13/2018 Document Reviewed: 11/13/2017 Elsevier Patient Education  [place].  Anemia  Anemia is a condition in which you do not have enough red blood cells or hemoglobin. Hemoglobin is a substance in red blood cells that carries oxygen. When you do not have enough red blood cells or hemoglobin (are anemic), your body cannot get enough oxygen and your organs may not work properly. As a result, you may feel very tired or have other problems. What are the causes? Common causes of anemia include:  Excessive bleeding. Anemia can be caused by excessive bleeding inside or outside the body, including bleeding from the intestine or  from periods in women.  Poor nutrition.  Long-lasting (chronic) kidney, thyroid, and liver disease.  Bone marrow disorders.  Cancer and treatments for cancer.  HIV (human immunodeficiency virus) and AIDS (acquired immunodeficiency syndrome).  Treatments for HIV and AIDS.  Spleen problems.  Blood disorders.  Infections, medicines, and autoimmune disorders that destroy red blood cells. What are the signs or symptoms? Symptoms of this condition include:  Minor weakness.  Dizziness.  Headache.  Feeling heartbeats that are irregular or faster than normal (palpitations).  Shortness of breath, especially with exercise.  Paleness.  Cold sensitivity.  Indigestion.  Nausea.  Difficulty sleeping.  Difficulty concentrating. Symptoms may occur  suddenly or develop slowly. If your anemia is mild, you may not have symptoms. How is this diagnosed? This condition is diagnosed based on:  Blood tests.  Your medical history.  A physical exam.  Bone marrow biopsy. Your health care provider may also check your stool (feces) for blood and may do additional testing to look for the cause of your bleeding. You may also have other tests, including:  Imaging tests, such as a CT scan or MRI.  Endoscopy.  Colonoscopy. How is this treated? Treatment for this condition depends on the cause. If you continue to lose a lot of blood, you may need to be treated at a hospital. Treatment may include:  Taking supplements of iron, vitamin X45, or folic acid.  Taking a hormone medicine (erythropoietin) that can help to stimulate red blood cell growth.  Having a blood transfusion. This may be needed if you lose a lot of blood.  Making changes to your diet.  Having surgery to remove your spleen. Follow these instructions at home:  Take over-the-counter and prescription medicines only as told by your health care provider.  Take supplements only as told by your health care  provider.  Follow any diet instructions that you were given.  Keep all follow-up visits as told by your health care provider. This is important. Contact a health care provider if:  You develop new bleeding anywhere in the body. Get help right away if:  You are very weak.  You are short of breath.  You have pain in your abdomen or chest.  You are dizzy or feel faint.  You have trouble concentrating.  You have bloody or black, tarry stools.  You vomit repeatedly or you vomit up blood. Summary  Anemia is a condition in which you do not have enough red blood cells or enough of a substance in your red blood cells that carries oxygen (hemoglobin).  Symptoms may occur suddenly or develop slowly.  If your anemia is mild, you may not have symptoms.  This condition is diagnosed with blood tests as well as a medical history and physical exam. Other tests may be needed.  Treatment for this condition depends on the cause of the anemia. This information is not intended to replace advice given to you by your health care provider. Make sure you discuss any questions you have with your health care provider. Document Revised: 04/06/2017 Document Reviewed: 05/26/2016 Elsevier Patient Education  2020 [place].  Vitamin D Deficiency Vitamin D deficiency is when your body does not have enough vitamin D. Vitamin D is important to your body for many reasons:  It helps the body absorb two important minerals--calcium and phosphorus.  It plays a role in bone health.  It may help to prevent some diseases, such as diabetes and multiple sclerosis.  It plays a role in muscle function, including heart function. If vitamin D deficiency is severe, it can cause a condition in which your bones become soft. In adults, this condition is called osteomalacia. In children, this condition is called rickets. What are the causes? This condition may be caused by:  Not eating enough foods that contain  vitamin D.  Not getting enough natural sun exposure.  Having certain digestive system diseases that make it difficult for your body to absorb vitamin D. These diseases include Crohn's disease, chronic pancreatitis, and cystic fibrosis.  Having a surgery in which a part of the stomach or a part of the small intestine is removed.  Having chronic  kidney disease or liver disease. What increases the risk? You are more likely to develop this condition if you:  Are older.  Do not spend much time outdoors.  Live in a long-term care facility.  Have had broken bones.  Have weak or thin bones (osteoporosis).  Have a disease or condition that changes how the body absorbs vitamin D.  Have dark skin.  Take certain medicines, such as steroid medicines or certain seizure medicines.  Are overweight or obese. What are the signs or symptoms? In mild cases of vitamin D deficiency, there may not be any symptoms. If the condition is severe, symptoms may include:  Bone pain.  Muscle pain.  Falling often.  Broken bones caused by a minor injury. How is this diagnosed? This condition may be diagnosed with blood tests. Imaging tests such as X-rays may also be done to look for changes in the bone. How is this treated? Treatment for this condition may depend on what caused the condition. Treatment options include:  Taking vitamin D supplements. Your health care provider will suggest what dose is best for you.  Taking a calcium supplement. Your health care provider will suggest what dose is best for you. Follow these instructions at home: Eating and drinking   Eat foods that contain vitamin D. Choices include: ? Fortified dairy products, cereals, or juices. Fortified means that vitamin D has been added to the food. Check the label on the package to see if the food is fortified. ? Fatty fish, such as salmon or trout. ? Eggs. ? Oysters. ? Mushrooms. The items listed above may not be a  complete list of recommended foods and beverages. Contact a dietitian for more information. General instructions  Take medicines and supplements only as told by your health care provider.  Get regular, safe exposure to natural sunlight.  Do not use a tanning bed.  Maintain a healthy weight. Lose weight if needed.  Keep all follow-up visits as told by your health care provider. This is important. How is this prevented? You can get vitamin D by:  Eating foods that naturally contain vitamin D.  Eating or drinking products that have been fortified with vitamin D, such as cereals, juices, and dairy products (including milk).  Taking a vitamin D supplement or a multivitamin supplement that contains vitamin D.  Being in the sun. Your body naturally makes vitamin D when your skin is exposed to sunlight. Your body changes the sunlight into a form of the vitamin that it can use. Contact a health care provider if:  Your symptoms do not go away.  You feel nauseous or you vomit.  You have fewer bowel movements than usual or are constipated. Summary  Vitamin D deficiency is when your body does not have enough vitamin D.  Vitamin D is important to your body for good bone health and muscle function, and it may help prevent some diseases.  Vitamin D deficiency is primarily treated through supplementation. Your health care provider will suggest what dose is best for you.  You can get vitamin D by eating foods that contain vitamin D, by being in the sun, and by taking a vitamin D supplement or a multivitamin supplement that contains vitamin D. This information is not intended to replace advice given to you by your health care provider. Make sure you discuss any questions you have with your health care provider. Document Revised: 12/31/2017 Document Reviewed: 12/31/2017 Elsevier Patient Education  [place].

## 2019-10-08 NOTE — Progress Notes (Signed)
Subjective:    Patient ID: Caitlyn Roy, female    DOB: [birth year], 34 y.o.   MRN: [id number]  No chief complaint on file.   HPI Patient was seen today for follow-up.  Pt states she did not start taking iron supplement of vitamin D that was 7.  Pt notes taking [place] and a multivitamin as well as vitamin D 1000 IU daily.  Pt endorses continued fatigue and at times palpitations.  Seen in the past by Cardiology.   Pt notes increased allergy symptoms.  Endorses rhinorrhea, headaches, itchy ears, throat irritation, blurred vision.  Taking Benadryl at night.  Notices the blurred vision in the morning.  Pt mentions per her OB/Gyn she needs a "diagnosis" for her ongoing symptoms of fatigue.  In the past seen by rheumatology for question of autoimmune d/o.  Past Medical History:  Diagnosis Date  . Anemia   . 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 Anxiety   . Depression   . Dysrhythmia    palpitation   . Fibroid   . H/O cesarean section 2010  . History of blood transfusion   . Nausea    d/t to "issues with gallballader"  . 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 Tubal pregnancy   . Vaginal odor   . Vaginitis     Allergies  Allergen Reactions  . Penicillins Hives and Shortness Of Breath    Did it involve swelling of the face/tongue/throat, SOB, or low BP? Yes Did it involve sudden or severe rash/hives, skin peeling, or any reaction on the inside of your mouth or nose? No Did you need to seek medical attention at a hospital or doctor's office? No When did it last happen?2020 If all above answers are "NO", may proceed with cephalosporin use.     ROS General: Denies fever, chills, night sweats, changes in weight, changes in appetite  +fatigue HEENT: Denies ear pain, sore throat  + scratchy throat, rhinorrhea, blurry vision, itchy ears, headache CV: Denies CP, SOB, orthopnea  +  palpitations Pulm: Denies SOB, cough, wheezing GI: Denies abdominal pain, nausea, vomiting, diarrhea, constipation GU: Denies dysuria, hematuria, frequency, vaginal discharge Msk: Denies muscle cramps, joint pains Neuro: Denies weakness, numbness, tingling Skin: Denies rashes, bruising Psych: Denies depression, anxiety, hallucinations     Objective:    Blood pressure 98/78, pulse 88, temperature 97.8 F (36.6 C), temperature source Temporal, weight 163 lb (73.9 kg), SpO2 98 %, not currently breastfeeding.  Gen. Pleasant, well-nourished, in no distress, normal affect  HEENT: [place]/AT, face symmetric, conjunctiva clear, no scleral icterus, PERRLA, EOMI, nares patent without drainage, pharynx without erythema or exudate.  Right TM full.  Left canal occluded with cerumen.  Left ear irrigated.  Left TM normal after irrigation.  No TTP of maxillary, frontal, or ethmoid sinuses.  No cervical lymphadenopathy. Lungs: no accessory muscle use, CTAB, no wheezes or rales Cardiovascular: RRR, no m/r/g, no peripheral edema Musculoskeletal: No deformities, no cyanosis or clubbing, normal tone Neuro:  A&Ox3, CN II-XII intact, normal gait Skin:  Warm, no lesions/ rash   Wt Readings from Last 3 Encounters:  10/08/19 163 lb (73.9 kg)  09/04/19 165 lb (74.8 kg)  08/14/19 168 lb 6 oz (76.4 kg)    Lab Results  Component Value Date   WBC 7.5 09/04/2019   HGB 11.3 (L) 09/04/2019   HCT 35.5 (L) 09/04/2019   PLT 386.0 09/04/2019  GLUCOSE 96 08/12/2019   ALT 15 08/12/2019   AST 17 08/12/2019   NA 140 08/12/2019   K 4.3 08/12/2019   CL 104 08/12/2019   CREATININE 0.85 08/12/2019   BUN 10 08/12/2019   CO2 27 08/12/2019   TSH 1.56 09/04/2019    Assessment/Plan:  Impacted cerumen of left ear -Consent obtained.  L ear irrigated.  Pt tolerated procedure well. -Consider Debrox eardrops -given handout  Dysfunction of Eustachian tube, unspecified laterality -Flonase and Zyrtec. -Given  handout  Seasonal allergies -Flonase as needed -Given handout - Plan: cetirizine (ZYRTEC) 10 MG tablet  Iron deficiency anemia, unspecified iron deficiency anemia type -Discussed starting iron supplement -We will recheck hemoglobin this visit - Plan: CBC with Differential/Platelet  Vitamin D deficiency -Continue daily multivitamin -We will provide additional supplement if needed based on labs - Plan: Vitamin D, 25-hydroxy  Fatigue, unspecified type - Plan: Sedimentation Rate, C-reactive Protein, Rheumatoid Factor, ANA, Lupus (SLE) Analysis, Basic metabolic panel  F/u prn  Grier Mitts, MD

## 2019-10-09 ENCOUNTER — Ambulatory Visit: Payer: 59 | Admitting: Family Medicine

## 2019-10-10 ENCOUNTER — Other Ambulatory Visit: Payer: Self-pay | Admitting: Family Medicine

## 2019-10-10 DIAGNOSIS — R768 Other specified abnormal immunological findings in serum: Secondary | ICD-10-CM

## 2019-10-11 LAB — LUPUS (SLE) ANALYSIS
Anti Nuclear Antibody (ANA): POSITIVE — AB
Anti-striation Abs: NEGATIVE
Complement C4, Serum: 42 mg/dL — ABNORMAL HIGH (ref 12–38)
ENA RNP Ab: 0.2 AI (ref 0.0–0.9)
ENA SM Ab Ser-aCnc: <0.2 AI (ref 0.0–0.9)
ENA SSA (RO) Ab: <0.2 AI (ref 0.0–0.9)
ENA SSB (LA) Ab: <0.2 AI (ref 0.0–0.9)
Mitochondrial Ab: <20 Units (ref 0.0–20.0)
Parietal Cell Ab: 17.5 Units (ref 0.0–20.0)
Scleroderma (Scl-70) (ENA) Antibody, IgG: <0.2 AI (ref 0.0–0.9)
Smooth Muscle Ab: 11 Units (ref 0–19)
Thyroperoxidase Ab SerPl-aCnc: 12 IU/mL (ref 0–34)
dsDNA Ab: 11 IU/mL — ABNORMAL HIGH (ref 0–9)

## 2019-10-13 ENCOUNTER — Ambulatory Visit: Payer: 59 | Attending: Obstetrics and Gynecology | Admitting: Physical Therapy

## 2019-10-13 DIAGNOSIS — R252 Cramp and spasm: Secondary | ICD-10-CM | POA: Insufficient documentation

## 2019-10-13 DIAGNOSIS — M6281 Muscle weakness (generalized): Secondary | ICD-10-CM | POA: Insufficient documentation

## 2019-10-13 DIAGNOSIS — R1030 Lower abdominal pain, unspecified: Secondary | ICD-10-CM | POA: Insufficient documentation

## 2019-10-13 LAB — RHEUMATOID FACTOR: Rheumatoid fact SerPl-aCnc: <14 IU/mL (ref <14)

## 2019-10-13 LAB — ANTI-NUCLEAR AB-TITER (ANA TITER): ANA Titer 1: 1:320 {titer} — ABNORMAL HIGH

## 2019-10-13 LAB — ANA: Anti Nuclear Antibody (ANA): POSITIVE — AB

## 2019-10-15 ENCOUNTER — Ambulatory Visit: Payer: 59 | Admitting: Physical Therapy

## 2019-10-15 ENCOUNTER — Encounter: Payer: Self-pay | Admitting: Physical Therapy

## 2019-10-15 ENCOUNTER — Other Ambulatory Visit: Payer: Self-pay

## 2019-10-15 DIAGNOSIS — R1030 Lower abdominal pain, unspecified: Secondary | ICD-10-CM

## 2019-10-15 DIAGNOSIS — R252 Cramp and spasm: Secondary | ICD-10-CM

## 2019-10-15 DIAGNOSIS — M6281 Muscle weakness (generalized): Secondary | ICD-10-CM

## 2019-10-15 NOTE — Therapy (Signed)
[facility] [address] [place], [place], [address] Phone: [phone]   Fax:  [phone]  Physical Therapy Evaluation  Patient Details  Name: Caitlyn Roy MRN: [id number] Date of Birth: [birth year] Referring Provider (PT): Dr. [name]   Encounter Date: 10/15/2019  PT End of Session - 10/15/19 1133    Visit Number  1    Date for PT Re-Evaluation  01/07/20    Authorization Type  Aetna    Authorization - Visit Number  1    Authorization - Number of Visits  60    PT Start Time  1100    PT Stop Time  6387    PT Time Calculation (min)  34 min    Activity Tolerance  Patient tolerated treatment well;No increased pain    Behavior During Therapy  WFL for tasks assessed/performed       Past Medical History:  Diagnosis Date  . Anemia   . Anginal pain ([place])    being followed by cardiologists  . Anxiety   . Depression   . Dysrhythmia    palpitation   . Fibroid   . H/O cesarean section 2010  . History of blood transfusion   . Nausea    d/t to "issues with gallballader"  . 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 Tubal pregnancy   . Vaginal odor   . Vaginitis     Past Surgical History:  Procedure Laterality Date  . BIOPSY EYE MUSCLE    . CESAREAN SECTION  2010  . CESAREAN SECTION N/A 04/06/2018   Procedure: CESAREAN SECTION bladder irrigation;  Surgeon: [name], MD;  Location: [place];  Service: Obstetrics;  Laterality: N/A;  needs RNFA  . CHOLECYSTECTOMY N/A 08/14/2019   Procedure: LAPAROSCOPIC CHOLECYSTECTOMY;  Surgeon: Greer Pickerel, MD;  [facility];  Service: General;  Laterality: N/A;  . DIAGNOSTIC LAPAROSCOPY WITH REMOVAL OF ECTOPIC PREGNANCY N/A 08/07/2018   Procedure: DIAGNOSTIC LAPAROSCOPY WITH REMOVAL OF ECTOPIC PREGNANCY;  Surgeon: Janyth Pupa, DO;  Location: [place];  Service: Gynecology;  Laterality: N/A;   . LAPAROSCOPIC APPENDECTOMY N/A 10/20/2016   Procedure: APPENDECTOMY LAPAROSCOPIC;  Surgeon: Judeth Horn, MD;  Location: [place];  Service: General;  Laterality: N/A;  . LAPAROSCOPIC SALPINGO OOPHERECTOMY Left 08/07/2018   Procedure: LAPAROSCOPIC SALPINGECTOMY LEFT ;  Surgeon: Janyth Pupa, DO;  Location: [place];  Service: Gynecology;  Laterality: Left;    There were no vitals filed for this visit.   Subjective Assessment - 10/15/19 1103    Subjective  Pain in lower abdomen that started 1 year ago.    Patient Stated Goals  reduce pain    Currently in Pain?  Yes    Pain Score  5     Pain Location  Abdomen    Pain Orientation  Lower    Pain Descriptors / Indicators  Aching    Pain Type  Chronic pain    Pain Onset  More than a month ago    Pain Frequency  Intermittent    Aggravating Factors   varies    Pain Relieving Factors  does not know    Multiple Pain Sites  No         [facility] PT Assessment - 10/15/19 0001      Assessment   Medical Diagnosis  R10.2 pelvic pain    Referring Provider (PT)  Dr. [name]    Onset Date/Surgical  Date  10/15/18    Prior Therapy  none      Precautions   Precautions  None      Restrictions   Weight Bearing Restrictions  No      Balance Screen   Has the patient fallen in the past 6 months  No    Has the patient had a decrease in activity level because of a fear of falling?   No    Is the patient reluctant to leave their home because of a fear of falling?   No      Home Film/video editor residence      Prior Function   Level of Independence  Independent    Vocation Requirements  sitting    Leisure  nothing      Cognition   Overall Cognitive Status  Within Functional Limits for tasks assessed      Posture/Postural Control   Posture/Postural Control  No significant limitations      ROM / Strength   AROM / PROM / Strength  AROM;PROM;Strength      AROM   Overall AROM Comments  lumbar ROM is full       Palpation   Spinal mobility  L1-L5    Palpation comment  tender along the c-section scar and right lower abdomen, right psoas                  Objective measurements completed on examination: See above findings.    Pelvic Floor Special Questions - 10/15/19 0001    Prior Pregnancies  Yes    Number of C-Sections  2    Currently Sexually Active  No    Urinary Leakage  No    Fecal incontinence  No    Falling out feeling (prolapse)  No       [facility] Adult PT Treatment/Exercise - 10/15/19 0001      Manual Therapy   Manual Therapy  Soft tissue mobilization    Manual therapy comments  educated patient on scar massage that she can do daily while starting on the sides of the scar    Soft tissue mobilization  soft tissue mobilization to the c-section scar to release the tissue while monitoring for pain             PT Education - 10/15/19 1133    Education Details  education on scar massage    Person(s) Educated  Patient    Methods  Explanation;Demonstration;Handout    Comprehension  Returned demonstration;Verbalized understanding       PT Short Term Goals - 10/15/19 1140      PT SHORT TERM GOAL #1   Title  independent with initial HEP    Time  4    Period  Weeks    Status  New    Target Date  11/12/19        PT Long Term Goals - 10/15/19 1141      PT LONG TERM GOAL #1   Title  independent with advanced HEP    Time  12    Period  Weeks    Status  New    Target Date  01/07/20      PT LONG TERM GOAL #2   Title  able to take care of her kids with pain decreased >/=75% due to improved mobility of scar tissue    Time  12    Period  Weeks    Status  New  Target Date  01/07/20      PT LONG TERM GOAL #3   Title  able to lift items and children with pain level decreased >= 75% and due to increased abdominal strength    Time  12    Period  Weeks    Status  New    Target Date  01/07/20      PT LONG TERM GOAL #4   Title  improved quality of life due to  pain decreased >/= 75%    Time  12    Period  Weeks    Status  New    Target Date  01/07/20             Plan - 10/15/19 1134    Clinical Impression Statement  Patient is a 34 year old female with pelvic pain after having 2 c-sections and laparpcopic surgery for ectopic pregnancy. Patient reports her intermittent pain at level 5/10 in the lower abdominal area that happens suddenly with no cause. Patient has restrictions along the c-section sca and right lower abdomen. The scar is senstive with pressure and numbness below the umbilicus to the scar. Abdominal strength is 1/5.  Tightness in the lumbar area. Decreased lower rib cage expansion. Patient reports no urinary leakage or constipation. Patient will benefit from skilled therapy to reduce her pain and improve quality of life.    Personal Factors and Comorbidities  Comorbidity 3+;Fitness    Comorbidities  C-section x2; Laparoscopic salpingo oopherectomy and for ectopic pregnancy    Examination-Activity Limitations  Locomotion Level;Caring for Others;Lift    Examination-Participation Restrictions  Community Activity;Cleaning;Meal Prep    Stability/Clinical Decision Making  Evolving/Moderate complexity    Clinical Decision Making  Low    Rehab Potential  Excellent    PT Frequency  1x / week    PT Duration  12 weeks    PT Treatment/Interventions  Cryotherapy;Electrical Stimulation;Moist Heat;Ultrasound;Neuromuscular re-education;Therapeutic exercise;Therapeutic activities;Patient/family education;Manual techniques;Dry needling;Scar mobilization;Taping    PT Next Visit Plan  work on c-section scar; lower rib cage expansion; trunk and hip stretches; using a suction cup on the abdomen    Consulted and Agree with Plan of Care  Patient       Patient will benefit from skilled therapeutic intervention in order to improve the following deficits and impairments:  Increased fascial restricitons, Pain, Decreased strength, Decreased activity  tolerance  Visit Diagnosis: Muscle weakness (generalized) - Plan: PT plan of care cert/re-cert  Lower abdominal pain - Plan: PT plan of care cert/re-cert  Cramp and spasm - Plan: PT plan of care cert/re-cert     Problem List Patient Active Problem List   Diagnosis Date Noted  . Ectopic pregnancy 08/07/2018  . S/P repeat low transverse C-section 04/06/2018  . H/O: cesarean section 04/06/2018  . Goiter 08/16/2017  . Acute appendicitis 10/20/2016  . Iron deficiency anemia 03/03/2016  . Palpitations 03/03/2016  . BV (bacterial vaginosis) 10/15/2014    Earlie Counts, PT 10/15/19 11:45 AM   [facility] [address], [place], [place], [address] Phone: [phone]   Fax:  [phone]  Name: Caitlyn Roy MRN: [id number] Date of Birth: 1985/10/17

## 2019-10-22 ENCOUNTER — Other Ambulatory Visit: Payer: Self-pay

## 2019-10-22 ENCOUNTER — Encounter: Payer: Self-pay | Admitting: Physical Therapy

## 2019-10-22 ENCOUNTER — Ambulatory Visit: Payer: 59 | Admitting: Physical Therapy

## 2019-10-22 DIAGNOSIS — R1030 Lower abdominal pain, unspecified: Secondary | ICD-10-CM

## 2019-10-22 DIAGNOSIS — M6281 Muscle weakness (generalized): Secondary | ICD-10-CM | POA: Diagnosis not present

## 2019-10-22 DIAGNOSIS — R252 Cramp and spasm: Secondary | ICD-10-CM

## 2019-10-22 NOTE — Therapy (Addendum)
[facility] [address] [place], [place], [address] Phone: [phone]   Fax:  [phone]  Physical Therapy Treatment  Patient Details  Name: LYCIA SACHDEVA MRN: [id number] Date of Birth: Dec 01, 1985 Referring Provider (PT): Dr. [name]   Encounter Date: 10/22/2019   PT End of Session - 10/22/19 1645    Visit Number 2    Date for PT Re-Evaluation 01/07/20    Authorization Type Aetna    PT Start Time 1615    PT Stop Time 6948   left due to pain still there   PT Time Calculation (min) 30 min    Activity Tolerance Patient tolerated treatment well;No increased pain    Behavior During Therapy WFL for tasks assessed/performed           Past Medical History:  Diagnosis Date  . Anemia   . Anginal pain ([place])    being followed by cardiologists  . Anxiety   . Depression   . Dysrhythmia    palpitation   . Fibroid   . H/O cesarean section 2010  . History of blood transfusion   . Nausea    d/t to "issues with gallballader"  . 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 Tubal pregnancy   . Vaginal odor   . Vaginitis     Past Surgical History:  Procedure Laterality Date  . BIOPSY EYE MUSCLE    . CESAREAN SECTION  2010  . CESAREAN SECTION N/A 04/06/2018   Procedure: CESAREAN SECTION bladder irrigation;  Surgeon: [name], MD;  Location: [place];  Service: Obstetrics;  Laterality: N/A;  needs RNFA  . CHOLECYSTECTOMY N/A 08/14/2019   Procedure: LAPAROSCOPIC CHOLECYSTECTOMY;  Surgeon: Greer Pickerel, MD;  [facility];  Service: General;  Laterality: N/A;  . DIAGNOSTIC LAPAROSCOPY WITH REMOVAL OF ECTOPIC PREGNANCY N/A 08/07/2018   Procedure: DIAGNOSTIC LAPAROSCOPY WITH REMOVAL OF ECTOPIC PREGNANCY;  Surgeon: Janyth Pupa, DO;  Location: [place];  Service: Gynecology;  Laterality: N/A;  . LAPAROSCOPIC APPENDECTOMY N/A 10/20/2016    Procedure: APPENDECTOMY LAPAROSCOPIC;  Surgeon: Judeth Horn, MD;  Location: [place];  Service: General;  Laterality: N/A;  . LAPAROSCOPIC SALPINGO OOPHERECTOMY Left 08/07/2018   Procedure: LAPAROSCOPIC SALPINGECTOMY LEFT ;  Surgeon: Janyth Pupa, DO;  Location: [place];  Service: Gynecology;  Laterality: Left;    There were no vitals filed for this visit.   Subjective Assessment - 10/22/19 1616    Subjective The pain got worse. I am really sore in the middle. The day I saw you I was fine. Two days later the abdomen was sore.    Patient Stated Goals reduce pain    Currently in Pain? Yes    Pain Score 7     Pain Location Abdomen    Pain Orientation Lower    Pain Descriptors / Indicators Aching    Pain Type Chronic pain    Pain Onset More than a month ago    Pain Frequency Intermittent    Aggravating Factors  varies    Pain Relieving Factors does not know                             [facility] Adult PT Treatment/Exercise - 10/22/19 0001      Manual Therapy   Manual Therapy Myofascial release;Soft tissue mobilization    Soft tissue mobilization soft tissue mobilization to the  c-section scar to release the tissue while monitoring for pain    Myofascial Release urogenital diaphragm release , release of the umbilicus, release of the bladder and sac of douglas                    PT Short Term Goals - 10/15/19 1140      PT SHORT TERM GOAL #1   Title independent with initial HEP    Time 4    Period Weeks    Status New    Target Date 11/12/19             PT Long Term Goals - 10/15/19 1141      PT LONG TERM GOAL #1   Title independent with advanced HEP    Time 12    Period Weeks    Status New    Target Date 01/07/20      PT LONG TERM GOAL #2   Title able to take care of her kids with pain decreased >/=75% due to improved mobility of scar tissue    Time 12    Period Weeks    Status New    Target Date 01/07/20      PT LONG TERM GOAL #3   Title able  to lift items and children with pain level decreased >= 75% and due to increased abdominal strength    Time 12    Period Weeks    Status New    Target Date 01/07/20      PT LONG TERM GOAL #4   Title improved quality of life due to pain decreased >/= 75%    Time 12    Period Weeks    Status New    Target Date 01/07/20                 Plan - 10/22/19 1646    Clinical Impression Statement Patient has been in pain and is not sure therapy is going to work. Patient had pain during treatent during manaual work. Patient had no changes since the evaluation. Patient continues to have pain and will see a GI. Patient will benefit from skilled therapy to reduce her pain and improve quality of life.    Personal Factors and Comorbidities Comorbidity 3+;Fitness    Comorbidities C-section x2; Laparoscopic salpingo oopherectomy and for ectopic pregnancy    Examination-Activity Limitations Locomotion Level;Caring for Others;Lift    Examination-Participation Restrictions Community Activity;Cleaning;Meal Prep    Stability/Clinical Decision Making Evolving/Moderate complexity    Rehab Potential Excellent    PT Frequency 1x / week    PT Duration 12 weeks    PT Treatment/Interventions Cryotherapy;Electrical Stimulation;Moist Heat;Ultrasound;Neuromuscular re-education;Therapeutic exercise;Therapeutic activities;Patient/family education;Manual techniques;Dry needling;Scar mobilization;Taping    PT Next Visit Plan work on c-section scar; lower rib cage expansion; trunk and hip stretches; using a suction cup on the abdomen if patient returns    Recommended Other Services MD signed initial eval    Consulted and Agree with Plan of Care Patient           Patient will benefit from skilled therapeutic intervention in order to improve the following deficits and impairments:  Increased fascial restricitons, Pain, Decreased strength, Decreased activity tolerance  Visit Diagnosis: Muscle weakness  (generalized)  Lower abdominal pain  Cramp and spasm     Problem List Patient Active Problem List   Diagnosis Date Noted  . Ectopic pregnancy 08/07/2018  . S/P repeat low transverse C-section 04/06/2018  . H/O: cesarean section 04/06/2018  .  Goiter 08/16/2017  . Acute appendicitis 10/20/2016  . Iron deficiency anemia 03/03/2016  . Palpitations 03/03/2016  . BV (bacterial vaginosis) 10/15/2014    Earlie Counts, PT 10/22/19 5:01 PM   [facility] [address], [place], [place], [address] Phone: [phone]   Fax:  [phone]  Name: VILLA BURGIN MRN: [id number] Date of Birth: Feb 02, 1986  PHYSICAL THERAPY DISCHARGE SUMMARY  Visits from Start of Care: 2  Current functional level related to goals / functional outcomes: See above.    Remaining deficits: See above.    Education / Equipment: HEP Plan:                                                    Patient goals were not met. Patient is being discharged due to not returning since the last visit.  Thank you for the referral. Earlie Counts, PT 12/25/19 12:15 PM  ?????

## 2019-10-27 ENCOUNTER — Encounter: Payer: 59 | Admitting: Physical Therapy

## 2019-11-06 ENCOUNTER — Encounter: Payer: 59 | Admitting: Physical Therapy

## 2019-11-17 ENCOUNTER — Encounter: Payer: 59 | Admitting: Physical Therapy

## 2019-11-24 ENCOUNTER — Encounter: Payer: 59 | Admitting: Physical Therapy

## 2019-11-27 ENCOUNTER — Ambulatory Visit: Payer: 59 | Admitting: Family Medicine

## 2020-01-22 ENCOUNTER — Ambulatory Visit: Payer: 59 | Admitting: Family Medicine

## 2020-02-05 NOTE — Progress Notes (Deleted)
Office Visit Note  Patient: Caitlyn Roy             Date of Birth: [birth year]           MRN: [id number]             PCP: Billie Ruddy, MD Referring: Billie Ruddy, MD Visit Date: 02/17/2020 Occupation: @GUAROCC @  Subjective:  No chief complaint on file.   History of Present Illness: Caitlyn Roy is a 34 y.o. female ***   Activities of Daily Living:  Patient reports morning stiffness for *** {minute/hour:19697}.   Patient {ACTIONS;DENIES/REPORTS:21021675::"Denies"} nocturnal pain.  Difficulty dressing/grooming: {ACTIONS;DENIES/REPORTS:21021675::"Denies"} Difficulty climbing stairs: {ACTIONS;DENIES/REPORTS:21021675::"Denies"} Difficulty getting out of chair: {ACTIONS;DENIES/REPORTS:21021675::"Denies"} Difficulty using hands for taps, buttons, cutlery, and/or writing: {ACTIONS;DENIES/REPORTS:21021675::"Denies"}  No Rheumatology ROS completed.   PMFS History:  Patient Active Problem List   Diagnosis Date Noted  . Ectopic pregnancy 08/07/2018  . S/P repeat low transverse C-section 04/06/2018  . H/O: cesarean section 04/06/2018  . Goiter 08/16/2017  . Acute appendicitis 10/20/2016  . Iron deficiency anemia 03/03/2016  . Palpitations 03/03/2016  . BV (bacterial vaginosis) 10/15/2014    Past Medical History:  Diagnosis Date  . Anemia   . Anginal pain ([place])    being followed by cardiologists  . Anxiety   . Depression   . Dysrhythmia    palpitation   . Fibroid   . H/O cesarean section 2010  . History of blood transfusion   . Nausea    d/t to "issues with gallballader"  . 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 Tubal pregnancy   . Vaginal odor   . Vaginitis     Family History  Problem Relation Age of Onset  . Hypertension Mother   . Breast cancer Mother   . Heart attack Mother   . Seizures Mother   . Alcohol abuse Father   . Kidney disease Maternal Grandmother   . Diabetes  Paternal Grandmother    Past Surgical History:  Procedure Laterality Date  . BIOPSY EYE MUSCLE    . CESAREAN SECTION  2010  . CESAREAN SECTION N/A 04/06/2018   Procedure: CESAREAN SECTION bladder irrigation;  Surgeon: [name], MD;  Location: [place];  Service: Obstetrics;  Laterality: N/A;  needs RNFA  . CHOLECYSTECTOMY N/A 08/14/2019   Procedure: LAPAROSCOPIC CHOLECYSTECTOMY;  Surgeon: Greer Pickerel, MD;  [facility];  Service: General;  Laterality: N/A;  . DIAGNOSTIC LAPAROSCOPY WITH REMOVAL OF ECTOPIC PREGNANCY N/A 08/07/2018   Procedure: DIAGNOSTIC LAPAROSCOPY WITH REMOVAL OF ECTOPIC PREGNANCY;  Surgeon: Janyth Pupa, DO;  Location: [place];  Service: Gynecology;  Laterality: N/A;  . LAPAROSCOPIC APPENDECTOMY N/A 10/20/2016   Procedure: APPENDECTOMY LAPAROSCOPIC;  Surgeon: Judeth Horn, MD;  Location: [place];  Service: General;  Laterality: N/A;  . LAPAROSCOPIC SALPINGO OOPHERECTOMY Left 08/07/2018   Procedure: LAPAROSCOPIC SALPINGECTOMY LEFT ;  Surgeon: Janyth Pupa, DO;  Location: [place];  Service: Gynecology;  Laterality: Left;   Social History   Social History Narrative   Lives at home with son   Drinks no caffeine    There is no immunization history for the selected administration types on file for this patient.   Objective: Vital Signs: There were no vitals taken for this visit.   Physical Exam   Musculoskeletal Exam: ***  CDAI Exam: CDAI Score: -- Patient Global: --; Provider Global: -- Swollen: --; Tender: -- Joint Exam 02/17/2020   No  joint exam has been documented for this visit   There is currently no information documented on the homunculus. Go to the Rheumatology activity and complete the homunculus joint exam.  Investigation: No additional findings.  Imaging: No results found.  Recent Labs: Lab Results  Component Value Date   WBC 7.3 10/08/2019   HGB 11.2 (L) 10/08/2019   PLT 306.0 10/08/2019   NA 137 10/08/2019   K 4.0  10/08/2019   CL 103 10/08/2019   CO2 29 10/08/2019   GLUCOSE 88 10/08/2019   BUN 8 10/08/2019   CREATININE 0.86 10/08/2019   BILITOT 0.6 08/12/2019   ALKPHOS 92 08/12/2019   AST 17 08/12/2019   ALT 15 08/12/2019   PROT 7.9 08/12/2019   ALBUMIN 3.9 08/12/2019   CALCIUM 9.5 10/08/2019   GFRAA >60 08/12/2019    Speciality Comments: No specialty comments available.  Procedures:  No procedures performed Allergies: Penicillins   Assessment / Plan:     Visit Diagnoses: No diagnosis found.  Orders: No orders of the defined types were placed in this encounter.  No orders of the defined types were placed in this encounter.   Face-to-face time spent with patient was *** minutes. Greater than 50% of time was spent in counseling and coordination of care.  Follow-Up Instructions: No follow-ups on file.   Ofilia Neas, PA-C  Note - This record has been created using Dragon software.  Chart creation errors have been sought, but may not always  have been located. Such creation errors do not reflect on  the standard of medical care.

## 2020-02-17 ENCOUNTER — Ambulatory Visit: Payer: 59 | Admitting: Rheumatology

## 2020-03-06 ENCOUNTER — Emergency Department (HOSPITAL_COMMUNITY): Admission: EM | Admit: 2020-03-06 | Discharge: 2020-03-07 | Discharge status: Against Medical Advice | Payer: 59

## 2020-03-06 ENCOUNTER — Other Ambulatory Visit: Payer: Self-pay

## 2020-03-14 ENCOUNTER — Inpatient Hospital Stay (HOSPITAL_COMMUNITY)
Admission: AD | Admit: 2020-03-14 | Discharge: 2020-03-14 | Disposition: A | Discharge status: Home / Self Care | Payer: 59 | Attending: Obstetrics & Gynecology | Admitting: Obstetrics & Gynecology

## 2020-03-14 ENCOUNTER — Other Ambulatory Visit: Payer: Self-pay

## 2020-03-14 DIAGNOSIS — R1031 Right lower quadrant pain: Secondary | ICD-10-CM | POA: Diagnosis present

## 2020-03-14 DIAGNOSIS — Z8759 Personal history of other complications of pregnancy, childbirth and the puerperium: Secondary | ICD-10-CM | POA: Insufficient documentation

## 2020-03-14 DIAGNOSIS — R0789 Other chest pain: Secondary | ICD-10-CM | POA: Insufficient documentation

## 2020-03-14 DIAGNOSIS — Z3202 Encounter for pregnancy test, result negative: Secondary | ICD-10-CM | POA: Diagnosis not present

## 2020-03-14 LAB — HCG, QUANTITATIVE, PREGNANCY: hCG, Beta Chain, Quant, S: <1 m[IU]/mL (ref <5)

## 2020-03-14 NOTE — MAU Note (Signed)
Pt reports she thinks she is having an ectopic pregnancy.  C/O pain in her RLQ and chest tightness and left arm pain. Has these symptoms last year and it was ectopic pregnancy. Reports she had a period that ended on 10/31 and then she started bleeding again 2 days later and stopped yesterday. Pain started around 10pm this evening.8/10.  V/SS.  EKG ordered per protocol and UPT. UPT negative. Notifed pt of negative test and she stated " "It's going to be negative it was negative when I had my ectopic last year." Informed her we still needed to get blood work and an EKG. Notified Provider and will see her in Triage.

## 2020-03-14 NOTE — MAU Provider Note (Addendum)
None    S Ms. Caitlyn Roy is a 34 y.o. [phone] patient who presents to MAU today with complaint of RLQ abdominal pain and chest "pressure" in setting of history of ectopic pregnancies in 2014 and 2020. Her abdominal pain is new onset this evening at 10pm. She believes that she may have another ectopic pregnancy.   Patient endorses LMP of 03/08/2020 on initial CNM evaluation. She has not taken a home pregnancy test. She states she "never had a positive test" while being evaluated for her ectopic pregnancies and that they are always discovered via TVUS. She is requesting a TVUS immediately following negative urine pregnancy test. Patient verbalizes to CNM "if I have private insurance I should be able to request an ultrasound".  Patient also c/o chest "pressure" which she states to CNM she thinks it is related to anxiety. She denies chest pain, palpitations, weakness or syncope.  O BP (!) 129/94   Pulse 79   Temp 98.5 F (36.9 C)   Resp 18   SpO2 100%    Physical Exam Vitals and nursing note reviewed. Exam conducted with a chaperone present.  Cardiovascular:     Rate and Rhythm: Normal rate.  Pulmonary:     Effort: Pulmonary effort is normal.  Skin:    General: Skin is warm and dry.     Capillary Refill: Capillary refill takes less than 2 seconds.  Neurological:     General: No focal deficit present.     Mental Status: She is alert.  Psychiatric:        Mood and Affect: Mood normal.    A --Medical screening exam complete --Negative urine pregnancy test in MAU --Discussed with patient that TVUS is not used for pregnancy confirmation and that serum Quant hCG is indicated as next step following negative urine PT per MAU protocol. TVUS would be part of additional workup once pregnancy is confirmed via serum Quant hCG. CNM and RN reviewed that confirmed pregnancy is mandatory indication for emergent TVUS in MAU --Patient is requesting to speak with a supervisor and asks that I  document my "refusal" to perform TVUS immediately following negative urine pregnancy test. Golda Acre, [facility] requested at bedside --Golda Acre at bedside with Heywood Bene, RN to restate policy regarding positive serum Quant hCG prior to further evaluation with TVUS. Patient again requests that "refusal to provide TVUS" be documented. --Serum Quant hCG <1. CNM at bedside to discuss this is considered negative result. Pt verbalizes that "its just too soon". Patient again requests that all parties refusal to perform TVUS be documented. --Normal EKG in MAU  P Discharge from MAU in stable condition Patient given the option of transfer to [facility] for further evaluation or seek care in outpatient facility of choice. Pt declines transfer List of options for follow-up given Warning signs for worsening condition that would warrant emergency follow-up discussed Patient may return to MAU as needed for acute pregnancy or gyn emergencies  Mallie Snooks, CNM 03/14/2020 5:02 AM

## 2020-03-14 NOTE — Discharge Instructions (Signed)
Human Chorionic Gonadotropin Test Why am I having this test? A human chorionic gonadotropin (hCG) test is done to determine whether you are pregnant. It can also be used:  To diagnose an abnormal pregnancy.  To determine whether you have had a failed pregnancy (miscarriage) or are at risk of one. What is being tested? This test checks the level of the human chorionic gonadotropin (hCG) hormone in the blood. This hormone is produced during pregnancy by the cells that form the placenta. The placenta is the organ that grows inside your womb (uterus) to nourish a developing baby. When you are pregnant, hCG can be detected in your blood or urine 7 to 8 days before your missed period. It continues to go up for the first 8-10 weeks of pregnancy. The presence of hCG in your blood can be measured with several different types of tests. You may have:  A urine test. ? Because this hormone is eliminated from your body by your kidneys, you may have a urine test to find out whether you are pregnant. A home pregnancy test detects whether there is hCG in your urine. ? A urine test only shows whether there is hCG in your urine. It does not measure how much.  A qualitative blood test. ? You may have this type of blood test to find out if you are pregnant. ? This blood test only shows whether there is hCG in your blood. It does not measure how much.  A quantitative blood test. ? This type of blood test measures the amount of hCG in your blood. ? You may have this test to:  Diagnose an abnormal pregnancy.  Check whether you have had a miscarriage.  Determine whether you are at risk of a miscarriage. What kind of sample is taken?     Two kinds of samples may be collected to test for the hCG hormone.  Blood. It is usually collected by inserting a needle into a blood vessel.  Urine. It is usually collected by urinating into a germ-free (sterile) specimen cup. It is best to collect the sample the first  time you urinate in the morning. How do I prepare for this test? No preparation is needed for a blood test.  For the urine test:  Let your health care provider know about: ? All medicines you are taking, including vitamins, herbs, creams, and over-the-counter medicines. ? Any blood in your urine. This may interfere with the result.  Do not drink too much fluid. Drink as you normally would, or as directed by your health care provider. How are the results reported? Depending on the type of test that you have, your test results may be reported as values. Your health care provider will compare your results to normal ranges that were established after testing a large group of people (reference ranges). Reference ranges may vary among labs and hospitals. For this test, common reference ranges that show absence of pregnancy are:  Quantitative hCG blood levels: less than 5 IU/L. Other results will be reported as either positive or negative. For this test, normal results (meaning the absence of pregnancy) are:  Negative for hCG in the urine test.  Negative for hCG in the qualitative blood test. What do the results mean? Urine and qualitative blood test  A negative result could mean: ? That you are not pregnant. ? That the test was done too early in your pregnancy to detect hCG in your blood or urine. If you still have other signs  of pregnancy, the test will be repeated.  A positive result means: ? That you are most likely pregnant. Your health care provider may confirm your pregnancy with an imaging study (ultrasound) of your uterus, if needed. Quantitative blood test Results of the quantitative hCG blood test will be interpreted as follows:  Less than 5 IU/L: You are most likely not pregnant.  Greater than 25 IU/L: You are most likely pregnant.  hCG levels that are higher than expected: ? You are pregnant with twins. ? You have abnormal growths in the uterus.  hCG levels that are  rising more slowly than expected: ? You have an ectopic pregnancy (also called a tubal pregnancy).  hCG levels that are falling: ? You may be having a miscarriage. Talk with your health care provider about what your results mean. Questions to ask your health care provider Ask your health care provider, or the department that is doing the test:  When will my results be ready?  How will I get my results?  What are my treatment options?  What other tests do I need?  What are my next steps? Summary  A human chorionic gonadotropin test is done to determine whether you are pregnant.  When you are pregnant, hCG can be detected in your blood or urine 7 to 8 days before your missed period. It continues to go up for the first 8-10 weeks of pregnancy.  Your hCG level can be measured with different types of tests. You may have a urine test, a qualitative blood test, or a quantitative blood test.  Talk with your health care provider about what your results mean. This information is not intended to replace advice given to you by your health care provider. Make sure you discuss any questions you have with your health care provider. Document Revised: 03/26/2017 Document Reviewed: 03/26/2017 Elsevier Patient Education  2020 [employer].

## 2020-03-14 NOTE — MAU Note (Signed)
BHCG >1. Provider informed pt  And she was discharged.

## 2020-03-17 ENCOUNTER — Other Ambulatory Visit: Payer: Self-pay

## 2020-03-18 ENCOUNTER — Encounter: Payer: Self-pay | Admitting: Family Medicine

## 2020-03-18 ENCOUNTER — Ambulatory Visit (INDEPENDENT_AMBULATORY_CARE_PROVIDER_SITE_OTHER): Payer: 59 | Admitting: Family Medicine

## 2020-03-18 VITALS — BP 102/80 | HR 104 | Temp 99.0°F | Wt 162.0 lb

## 2020-03-18 DIAGNOSIS — R41840 Attention and concentration deficit: Secondary | ICD-10-CM | POA: Diagnosis not present

## 2020-03-18 DIAGNOSIS — M329 Systemic lupus erythematosus, unspecified: Secondary | ICD-10-CM | POA: Insufficient documentation

## 2020-03-18 MED ORDER — AMPHETAMINE-DEXTROAMPHETAMINE 5 MG PO TABS: 2.5000 mg | ORAL_TABLET | Freq: Every day | ORAL | 0 refills | Status: DC

## 2020-03-18 NOTE — Patient Instructions (Signed)
Systemic Lupus Erythematosus, Adult  Systemic lupus erythematosus (SLE) is a long-term (chronic) disease that can affect many parts of the body. SLE is an autoimmune disease. With this type of disease, the body's defense system (immune system) mistakenly attacks healthy tissues. This can cause damage to the skin, joints, blood vessels, brain, kidneys, lungs, heart, and other internal organs. It causes pain, irritation, and inflammation.  What are the causes?  The cause of this condition is not known.  What increases the risk?  The following factors may make you more likely to develop this condition:  · Being female.  · Being of Asian, Hispanic, or African-American descent.  · Having a family history of the condition.  · Being exposed to tobacco smoke or smoking cigarettes.  · Having an infection with a virus, such as Epstein-Barr virus.  · Having a history of exposure to silica dust, metals, chemicals, mold or mildew, or insecticides.  · Using oral contraceptives or hormone replacement therapy.  What are the signs or symptoms?  This condition can affect almost any organ or system in the body. Symptoms of the condition depend on which organ or system is affected.  The most common symptoms include:  · Fever.  · Fatigue.  · Weight loss.  · Muscle aches.  · Joint pain.  · Skin rashes, especially over the nose and cheeks (butterfly rash) and after sun exposure.  Symptoms can come and go. A period of time when symptoms get worse or come back is called a flare. A period of time with no symptoms is called a remission.  How is this diagnosed?  This condition is diagnosed based on:  · Your symptoms.  · Your medical history.  · A physical exam.  You may also have tests, including:  · Blood tests.  · Urine tests.  · A chest X-ray.  You may be referred to an autoimmune disease specialist (rheumatologist).  How is this treated?  There is no cure for this condition, but treatment can help to control symptoms, prevent flares (keep  symptoms in remission), and prevent damage to the heart, lungs, kidneys, and other organs. Treatment will depend on what symptoms you are having and what organs or systems are affected. Treatment may involve taking a combination of medicines over time.  Common medicines used to treat this condition include:  · Antimalarial medicines to control symptoms, prevent flares, and protect against organ damage.  · Corticosteroids and NSAIDs to reduce inflammation.  · Medicines to weaken your immune system (immunosuppressants).  · Biologic response modifiers to reduce inflammation and damage.  Follow these instructions at home:  Eating and drinking  · Eat a heart-healthy diet. This may include:  ? Eating high-fiber foods, such as fresh fruits and vegetables, whole grains, and beans.  ? Eating heart-healthy fats (omega-3 fats), such as fish, flaxseed, and flaxseed oil.  ? Limiting foods that are high in saturated fat and cholesterol, such as processed and fried foods, fatty meat, and full-fat dairy.  ? Limiting how much salt (sodium) you eat.  · Include calcium and vitamin D in your diet. Good sources of calcium and vitamin D include:  ? Low-fat dairy products such as milk, yogurt, and cheese.  ? Certain fish, such as fresh or canned salmon, tuna, and sardines.  ? Products that have calcium and vitamin D added to them (fortified products), such as fortified cereals or juice.  Medicines  · Take over-the-counter and prescription medicines only as told by your health   that contain nicotine or tobacco, such as cigarettes and e-cigarettes. If you need help quitting, ask your health care provider.  Protect your skin from the sun by applying  sunblock and wearing protective hats and clothing.  Learn as much as you can about your condition and have a good support system in place. Support may come from family, friends, or a lupus support group. General instructions  Work closely with all of your health care providers to manage your condition.  Stay up to date on all vaccines as directed by your health care provider.  Keep all follow-up visits as told by your health care provider. This is important. Contact a health care provider if:  You have a fever.  Your symptoms flare.  You develop new symptoms.  You have bloody, foamy, or coffee-colored urine.  There are changes in your urination. For example, you urinate more often at night.  You think that you may be depressed or have anxiety.  You become pregnant or plan to become pregnant. Pregnancy in women with this condition is considered high risk. Get help right away if:  You have chest pain.  You have trouble breathing.  You have a seizure.  You suddenly get a very bad headache.  You suddenly develop facial or body weakness.  You cannot speak.  You cannot understand speech. These symptoms may represent a serious problem that is an emergency. Do not wait to see if the symptoms will go away. Get medical help right away. Call your local emergency services (911 in the [place] Do not drive yourself to the hospital. Summary  Systemic lupus erythematosus (SLE) is a long-term disease that can affect many parts of the body.  SLE is an autoimmune disease. That means your body's defense system (immune system) mistakenly attacks healthy tissues.  There is no cure for this condition, but treatment can help to control symptoms, prevent flares, and prevent damage to your organs. Treatment may involve taking a combination of medicines over time. This information is not intended to replace advice given to you by your health care provider. Make sure you discuss any questions you  have with your health care provider. Document Revised: 06/07/2017 Document Reviewed: 06/01/2017 Elsevier Patient Education  2020 [employer].

## 2020-03-18 NOTE — Progress Notes (Signed)
Subjective:    Patient ID: Caitlyn Roy, female    DOB: 01/23/1986, 34 y.o.   MRN: [id number]  No chief complaint on file.   HPI Patient was seen today for follow-up.  Patient seen on 10/08/2019 for ongoing concerns including fatigue.  Autoimmune screening labs came back positive.  Patient is seen by rheumatology, Dr. Avon Gully.  Pt states additional labs were ordered and she has f/u in a few months.  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  Pt finds it difficult to concentrate on any task at home or at work.  Pt notes h/o ADHD as a child, but never wanted to start medicine.  Pt has FMLA paper work.  Past Medical History:  Diagnosis Date  . Anemia   . Anginal pain ([place])    being followed by cardiologists  . Anxiety   . Depression   . Dysrhythmia    palpitation   . Fibroid   . H/O cesarean section 2010  . History of blood transfusion   . Nausea    d/t to "issues with gallballader"  . 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 Tubal pregnancy   . Vaginal odor   . Vaginitis     Allergies  Allergen Reactions  . Penicillins Hives and Shortness Of Breath    Did it involve swelling of the face/tongue/throat, SOB, or low BP? Yes Did it involve sudden or severe rash/hives, skin peeling, or any reaction on the inside of your mouth or nose? No Did you need to seek medical attention at a hospital or doctor's office? No When did it last happen?2020 If all above answers are "NO", may proceed with cephalosporin use.     ROS General: Denies fever, chills, night sweats, changes in weight, changes in appetite  +decreased concentration, fatigue HEENT: Denies headaches, ear pain, changes in vision, rhinorrhea, sore throat CV: Denies CP, palpitations, SOB, orthopnea Pulm: Denies SOB, cough, wheezing GI: Denies abdominal pain, nausea, vomiting, diarrhea, constipation GU: Denies  dysuria, hematuria, frequency, vaginal discharge Msk: Denies muscle cramps, joint pains Neuro: Denies weakness, numbness, tingling Skin: Denies rashes, bruising Psych: Denies hallucinations  +anxiety and depression     Objective:    Blood pressure 102/80, pulse (!) 104, temperature 99 F (37.2 C), temperature source Oral, weight 162 lb (73.5 kg), SpO2 99 %, not currently breastfeeding.  Gen. Pleasant, well-nourished, in no distress, mildly depressed affect HEENT: [place]/AT, face symmetric, conjunctiva clear, no scleral icterus, PERRLA, EOMI, nares patent without drainage Lungs: no accessory muscle use Cardiovascular: RRR, no peripheral edema Musculoskeletal: No deformities, no cyanosis or clubbing, normal tone Neuro:  A&Ox3, CN II-XII intact, normal gait Skin:  Warm, no lesions/ rash   Wt Readings from Last 3 Encounters:  10/08/19 163 lb (73.9 kg)  09/04/19 165 lb (74.8 kg)  08/14/19 168 lb 6 oz (76.4 kg)    Lab Results  Component Value Date   WBC 7.3 10/08/2019   HGB 11.2 (L) 10/08/2019   HCT 34.2 (L) 10/08/2019   PLT 306.0 10/08/2019   GLUCOSE 88 10/08/2019   ALT 15 08/12/2019   AST 17 08/12/2019   NA 137 10/08/2019   K 4.0 10/08/2019   CL 103 10/08/2019   CREATININE 0.86 10/08/2019   BUN 8 10/08/2019   CO2 29 10/08/2019   TSH 1.56 09/04/2019    Assessment/Plan:  Systemic lupus erythematosus, unspecified SLE type, unspecified organ involvement  status ([place]) -Symptoms of fatigue, decreased concentration.  Though no skin or other organ manifestations noted. -positive ANA on 10/08/2019 with nuclear data pattern and titer 1: 320.  Complement C4 42, double-stranded DNA antibody 11 -Awaiting further testing done by rheumatology -Continue follow-up with rheumatology, Dr. Dossie Der  Concentration deficit  -Patient given ASR SV1.1 -Consider formal ADHD testing. -We will try to obtain information from ADHD concerns when patient was a child -Discussed starting low-dose Adderall  daily to see if patient notes improvement in symptoms -Patient strongly encouraged to reach out to her grad school advisors - Plan: amphetamine-dextroamphetamine (ADDERALL) 5 MG tablet  FMLA forms completed  F/u in 1-2 wks.  Grier Mitts, MD

## 2020-03-19 ENCOUNTER — Telehealth: Payer: Self-pay | Admitting: Family Medicine

## 2020-03-19 NOTE — Telephone Encounter (Signed)
Patient called in reference to her FMLA paperwork  She said that her employer hasn't received the paperwork and wants to make sure that her leave ID is on the paperwork.   The leave ID is [id number]  She needs that added to the form and re faxed.  She also needs a work note.  Please advise

## 2020-03-19 NOTE — Telephone Encounter (Signed)
Work excuse note was sent to pt through MyChart pt portal. Pt form is being completed by Dr Volanda Napoleon, pt will be notified when form has been faxed.

## 2020-03-22 NOTE — Telephone Encounter (Signed)
Pt state that she did not need a work note, state that Dr Volanda Napoleon is aware what note she is referring to.Please advise

## 2020-03-24 ENCOUNTER — Encounter: Payer: Self-pay | Admitting: Family Medicine

## 2020-03-24 ENCOUNTER — Telehealth: Payer: Self-pay

## 2020-03-24 ENCOUNTER — Ambulatory Visit (INDEPENDENT_AMBULATORY_CARE_PROVIDER_SITE_OTHER): Payer: 59 | Admitting: Family Medicine

## 2020-03-24 ENCOUNTER — Other Ambulatory Visit: Payer: Self-pay

## 2020-03-24 VITALS — BP 102/76 | HR 119 | Temp 98.5°F | Wt 158.6 lb

## 2020-03-24 DIAGNOSIS — R41840 Attention and concentration deficit: Secondary | ICD-10-CM | POA: Diagnosis not present

## 2020-03-24 DIAGNOSIS — R253 Fasciculation: Secondary | ICD-10-CM

## 2020-03-24 DIAGNOSIS — E538 Deficiency of other specified B group vitamins: Secondary | ICD-10-CM | POA: Diagnosis not present

## 2020-03-24 DIAGNOSIS — F32A Depression, unspecified: Secondary | ICD-10-CM

## 2020-03-24 DIAGNOSIS — F419 Anxiety disorder, unspecified: Secondary | ICD-10-CM | POA: Diagnosis not present

## 2020-03-24 DIAGNOSIS — M329 Systemic lupus erythematosus, unspecified: Secondary | ICD-10-CM

## 2020-03-24 MED ORDER — AMPHETAMINE-DEXTROAMPHETAMINE 15 MG PO TABS: 15.0000 mg | ORAL_TABLET | Freq: Every day | ORAL | 0 refills | Status: DC

## 2020-03-24 MED ORDER — CYANOCOBALAMIN 1000 MCG/ML IJ SOLN
1000.0000 ug | Freq: Once | INTRAMUSCULAR | Status: AC
  Administered 2020-03-24: 1000 ug via INTRAMUSCULAR

## 2020-03-24 NOTE — Telephone Encounter (Signed)
Pt dropped off her FMLA form at her last appointment. Pt form was completed and faxed to [facility] [employer]. Pt was given the confirmation of the fax and the fax copy

## 2020-03-24 NOTE — Telephone Encounter (Signed)
Pt given a copy of FMLA paperwork

## 2020-03-24 NOTE — Progress Notes (Signed)
Subjective:    Patient ID: Caitlyn Roy, female    DOB: 1985/11/17, 34 y.o.   MRN: [id number]  No chief complaint on file.   HPI Patient is a 34 yo female with pmh sig for h/o iron def anemia and SLE? who was seen today for follow-up.  Patient states she feels worse than she did last week.  Notes increased fatigue.  Was only able to work half a day last week then took off the rest of the week.  Patient tried taking Adderall 5 mg but did not notice a difference.  Pt increased dose to 10 mg daily, but still did not notice a difference.  Patient unable to sleep last night but attributes symptoms to taking Adderall late in the afternoon (3:30 PM).  Trying to eat something before taking the medication.  Pt mentions h/o head injury 2 yrs ago.  Endorses twitching all over ever since.  Past Medical History:  Diagnosis Date  . Anemia   . Anginal pain ([place])    being followed by cardiologists  . Anxiety   . Depression   . Dysrhythmia    palpitation   . Fibroid   . H/O cesarean section 2010  . History of blood transfusion   . Nausea    d/t to "issues with gallballader"  . 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 Tubal pregnancy   . Vaginal odor   . Vaginitis     Allergies  Allergen Reactions  . Penicillins Hives and Shortness Of Breath    Did it involve swelling of the face/tongue/throat, SOB, or low BP? Yes Did it involve sudden or severe rash/hives, skin peeling, or any reaction on the inside of your mouth or nose? No Did you need to seek medical attention at a hospital or doctor's office? No When did it last happen?2020 If all above answers are "NO", may proceed with cephalosporin use.     ROS General: Denies fever, chills, night sweats, changes in weight, changes in appetite  +fatigue, decreased concentration HEENT: Denies headaches, ear pain, changes in vision, rhinorrhea, sore throat CV: Denies CP,  palpitations, SOB, orthopnea Pulm: Denies SOB, cough, wheezing GI: Denies abdominal pain, nausea, vomiting, diarrhea, constipation GU: Denies dysuria, hematuria, frequency, vaginal discharge Msk: Denies muscle cramps, joint pains Neuro: Denies weakness, numbness, tingling  +fasiculations Skin: Denies rashes, bruising Psych: Denies depression,hallucinations    + anxiety    Objective:    Blood pressure 102/76, pulse (!) 119, temperature 98.5 F (36.9 C), temperature source Oral, weight 158 lb 9.6 oz (71.9 kg), SpO2 99 %, not currently breastfeeding.   Gen. Pleasant, well-nourished, in no distress, tearful, anxious HEENT: [place]/AT, face symmetric, conjunctiva clear, no scleral icterus, PERRLA, EOMI, nares patent without drainage. Lungs: no accessory muscle use Cardiovascular: tachycardic, no m/r/g, no peripheral edema Musculoskeletal: No deformities, no cyanosis or clubbing, normal tone Neuro:  A&Ox3, CN II-XII intact, normal gait Skin:  Warm, no lesions/ rash   Wt Readings from Last 3 Encounters:  03/24/20 158 lb 9.6 oz (71.9 kg)  03/18/20 162 lb (73.5 kg)  10/08/19 163 lb (73.9 kg)    Lab Results  Component Value Date   WBC 7.3 10/08/2019   HGB 11.2 (L) 10/08/2019   HCT 34.2 (L) 10/08/2019   PLT 306.0 10/08/2019   GLUCOSE 88 10/08/2019   ALT 15 08/12/2019   AST 17 08/12/2019   NA 137 10/08/2019   K 4.0 10/08/2019  CL 103 10/08/2019   CREATININE 0.86 10/08/2019   BUN 8 10/08/2019   CO2 29 10/08/2019   TSH 1.56 09/04/2019    Assessment/Plan:  Vitamin B12 deficiency  -vit B 12 261 on 09/04/19 -advised to start supplementation as not previously done. -possibly contributing to fasiculations -recheck in the next month. - Plan: cyanocobalamin ((VITAMIN B-12)) injection 1,000 mcg  Anxiety and depression -PHQ 9 score 18 -GAD 7 score 11 -advised to consider counseling.  Pt hesitant. -Advised to consider other treatment options including medication. -continue to  mnoitor -given precautions  Concentration deficit -given Adderall 5 mg initially as pt was concerned about S/Es.  After tolerated, pt increased dose to 10 mg. -will increase dose of Adderall to 15 mg BID. -if no difference in concentration noted, consider treating anxiety and depression.   - Plan: amphetamine-dextroamphetamine (ADDERALL) 15 MG tablet  Fasciculations -Possibly 2/2 history of vitamin B12 deficiency -Would obtain additional labs to rule out other causes including electrolyte deficiency, other vitamin deficiency, thyroid dysfunction -Continue to monitor  Systemic lupus erythematous, unspecified SLE type, unspecified organ involvement status([facility]) -Concerned symptoms may be related to another autoimmune d/o based on recent labs per rheumatology -Continue follow-up with rheumatology  F/u in 2-4 wks, sooner if needed.  Grier Mitts, MD

## 2020-03-24 NOTE — Patient Instructions (Addendum)
Vitamin B12 Deficiency Vitamin B12 deficiency occurs when the body does not have enough vitamin B12, which is an important vitamin. The body needs this vitamin:  To make red blood cells.  To make DNA. This is the genetic material inside cells.  To help the nerves work properly so they can carry messages from the brain to the body. Vitamin B12 deficiency can cause various health problems, such as a low red blood cell count (anemia) or nerve damage. What are the causes? This condition may be caused by:  Not eating enough foods that contain vitamin B12.  Not having enough stomach acid and digestive fluids to properly absorb vitamin B12 from the food that you eat.  Certain digestive system diseases that make it hard to absorb vitamin B12. These diseases include Crohn's disease, chronic pancreatitis, and cystic fibrosis.  A condition in which the body does not make enough of a protein (intrinsic factor), resulting in too few red blood cells (pernicious anemia).  Having a surgery in which part of the stomach or small intestine is removed.  Taking certain medicines that make it hard for the body to absorb vitamin B12. These medicines include: ? Heartburn medicines (antacids and proton pump inhibitors). ? Certain antibiotic medicines. ? Some medicines that are used to treat diabetes, tuberculosis, gout, or high cholesterol. What increases the risk? The following factors may make you more likely to develop a B12 deficiency:  Being older than age 28.  Eating a vegetarian or vegan diet, especially while you are pregnant.  Eating a poor diet while you are pregnant.  Taking certain medicines.  Having alcoholism. What are the signs or symptoms? In some cases, there are no symptoms of this condition. If the condition leads to anemia or nerve damage, various symptoms can occur, such as:  Weakness.  Fatigue.  Loss of appetite.  Weight loss.  Numbness or tingling in your hands and  feet.  Redness and burning of the tongue.  Confusion or memory problems.  Depression.  Sensory problems, such as color blindness, ringing in the ears, or loss of taste.  Diarrhea or constipation.  Trouble walking. If anemia is severe, symptoms can include:  Shortness of breath.  Dizziness.  Rapid heart rate (tachycardia). How is this diagnosed? This condition may be diagnosed with a blood test to measure the level of vitamin B12 in your blood. You may also have other tests, including:  A group of tests that measure certain characteristics of blood cells (complete blood count, CBC).  A blood test to measure intrinsic factor.  A procedure where a thin tube with a camera on the end is used to look into your stomach or intestines (endoscopy). Other tests may be needed to discover the cause of B12 deficiency. How is this treated? Treatment for this condition depends on the cause. This condition may be treated by:  Changing your eating and drinking habits, such as: ? Eating more foods that contain vitamin B12. ? Drinking less alcohol or no alcohol.  Getting vitamin B12 injections.  Taking vitamin B12 supplements. Your health care provider will tell you which dosage is best for you. Follow these instructions at home: Eating and drinking   Eat lots of healthy foods that contain vitamin B12, including: ? Meats and poultry. This includes beef, pork, chicken, [place], and organ meats, such as liver. ? Seafood. This includes clams, rainbow trout, salmon, tuna, and haddock. ? Eggs. ? Cereal and dairy products that are fortified. This means that vitamin B12  has been added to the food. Check the label on the package to see if the food is fortified. The items listed above may not be a complete list of recommended foods and beverages. Contact a dietitian for more information. General instructions  Get any injections that are prescribed by your health care provider.  Take  supplements only as told by your health care provider. Follow the directions carefully.  Do not drink alcohol if your health care provider tells you not to. In some cases, you may only be asked to limit alcohol use.  Keep all follow-up visits as told by your health care provider. This is important. Contact a health care provider if:  Your symptoms come back. Get help right away if you:  Develop shortness of breath.  Have a rapid heart rate.  Have chest pain.  Become dizzy or lose consciousness. Summary  Vitamin B12 deficiency occurs when the body does not have enough vitamin B12.  The main causes of vitamin B12 deficiency include dietary deficiency, digestive diseases, pernicious anemia, and having a surgery in which part of the stomach or small intestine is removed.  In some cases, there are no symptoms of this condition. If the condition leads to anemia or nerve damage, various symptoms can occur, such as weakness, shortness of breath, and numbness.  Treatment may include getting vitamin B12 injections or taking vitamin B12 supplements. Eat lots of healthy foods that contain vitamin B12. This information is not intended to replace advice given to you by your health care provider. Make sure you discuss any questions you have with your health care provider. Document Revised: 10/11/2018 Document Reviewed: 01/01/2018 Elsevier Patient Education  [place], Adult After being diagnosed with an anxiety disorder, you may be relieved to know why you have felt or behaved a certain way. You may also feel overwhelmed about the treatment ahead and what it will mean for your life. With care and support, you can manage this condition and recover from it. How to manage lifestyle changes Managing stress and anxiety  Stress is your body's reaction to life changes and events, both good and bad. Most stress will last just a few hours, but stress can be ongoing and can lead  to more than just stress. Although stress can play a major role in anxiety, it is not the same as anxiety. Stress is usually caused by something external, such as a deadline, test, or competition. Stress normally passes after the triggering event has ended.  Anxiety is caused by something internal, such as imagining a terrible outcome or worrying that something will go wrong that will devastate you. Anxiety often does not go away even after the triggering event is over, and it can become long-term (chronic) worry. It is important to understand the differences between stress and anxiety and to manage your stress effectively so that it does not lead to an anxious response. Talk with your health care provider or a counselor to learn more about reducing anxiety and stress. He or she may suggest tension reduction techniques, such as:  Music therapy. This can include creating or listening to music that you enjoy and that inspires you.  Mindfulness-based meditation. This involves being aware of your normal breaths while not trying to control your breathing. It can be done while sitting or walking.  Centering prayer. This involves focusing on a word, phrase, or sacred image that means something to you and brings you peace.  Deep breathing. To do  this, expand your stomach and inhale slowly through your nose. Hold your breath for 3-5 seconds. Then exhale slowly, letting your stomach muscles relax.  Self-talk. This involves identifying thought patterns that lead to anxiety reactions and changing those patterns.  Muscle relaxation. This involves tensing muscles and then relaxing them. Choose a tension reduction technique that suits your lifestyle and personality. These techniques take time and practice. Set aside 5-15 minutes a day to do them. Therapists can offer counseling and training in these techniques. The training to help with anxiety may be covered by some insurance plans. Other things you can do to  manage stress and anxiety include:  Keeping a stress/anxiety diary. This can help you learn what triggers your reaction and then learn ways to manage your response.  Thinking about how you react to certain situations. You may not be able to control everything, but you can control your response.  Making time for activities that help you relax and not feeling guilty about spending your time in this way.  Visual imagery and yoga can help you stay calm and relax.  Medicines Medicines can help ease symptoms. Medicines for anxiety include:  Anti-anxiety drugs.  Antidepressants. Medicines are often used as a primary treatment for anxiety disorder. Medicines will be prescribed by a health care provider. When used together, medicines, psychotherapy, and tension reduction techniques may be the most effective treatment. Relationships Relationships can play a big part in helping you recover. Try to spend more time connecting with trusted friends and family members. Consider going to couples counseling, taking family education classes, or going to family therapy. Therapy can help you and others better understand your condition. How to recognize changes in your anxiety Everyone responds differently to treatment for anxiety. Recovery from anxiety happens when symptoms decrease and stop interfering with your daily activities at home or work. This may mean that you will start to:  Have better concentration and focus. Worry will interfere less in your daily thinking.  Sleep better.  Be less irritable.  Have more energy.  Have improved memory. It is important to recognize when your condition is getting worse. Contact your health care provider if your symptoms interfere with home or work and you feel like your condition is not improving. Follow these instructions at home: Activity  Exercise. Most adults should do the following: ? Exercise for at least 150 minutes each week. The exercise should  increase your heart rate and make you sweat (moderate-intensity exercise). ? Strengthening exercises at least twice a week.  Get the right amount and quality of sleep. Most adults need 7-9 hours of sleep each night. Lifestyle   Eat a healthy diet that includes plenty of vegetables, fruits, whole grains, low-fat dairy products, and lean protein. Do not eat a lot of foods that are high in solid fats, added sugars, or salt.  Make choices that simplify your life.  Do not use any products that contain nicotine or tobacco, such as cigarettes, e-cigarettes, and chewing tobacco. If you need help quitting, ask your health care provider.  Avoid caffeine, alcohol, and certain over-the-counter cold medicines. These may make you feel worse. Ask your pharmacist which medicines to avoid. General instructions  Take over-the-counter and prescription medicines only as told by your health care provider.  Keep all follow-up visits as told by your health care provider. This is important. Where to find support You can get help and support from these sources:  Self-help groups.  Online and [employer].  A  trusted spiritual leader.  Couples counseling.  Family education classes.  Family therapy. Where to find more information You may find that joining a support group helps you deal with your anxiety. The following sources can help you locate counselors or support groups near you:  [place]: www.mentalhealthamerica.net  Anxiety and Depression Association of [place] (ADAA): https://www.clark.net/  National Alliance on Mental Illness (NAMI): www.nami.org Contact a health care provider if you:  Have a hard time staying focused or finishing daily tasks.  Spend many hours a day feeling worried about everyday life.  Become exhausted by worry.  Start to have headaches, feel tense, or have nausea.  Urinate more than normal.  Have diarrhea. Get help right away if you have:  A  racing heart and shortness of breath.  Thoughts of hurting yourself or others. If you ever feel like you may hurt yourself or others, or have thoughts about taking your own life, get help right away. You can go to your nearest emergency department or call:  Your local emergency services (911 in the [place]  A suicide crisis helpline, such as the [facility] at 508 232 5320. This is open 24 hours a day. Summary  Taking steps to learn and use tension reduction techniques can help calm you and help prevent triggering an anxiety reaction.  When used together, medicines, psychotherapy, and tension reduction techniques may be the most effective treatment.  Family, friends, and partners can play a big part in helping you recover from an anxiety disorder. This information is not intended to replace advice given to you by your health care provider. Make sure you discuss any questions you have with your health care provider. Document Revised: 09/24/2018 Document Reviewed: 09/24/2018 Elsevier Patient Education  [place] With Depression Everyone experiences occasional disappointment, sadness, and loss in their lives. When you are feeling down, blue, or sad for at least 2 weeks in a row, it may mean that you have depression. Depression can affect your thoughts and feelings, relationships, daily activities, and physical health. It is caused by changes in the way your brain functions. If you receive a diagnosis of depression, your health care provider will tell you which type of depression you have and what treatment options are available to you. If you are living with depression, there are ways to help you recover from it and also ways to prevent it from coming back. How to cope with lifestyle changes Coping with stress     Stress is your body's reaction to life changes and events, both good and bad. Stressful situations may include:  Getting  married.  The death of a spouse.  Losing a job.  Retiring.  Having a baby. Stress can last just a few hours or it can be ongoing. Stress can play a major role in depression, so it is important to learn both how to cope with stress and how to think about it differently. Talk with your health care provider or a counselor if you would like to learn more about stress reduction. He or she may suggest some stress reduction techniques, such as:  Music therapy. This can include creating music or listening to music. Choose music that you enjoy and that inspires you.  Mindfulness-based meditation. This kind of meditation can be done while sitting or walking. It involves being aware of your normal breaths, rather than trying to control your breathing.  Centering prayer. This is a kind of meditation that involves focusing on a  spiritual word or phrase. Choose a word, phrase, or sacred image that is meaningful to you and that brings you peace.  Deep breathing. To do this, expand your stomach and inhale slowly through your nose. Hold your breath for 3-5 seconds, then exhale slowly, allowing your stomach muscles to relax.  Muscle relaxation. This involves intentionally tensing muscles then relaxing them. Choose a stress reduction technique that fits your lifestyle and personality. Stress reduction techniques take time and practice to develop. Set aside 5-15 minutes a day to do them. Therapists can offer training in these techniques. The training may be covered by some insurance plans. Other things you can do to manage stress include:  Keeping a stress diary. This can help you learn what triggers your stress and ways to control your response.  Understanding what your limits are and saying no to requests or events that lead to a schedule that is too full.  Thinking about how you respond to certain situations. You may not be able to control everything, but you can control how you react.  Adding humor to  your life by watching funny films or TV shows.  Making time for activities that help you relax and not feeling guilty about spending your time this way.  Medicines Your health care provider may suggest certain medicines if he or she feels that they will help improve your condition. Avoid using alcohol and other substances that may prevent your medicines from working properly (may interact). It is also important to:  Talk with your pharmacist or health care provider about all the medicines that you take, their possible side effects, and what medicines are safe to take together.  Make it your goal to take part in all treatment decisions (shared decision-making). This includes giving input on the side effects of medicines. It is best if shared decision-making with your health care provider is part of your total treatment plan. If your health care provider prescribes a medicine, you may not notice the full benefits of it for 4-8 weeks. Most people who are treated for depression need to be on medicine for at least 6-12 months after they feel better. If you are taking medicines as part of your treatment, do not stop taking medicines without first talking to your health care provider. You may need to have the medicine slowly decreased (tapered) over time to decrease the risk of harmful side effects. Relationships Your health care provider may suggest family therapy along with individual therapy and drug therapy. While there may not be family problems that are causing you to feel depressed, it is still important to make sure your family learns as much as they can about your mental health. Having your family's support can help make your treatment successful. How to recognize changes in your condition Everyone has a different response to treatment for depression. Recovery from major depression happens when you have not had signs of major depression for two months. This may mean that you will start to:  Have  more interest in doing activities.  Feel less hopeless than you did 2 months ago.  Have more energy.  Overeat less often, or have better or improving appetite.  Have better concentration. Your health care provider will work with you to decide the next steps in your recovery. It is also important to recognize when your condition is getting worse. Watch for these signs:  Having fatigue or low energy.  Eating too much or too little.  Sleeping too much or too little.  Feeling restless, agitated, or hopeless.  Having trouble concentrating or making decisions.  Having unexplained physical complaints.  Feeling irritable, angry, or aggressive. Get help as soon as you or your family members notice these symptoms coming back. How to get support and help from others How to talk with friends and family members about your condition  Talking to friends and family members about your condition can provide you with one way to get support and guidance. Reach out to trusted friends or family members, explain your symptoms to them, and let them know that you are working with a health care provider to treat your depression. Financial resources Not all insurance plans cover mental health care, so it is important to check with your insurance carrier. If paying for co-pays or counseling services is a problem, search for a local or county mental health care center. They may be able to offer public mental health care services at low or no cost when you are not able to see a private health care provider. If you are taking medicine for depression, you may be able to get the generic form, which may be less expensive. Some makers of prescription medicines also offer help to patients who cannot afford the medicines they need. Follow these instructions at home:   Get the right amount and quality of sleep.  Cut down on using caffeine, tobacco, alcohol, and other potentially harmful substances.  Try to  exercise, such as walking or lifting small weights.  Take over-the-counter and prescription medicines only as told by your health care provider.  Eat a healthy diet that includes plenty of vegetables, fruits, whole grains, low-fat dairy products, and lean protein. Do not eat a lot of foods that are high in solid fats, added sugars, or salt.  Keep all follow-up visits as told by your health care provider. This is important. Contact a health care provider if:  You stop taking your antidepressant medicines, and you have any of these symptoms: ? Nausea. ? Headache. ? Feeling lightheaded. ? Chills and body aches. ? Not being able to sleep (insomnia).  You or your friends and family think your depression is getting worse. Get help right away if:  You have thoughts of hurting yourself or others. If you ever feel like you may hurt yourself or others, or have thoughts about taking your own life, get help right away. You can go to your nearest emergency department or call:  Your local emergency services (911 in the [place]  A suicide crisis helpline, such as the [place] at (308)711-5927. This is open 24-hours a day. Summary  If you are living with depression, there are ways to help you recover from it and also ways to prevent it from coming back.  Work with your health care team to create a management plan that includes counseling, stress management techniques, and healthy lifestyle habits. This information is not intended to replace advice given to you by your health care provider. Make sure you discuss any questions you have with your health care provider. Document Revised: 08/16/2018 Document Reviewed: 03/27/2016 Elsevier Patient Education  [place].

## 2020-03-25 ENCOUNTER — Telehealth: Payer: Self-pay | Admitting: Family Medicine

## 2020-03-25 ENCOUNTER — Ambulatory Visit: Payer: 59 | Admitting: Family Medicine

## 2020-03-25 NOTE — Telephone Encounter (Signed)
Patient is calling and stated that the prescription for Adderall was sent to [facility] but they are out and pt wanted to see if it can be transferred to [facility] on [address], please advise. CB is [phone]

## 2020-03-25 NOTE — Telephone Encounter (Signed)
Spoke with pt pharmacy state that pt Rx was ready for pick up, Called pt advised to pick up Rx at [facility]. Verbalized understanding

## 2020-03-26 ENCOUNTER — Telehealth: Payer: Self-pay | Admitting: Family Medicine

## 2020-03-26 NOTE — Telephone Encounter (Signed)
Spoke with pt state she is not feeling any better and that she feels worse than she did before. Pt state that she started the new dose this afternoon but is still in bed state that she feels tired and want advise on what she needs to do.

## 2020-03-26 NOTE — Telephone Encounter (Signed)
Caitlyn Roy is calling and stated that she seen Dr. Volanda Napoleon and is feeling worse. Pt is requesting a call back, please advise. CB is [phone]

## 2020-03-29 DIAGNOSIS — R42 Dizziness and giddiness: Secondary | ICD-10-CM | POA: Insufficient documentation

## 2020-03-29 DIAGNOSIS — R202 Paresthesia of skin: Secondary | ICD-10-CM | POA: Diagnosis not present

## 2020-03-29 DIAGNOSIS — R000 Tachycardia, unspecified: Secondary | ICD-10-CM | POA: Insufficient documentation

## 2020-03-29 DIAGNOSIS — R110 Nausea: Secondary | ICD-10-CM | POA: Insufficient documentation

## 2020-03-29 DIAGNOSIS — R531 Weakness: Secondary | ICD-10-CM | POA: Insufficient documentation

## 2020-03-29 DIAGNOSIS — R519 Headache, unspecified: Secondary | ICD-10-CM | POA: Insufficient documentation

## 2020-03-29 DIAGNOSIS — R350 Frequency of micturition: Secondary | ICD-10-CM | POA: Diagnosis not present

## 2020-03-30 ENCOUNTER — Encounter: Payer: Self-pay | Admitting: Family Medicine

## 2020-03-30 ENCOUNTER — Other Ambulatory Visit: Payer: Self-pay

## 2020-03-30 ENCOUNTER — Emergency Department (HOSPITAL_COMMUNITY)
Admission: EM | Admit: 2020-03-30 | Discharge: 2020-03-30 | Disposition: A | Discharge status: Home / Self Care | Payer: 59 | Source: Home / Self Care

## 2020-03-30 ENCOUNTER — Encounter (HOSPITAL_COMMUNITY): Payer: Self-pay

## 2020-03-30 ENCOUNTER — Emergency Department (HOSPITAL_COMMUNITY)
Admission: EM | Admit: 2020-03-30 | Discharge: 2020-03-30 | Disposition: A | Discharge status: Home / Self Care | Payer: 59 | Attending: Emergency Medicine | Admitting: Emergency Medicine

## 2020-03-30 ENCOUNTER — Encounter (HOSPITAL_COMMUNITY): Payer: Self-pay | Admitting: Emergency Medicine

## 2020-03-30 DIAGNOSIS — E860 Dehydration: Secondary | ICD-10-CM

## 2020-03-30 DIAGNOSIS — R350 Frequency of micturition: Secondary | ICD-10-CM | POA: Insufficient documentation

## 2020-03-30 DIAGNOSIS — R519 Headache, unspecified: Secondary | ICD-10-CM

## 2020-03-30 DIAGNOSIS — T887XXA Unspecified adverse effect of drug or medicament, initial encounter: Secondary | ICD-10-CM

## 2020-03-30 DIAGNOSIS — Z5321 Procedure and treatment not carried out due to patient leaving prior to being seen by health care provider: Secondary | ICD-10-CM | POA: Insufficient documentation

## 2020-03-30 DIAGNOSIS — R42 Dizziness and giddiness: Secondary | ICD-10-CM | POA: Insufficient documentation

## 2020-03-30 DIAGNOSIS — R531 Weakness: Secondary | ICD-10-CM | POA: Insufficient documentation

## 2020-03-30 HISTORY — DX: Attention-deficit hyperactivity disorder, unspecified type: F90.9

## 2020-03-30 LAB — URINALYSIS, ROUTINE W REFLEX MICROSCOPIC
Bilirubin Urine: NEGATIVE
Glucose, UA: NEGATIVE mg/dL
Ketones, ur: 20 mg/dL — AB
Leukocytes,Ua: NEGATIVE
Nitrite: NEGATIVE
Protein, ur: NEGATIVE mg/dL
Specific Gravity, Urine: 1.004 — ABNORMAL LOW (ref 1.005–1.030)
pH: 5 (ref 5.0–8.0)

## 2020-03-30 LAB — CBC WITH DIFFERENTIAL/PLATELET
Abs Immature Granulocytes: 0.01 10*3/uL (ref 0.00–0.07)
Basophils Absolute: 0 10*3/uL (ref 0.0–0.1)
Basophils Relative: 1 %
Eosinophils Absolute: 0.4 10*3/uL (ref 0.0–0.5)
Eosinophils Relative: 5 %
HCT: 34.8 % — ABNORMAL LOW (ref 36.0–46.0)
Hemoglobin: 10.8 g/dL — ABNORMAL LOW (ref 12.0–15.0)
Immature Granulocytes: 0 %
Lymphocytes Relative: 46 %
Lymphs Abs: 3.7 10*3/uL (ref 0.7–4.0)
MCH: 26.3 pg (ref 26.0–34.0)
MCHC: 31 g/dL (ref 30.0–36.0)
MCV: 84.7 fL (ref 80.0–100.0)
Monocytes Absolute: 0.5 10*3/uL (ref 0.1–1.0)
Monocytes Relative: 6 %
Neutro Abs: 3.4 10*3/uL (ref 1.7–7.7)
Neutrophils Relative %: 42 %
Platelets: 384 10*3/uL (ref 150–400)
RBC: 4.11 MIL/uL (ref 3.87–5.11)
RDW: 14 % (ref 11.5–15.5)
WBC: 8 10*3/uL (ref 4.0–10.5)
nRBC: 0 % (ref 0.0–0.2)

## 2020-03-30 LAB — BASIC METABOLIC PANEL
Anion gap: 11 (ref 5–15)
BUN: 6 mg/dL (ref 6–20)
CO2: 21 mmol/L — ABNORMAL LOW (ref 22–32)
Calcium: 9.2 mg/dL (ref 8.9–10.3)
Chloride: 102 mmol/L (ref 98–111)
Creatinine, Ser: 0.89 mg/dL (ref 0.44–1.00)
GFR, Estimated: >60 mL/min (ref >60)
Glucose, Bld: 94 mg/dL (ref 70–99)
Potassium: 3.2 mmol/L — ABNORMAL LOW (ref 3.5–5.1)
Sodium: 134 mmol/L — ABNORMAL LOW (ref 135–145)

## 2020-03-30 LAB — I-STAT BETA HCG BLOOD, ED (MC, WL, AP ONLY): I-stat hCG, quantitative: <5 m[IU]/mL (ref <5)

## 2020-03-30 LAB — CBG MONITORING, ED
Glucose-Capillary: 66 mg/dL — ABNORMAL LOW (ref 70–99)
Glucose-Capillary: 91 mg/dL (ref 70–99)

## 2020-03-30 MED ORDER — NITROFURANTOIN MONOHYD MACRO 100 MG PO CAPS: 100.0000 mg | ORAL_CAPSULE | Freq: Two times a day (BID) | ORAL | 0 refills | Status: DC

## 2020-03-30 MED ORDER — METOCLOPRAMIDE HCL 5 MG/ML IJ SOLN
10.0000 mg | Freq: Once | INTRAMUSCULAR | Status: AC
  Administered 2020-03-30: 10 mg via INTRAVENOUS
  Filled 2020-03-30: qty 2

## 2020-03-30 MED ORDER — NITROFURANTOIN MONOHYD MACRO 100 MG PO CAPS: 100.0000 mg | ORAL_CAPSULE | Freq: Two times a day (BID) | ORAL | 0 refills | Status: AC

## 2020-03-30 MED ORDER — NITROFURANTOIN MONOHYD MACRO 100 MG PO CAPS
100.0000 mg | ORAL_CAPSULE | Freq: Once | ORAL | Status: AC
  Administered 2020-03-30: 100 mg via ORAL
  Filled 2020-03-30: qty 1

## 2020-03-30 MED ORDER — OXYCODONE-ACETAMINOPHEN 5-325 MG PO TABS
1.0000 | ORAL_TABLET | Freq: Once | ORAL | Status: AC
  Administered 2020-03-30: 1 via ORAL
  Filled 2020-03-30: qty 1

## 2020-03-30 MED ORDER — LACTATED RINGERS IV BOLUS
1000.0000 mL | Freq: Once | INTRAVENOUS | Status: AC
  Administered 2020-03-30: 1000 mL via INTRAVENOUS

## 2020-03-30 MED ORDER — DIPHENHYDRAMINE HCL 50 MG/ML IJ SOLN
25.0000 mg | Freq: Once | INTRAMUSCULAR | Status: AC
  Administered 2020-03-30: 25 mg via INTRAVENOUS
  Filled 2020-03-30: qty 1

## 2020-03-30 MED ORDER — DEXAMETHASONE SODIUM PHOSPHATE 10 MG/ML IJ SOLN
10.0000 mg | Freq: Once | INTRAMUSCULAR | Status: AC
  Administered 2020-03-30: 10 mg via INTRAVENOUS
  Filled 2020-03-30: qty 1

## 2020-03-30 NOTE — ED Notes (Signed)
Called to reassess vitals w/o response

## 2020-03-30 NOTE — ED Notes (Signed)
No answer when called to treatment area

## 2020-03-30 NOTE — ED Triage Notes (Signed)
Pt reports headache, weakness, frequent urination and dizziness that started when she got home from being discharged yesterday. No nausea today. Pt called her PCP to follow up and they told her to come back here.

## 2020-03-30 NOTE — ED Triage Notes (Signed)
Patient reports headache this evening , no head injury , denies emesis or photophobia , patient stated she was diagnosed with ADHD prescribed with Aderal .

## 2020-03-30 NOTE — ED Provider Notes (Signed)
[facility] EMERGENCY DEPARTMENT Provider Note   CSN: [id number] Arrival date & time: 03/29/20  2357     History Chief Complaint  Patient presents with  . Headache    Caitlyn Roy is a 34 y.o. female.  34 year old female without significant past medical history who presents to the emergency department today secondary to headache, nausea, paresthesias in her scalp and on her left shoulder.  Apparently patient recently started Adderall couple weeks ago and has gone from 5-10 to 15 to 30 mg a day with a 30 within the last day.  She states she has had some weight loss during this time and she had a very decreased appetite.  She is not been drinking much fluids either.  She is usually been urinating more than normal.  She states that she has some dysuria.  Started back to work today.  No chest pain or palpitations.  No other associated symptoms.   Headache      Past Medical History:  Diagnosis Date  . ADHD   . Anemia   . Anginal pain ([place])    being followed by cardiologists  . Anxiety   . Depression   . Dysrhythmia    palpitation   . Fibroid   . H/O cesarean section 2010  . History of blood transfusion   . Nausea    d/t to "issues with gallballader"  . 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 Tubal pregnancy   . Vaginal odor   . Vaginitis     Patient Active Problem List   Diagnosis Date Noted  . Concentration deficit 03/24/2020  . Systemic lupus erythematosus ([place]) 03/18/2020  . Ectopic pregnancy 08/07/2018  . S/P repeat low transverse C-section 04/06/2018  . H/O: cesarean section 04/06/2018  . Goiter 08/16/2017  . Acute appendicitis 10/20/2016  . Iron deficiency anemia 03/03/2016  . Palpitations 03/03/2016  . BV (bacterial vaginosis) 10/15/2014    Past Surgical History:  Procedure Laterality Date  . BIOPSY EYE MUSCLE    . CESAREAN SECTION  2010  . CESAREAN SECTION N/A  04/06/2018   Procedure: CESAREAN SECTION bladder irrigation;  Surgeon: [name], MD;  Location: [facility];  Service: Obstetrics;  Laterality: N/A;  needs RNFA  . CHOLECYSTECTOMY N/A 08/14/2019   Procedure: LAPAROSCOPIC CHOLECYSTECTOMY;  Surgeon: Greer Pickerel, MD;  [facility];  Service: General;  Laterality: N/A;  . DIAGNOSTIC LAPAROSCOPY WITH REMOVAL OF ECTOPIC PREGNANCY N/A 08/07/2018   Procedure: DIAGNOSTIC LAPAROSCOPY WITH REMOVAL OF ECTOPIC PREGNANCY;  Surgeon: Janyth Pupa, DO;  Location: [facility];  Service: Gynecology;  Laterality: N/A;  . LAPAROSCOPIC APPENDECTOMY N/A 10/20/2016   Procedure: APPENDECTOMY LAPAROSCOPIC;  Surgeon: Judeth Horn, MD;  Location: [facility];  Service: General;  Laterality: N/A;  . LAPAROSCOPIC SALPINGO OOPHERECTOMY Left 08/07/2018   Procedure: LAPAROSCOPIC SALPINGECTOMY LEFT ;  Surgeon: Janyth Pupa, DO;  Location: [facility];  Service: Gynecology;  Laterality: Left;     OB History    Gravida  9   Para  2   Term  2   Preterm      AB  6   Living  2     SAB  4   TAB  1   Ectopic  1   Multiple  0   Live Births  2           Family History  Problem Relation Age of Onset  . Hypertension Mother   .  Breast cancer Mother   . Heart attack Mother   . Seizures Mother   . Alcohol abuse Father   . Kidney disease Maternal Grandmother   . Diabetes Paternal Grandmother     Social History   Tobacco Use  . Smoking status: Never Smoker  . Smokeless tobacco: Never Used  Vaping Use  . Vaping Use: Never used  Substance Use Topics  . Alcohol use: Yes    Comment: 2 times per week   . Drug use: No    Home Medications Prior to Admission medications   Medication Sig Start Date End Date Taking? Authorizing Provider  amphetamine-dextroamphetamine (ADDERALL) 15 MG tablet Take 1 tablet by mouth daily. 03/24/20  Yes Billie Ruddy, MD    Allergies    Penicillins  Review of Systems   Review of Systems  Neurological: Positive for  headaches.  All other systems reviewed and are negative.   Physical Exam Updated Vital Signs BP 127/76 (BP Location: Left Arm)   Pulse (!) 102   Temp 98.2 F (36.8 C) (Oral)   Resp 18   LMP 03/16/2020   SpO2 100%   Physical Exam Vitals and nursing note reviewed.  Constitutional:      Appearance: She is well-developed.  HENT:     Head: Normocephalic and atraumatic.     Mouth/Throat:     Mouth: Mucous membranes are moist.     Pharynx: Oropharynx is clear.  Eyes:     Pupils: Pupils are equal, round, and reactive to light.  Cardiovascular:     Rate and Rhythm: Regular rhythm. Tachycardia present.  Pulmonary:     Effort: No respiratory distress.     Breath sounds: No stridor.  Abdominal:     General: There is no distension.  Musculoskeletal:        General: No swelling or tenderness. Normal range of motion.     Cervical back: Normal range of motion.  Skin:    General: Skin is warm and dry.  Neurological:     General: No focal deficit present.     Mental Status: She is alert and oriented to person, place, and time.     Cranial Nerves: No cranial nerve deficit.     Sensory: No sensory deficit.     Motor: No weakness.     Coordination: Coordination normal.     Gait: Gait normal.     Deep Tendon Reflexes: Reflexes normal.     Comments: fidgety     ED Results / Procedures / Treatments   Labs (all labs ordered are listed, but only abnormal results are displayed) Labs Reviewed  CBC WITH DIFFERENTIAL/PLATELET - Abnormal; Notable for the following components:      Result Value   Hemoglobin 10.8 (*)    HCT 34.8 (*)    All other components within normal limits  BASIC METABOLIC PANEL - Abnormal; Notable for the following components:   Sodium 134 (*)    Potassium 3.2 (*)    CO2 21 (*)    All other components within normal limits  CBG MONITORING, ED - Abnormal; Notable for the following components:   Glucose-Capillary 66 (*)    All other components within normal limits    URINALYSIS, ROUTINE W REFLEX MICROSCOPIC  I-STAT BETA HCG BLOOD, ED ([facility], [facility], [facility] ONLY)    EKG None  Radiology No results found.  Procedures Procedures (including critical care time)  Medications Ordered in ED Medications  lactated ringers bolus 1,000 mL (has no administration  in time range)  metoCLOPramide (REGLAN) injection 10 mg (has no administration in time range)  diphenhydrAMINE (BENADRYL) injection 25 mg (has no administration in time range)  dexamethasone (DECADRON) injection 10 mg (has no administration in time range)  oxyCODONE-acetaminophen (PERCOCET/ROXICET) 5-325 MG per tablet 1 tablet (1 tablet Oral Given 03/30/20 0009)    ED Course  I have reviewed the triage vital signs and the nursing notes.  Pertinent labs & imaging results that were available during my care of the patient were reviewed by me and considered in my medical decision making (see chart for details).    MDM Rules/Calculators/A&P                          We will check for urinary tract infection but overall I think her symptoms are more consistent with side effects of the Adderall.  We will have her talk to her doctor about adjusting the dose. Otherwise will hydrate, headache cocktail here for ysmptoms.   Final Clinical Impression(s) / ED Diagnoses Final diagnoses:  None    Rx / DC Orders ED Discharge Orders    None       Tobin Cadiente, Corene Cornea, MD 03/30/20 [phone]

## 2020-03-30 NOTE — ED Notes (Signed)
Pt requesting a CT scan of her head

## 2020-03-31 ENCOUNTER — Emergency Department (HOSPITAL_COMMUNITY)
Admission: EM | Admit: 2020-03-31 | Discharge: 2020-03-31 | Disposition: A | Discharge status: Home / Self Care | Payer: 59 | Attending: Emergency Medicine | Admitting: Emergency Medicine

## 2020-03-31 ENCOUNTER — Other Ambulatory Visit: Payer: Self-pay

## 2020-03-31 ENCOUNTER — Emergency Department (HOSPITAL_COMMUNITY): Payer: 59

## 2020-03-31 DIAGNOSIS — R630 Anorexia: Secondary | ICD-10-CM | POA: Insufficient documentation

## 2020-03-31 DIAGNOSIS — R519 Headache, unspecified: Secondary | ICD-10-CM | POA: Diagnosis present

## 2020-03-31 DIAGNOSIS — Z20822 Contact with and (suspected) exposure to covid-19: Secondary | ICD-10-CM | POA: Diagnosis not present

## 2020-03-31 DIAGNOSIS — E876 Hypokalemia: Secondary | ICD-10-CM | POA: Diagnosis not present

## 2020-03-31 DIAGNOSIS — R42 Dizziness and giddiness: Secondary | ICD-10-CM | POA: Insufficient documentation

## 2020-03-31 LAB — RESPIRATORY PANEL BY RT PCR (FLU A&B, COVID)
Influenza A by PCR: NEGATIVE
Influenza B by PCR: NEGATIVE
SARS Coronavirus 2 by RT PCR: NEGATIVE

## 2020-03-31 LAB — CBC WITH DIFFERENTIAL/PLATELET
Abs Immature Granulocytes: 0.03 10*3/uL (ref 0.00–0.07)
Basophils Absolute: 0.1 10*3/uL (ref 0.0–0.1)
Basophils Relative: 1 %
Eosinophils Absolute: 0.2 10*3/uL (ref 0.0–0.5)
Eosinophils Relative: 2 %
HCT: 34.8 % — ABNORMAL LOW (ref 36.0–46.0)
Hemoglobin: 10.7 g/dL — ABNORMAL LOW (ref 12.0–15.0)
Immature Granulocytes: 0 %
Lymphocytes Relative: 40 %
Lymphs Abs: 3.6 10*3/uL (ref 0.7–4.0)
MCH: 26.4 pg (ref 26.0–34.0)
MCHC: 30.7 g/dL (ref 30.0–36.0)
MCV: 85.7 fL (ref 80.0–100.0)
Monocytes Absolute: 0.5 10*3/uL (ref 0.1–1.0)
Monocytes Relative: 6 %
Neutro Abs: 4.6 10*3/uL (ref 1.7–7.7)
Neutrophils Relative %: 51 %
Platelets: 342 10*3/uL (ref 150–400)
RBC: 4.06 MIL/uL (ref 3.87–5.11)
RDW: 13.9 % (ref 11.5–15.5)
WBC: 9 10*3/uL (ref 4.0–10.5)
nRBC: 0 % (ref 0.0–0.2)

## 2020-03-31 LAB — URINALYSIS, ROUTINE W REFLEX MICROSCOPIC
Bilirubin Urine: NEGATIVE
Glucose, UA: NEGATIVE mg/dL
Ketones, ur: 5 mg/dL — AB
Leukocytes,Ua: NEGATIVE
Nitrite: NEGATIVE
Protein, ur: NEGATIVE mg/dL
Specific Gravity, Urine: 1.004 — ABNORMAL LOW (ref 1.005–1.030)
pH: 6 (ref 5.0–8.0)

## 2020-03-31 LAB — BASIC METABOLIC PANEL
Anion gap: 15 (ref 5–15)
BUN: <5 mg/dL — ABNORMAL LOW (ref 6–20)
CO2: 23 mmol/L (ref 22–32)
Calcium: 9.4 mg/dL (ref 8.9–10.3)
Chloride: 103 mmol/L (ref 98–111)
Creatinine, Ser: 0.84 mg/dL (ref 0.44–1.00)
GFR, Estimated: >60 mL/min (ref >60)
Glucose, Bld: 98 mg/dL (ref 70–99)
Potassium: 3.1 mmol/L — ABNORMAL LOW (ref 3.5–5.1)
Sodium: 141 mmol/L (ref 135–145)

## 2020-03-31 LAB — I-STAT BETA HCG BLOOD, ED (MC, WL, AP ONLY): I-stat hCG, quantitative: <5 m[IU]/mL (ref <5)

## 2020-03-31 LAB — CBG MONITORING, ED: Glucose-Capillary: 83 mg/dL (ref 70–99)

## 2020-03-31 MED ORDER — PROCHLORPERAZINE EDISYLATE 10 MG/2ML IJ SOLN
10.0000 mg | Freq: Once | INTRAMUSCULAR | Status: AC
  Administered 2020-03-31: 10 mg via INTRAVENOUS
  Filled 2020-03-31: qty 2

## 2020-03-31 MED ORDER — DIPHENHYDRAMINE HCL 50 MG/ML IJ SOLN
50.0000 mg | Freq: Once | INTRAMUSCULAR | Status: AC
  Administered 2020-03-31: 50 mg via INTRAVENOUS
  Filled 2020-03-31: qty 1

## 2020-03-31 MED ORDER — KETOROLAC TROMETHAMINE 15 MG/ML IJ SOLN
15.0000 mg | Freq: Once | INTRAMUSCULAR | Status: AC
  Administered 2020-03-31: 15 mg via INTRAVENOUS
  Filled 2020-03-31: qty 1

## 2020-03-31 MED ORDER — POTASSIUM CHLORIDE ER 10 MEQ PO TBCR: 10.0000 meq | EXTENDED_RELEASE_TABLET | Freq: Every day | ORAL | 0 refills | Status: DC

## 2020-03-31 MED ORDER — SODIUM CHLORIDE 0.9 % IV BOLUS
1000.0000 mL | Freq: Once | INTRAVENOUS | Status: AC
  Administered 2020-03-31: 1000 mL via INTRAVENOUS

## 2020-03-31 NOTE — Discharge Instructions (Addendum)
Your labwork and imaging were reassuring today. Your potassium was mildly decreased - please pick up oral potassium supplements and take as prescribed. You will need to have your potassium level rechecked in 1-2 weeks.   Please follow up with your PCP regarding your ED visit today.   Return to the ED for any worsening symptoms

## 2020-03-31 NOTE — ED Triage Notes (Signed)
C/O headache and weakness. Stated was seen recently for same and stated PCP advice to get a CT scan. Pt endorsed recently stopping Adderall, as advised from prior visit  x 2 days and symptoms are unresolved.

## 2020-03-31 NOTE — ED Notes (Signed)
Pt to ED today for lightheadedness, dizziness, headache, and general weakness that started on 11-20.

## 2020-03-31 NOTE — ED Provider Notes (Signed)
[facility] EMERGENCY DEPARTMENT Provider Note   CSN: [id number] Arrival date & time: 03/31/20  1055     History Chief Complaint  Patient presents with  . Headache  . Weakness    Caitlyn Roy is a 34 y.o. female with PMHx depression, anxiety, ADHD who presents to the ED with complaint of gradual onset, constant, persistent, occipital headache x 3 days. Pt also complains of lightheadedness and lack of appetite. She was seen in the ED in the early morning of 11/23 for same with work up including CBC, BMP, CBG, U/A. CBG was found to be 66; pt had recently started adderall and had not been eating or drinking much. She was treated with food, fluids, and a headache cocktail with improvement in her symptoms. Appears she was also having some urinary complaints and was discharged on macrobid. Pt presented to the ED again later that same day however LWBS. She is here with same symptoms - she states her PCP has specifically requested she get a CT scan of her head. Per chart review "pt requesting a CT scan of her head" in nursing notes from yesterdays visit. There is no telephone note or formal note from PCP regarding CT Head. Pt states improvement in her headache after headache cocktail however it returned immediately when she went home. She denies any vision changes however states her eyes feel "dry." No photophobia, phonophobia, nausea, vomiting, unilateral weakness or numbness, speech changes, facial droop, syncope, or any other associated symptoms.   Pt did see PCP on 11/17 for follow up with complaints of increased fatigue without improvement with increased dose of adderall from 5 mg to 10 mg.  Assessment/Plan: Vitamin B12 deficiency  -vit B 12 261 on 09/04/19 -advised to start supplementation as not previously done. -possibly contributing to fasiculations -recheck in the next month. - Plan: cyanocobalamin ((VITAMIN B-12)) injection 1,000 mcg  Anxiety and  depression -PHQ 9 score 18 -GAD 7 score 11 -advised to consider counseling.  Pt hesitant. -Advised to consider other treatment options including medication. -continue to mnoitor -given precautions  Concentration deficit -given Adderall 5 mg initially as pt was concerned about S/Es.  After tolerated, pt increased dose to 10 mg. -will increase dose of Adderall to 15 mg BID. -if no difference in concentration noted, consider treating anxiety and depression.   - Plan: amphetamine-dextroamphetamine (ADDERALL) 15 MG tablet  Fasciculations -Possibly 2/2 history of vitamin B12 deficiency -Would obtain additional labs to rule out other causes including electrolyte deficiency, other vitamin deficiency, thyroid dysfunction -Continue to monitor  Systemic lupus erythematous, unspecified SLE type, unspecified organ involvement status(H[facility]) -Concerned symptoms may be related to another autoimmune d/o based on recent labs per rheumatology -Continue follow-up with rheumatology  F/u in 2-4 wks, sooner if needed.  The history is provided by the patient and medical records.       Past Medical History:  Diagnosis Date  . ADHD   . Anemia   . Anginal pain ([place])    being followed by cardiologists  . Anxiety   . Depression   . Dysrhythmia    palpitation   . Fibroid   . H/O cesarean section 2010  . History of blood transfusion   . Nausea    d/t to "issues with gallballader"  . 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 Tubal pregnancy   . Vaginal odor   . Vaginitis     Patient Active Problem  List   Diagnosis Date Noted  . Concentration deficit 03/24/2020  . Systemic lupus erythematosus ([place]) 03/18/2020  . Ectopic pregnancy 08/07/2018  . S/P repeat low transverse C-section 04/06/2018  . H/O: cesarean section 04/06/2018  . Goiter 08/16/2017  . Acute appendicitis 10/20/2016  . Iron deficiency anemia 03/03/2016  .  Palpitations 03/03/2016  . BV (bacterial vaginosis) 10/15/2014    Past Surgical History:  Procedure Laterality Date  . BIOPSY EYE MUSCLE    . CESAREAN SECTION  2010  . CESAREAN SECTION N/A 04/06/2018   Procedure: CESAREAN SECTION bladder irrigation;  Surgeon: [name], MD;  Location: [facility];  Service: Obstetrics;  Laterality: N/A;  needs RNFA  . CHOLECYSTECTOMY N/A 08/14/2019   Procedure: LAPAROSCOPIC CHOLECYSTECTOMY;  Surgeon: Greer Pickerel, MD;  [facility];  Service: General;  Laterality: N/A;  . DIAGNOSTIC LAPAROSCOPY WITH REMOVAL OF ECTOPIC PREGNANCY N/A 08/07/2018   Procedure: DIAGNOSTIC LAPAROSCOPY WITH REMOVAL OF ECTOPIC PREGNANCY;  Surgeon: Janyth Pupa, DO;  Location: [facility];  Service: Gynecology;  Laterality: N/A;  . LAPAROSCOPIC APPENDECTOMY N/A 10/20/2016   Procedure: APPENDECTOMY LAPAROSCOPIC;  Surgeon: Judeth Horn, MD;  Location: [place];  Service: General;  Laterality: N/A;  . LAPAROSCOPIC SALPINGO OOPHERECTOMY Left 08/07/2018   Procedure: LAPAROSCOPIC SALPINGECTOMY LEFT ;  Surgeon: Janyth Pupa, DO;  Location: [facility];  Service: Gynecology;  Laterality: Left;     OB History    Gravida  9   Para  2   Term  2   Preterm      AB  6   Living  2     SAB  4   TAB  1   Ectopic  1   Multiple  0   Live Births  2           Family History  Problem Relation Age of Onset  . Hypertension Mother   . Breast cancer Mother   . Heart attack Mother   . Seizures Mother   . Alcohol abuse Father   . Kidney disease Maternal Grandmother   . Diabetes Paternal Grandmother     Social History   Tobacco Use  . Smoking status: Never Smoker  . Smokeless tobacco: Never Used  Vaping Use  . Vaping Use: Never used  Substance Use Topics  . Alcohol use: Yes    Comment: 2 times per week   . Drug use: No    Home Medications Prior to Admission medications   Medication Sig Start Date End Date Taking? Authorizing Provider  chlorhexidine (PERIDEX) 0.12  % solution Use as directed 5 mLs in the mouth or throat 2 (two) times daily.  03/23/20  Yes [provider]  amphetamine-dextroamphetamine (ADDERALL) 15 MG tablet Take 1 tablet by mouth daily. Patient not taking: Reported on 03/31/2020 03/24/20   Billie Ruddy, MD  azithromycin (ZITHROMAX) 250 MG tablet Take 250 mg by mouth as directed. Patient not taking: Reported on 03/31/2020 03/23/20   [provider]  nitrofurantoin, macrocrystal-monohydrate, (MACROBID) 100 MG capsule Take 1 capsule (100 mg total) by mouth 2 (two) times daily for 5 days. X 7 days 03/30/20 04/04/20  Mesner, Corene Cornea, MD  potassium chloride (KLOR-CON) 10 MEQ tablet Take 1 tablet (10 mEq total) by mouth daily for 5 days. 03/31/20 04/05/20  Eustaquio Maize, PA-C    Allergies    Penicillins and Compazine [prochlorperazine]  Review of Systems   Review of Systems  Constitutional: Positive for appetite change and fatigue. Negative for chills and fever.  Eyes: Negative for visual disturbance.  Gastrointestinal: Negative for nausea and vomiting.  Neurological: Positive for light-headedness and headaches. Negative for syncope, speech difficulty, weakness and numbness.  All other systems reviewed and are negative.   Physical Exam Updated Vital Signs BP 124/88 (BP Location: Left Arm)   Pulse 69   Temp 98.6 F (37 C) (Oral)   Resp 16   Ht 5\' 2"  (1.575 m)   Wt 72.6 kg   LMP 03/16/2020   SpO2 100%   BMI 29.26 kg/m   Physical Exam Vitals and nursing note reviewed.  Constitutional:      Appearance: She is not ill-appearing or diaphoretic.  HENT:     Head: Normocephalic and atraumatic.  Eyes:     Conjunctiva/sclera: Conjunctivae normal.     Pupils: Pupils are equal, round, and reactive to light.  Neck:     Meningeal: Brudzinski's sign and Kernig's sign absent.  Cardiovascular:     Rate and Rhythm: Normal rate and regular rhythm.     Heart sounds: Normal heart sounds.  Pulmonary:     Effort:  Pulmonary effort is normal.     Breath sounds: Normal breath sounds.  Abdominal:     Palpations: Abdomen is soft.     Tenderness: There is no abdominal tenderness.  Musculoskeletal:     Cervical back: Neck supple.  Skin:    General: Skin is warm and dry.  Neurological:     Mental Status: She is alert.     Comments: CN 3-12 grossly intact A&O x4 GCS 15 Sensation and strength intact Gait nonataxic including with tandem walking Coordination with finger-to-nose WNL Neg romberg, neg pronator drift     ED Results / Procedures / Treatments   Labs (all labs ordered are listed, but only abnormal results are displayed) Labs Reviewed  BASIC METABOLIC PANEL - Abnormal; Notable for the following components:      Result Value   Potassium 3.1 (*)    BUN <5 (*)    All other components within normal limits  CBC WITH DIFFERENTIAL/PLATELET - Abnormal; Notable for the following components:   Hemoglobin 10.7 (*)    HCT 34.8 (*)    All other components within normal limits  URINALYSIS, ROUTINE W REFLEX MICROSCOPIC - Abnormal; Notable for the following components:   Color, Urine STRAW (*)    Specific Gravity, Urine 1.004 (*)    Hgb urine dipstick MODERATE (*)    Ketones, ur 5 (*)    Bacteria, UA MANY (*)    All other components within normal limits  RESPIRATORY PANEL BY RT PCR (FLU A&B, COVID)  I-STAT BETA HCG BLOOD, ED ([facility], [facility], [facility] ONLY)  CBG MONITORING, ED    EKG EKG Interpretation  Date/Time:  Wednesday March 31 2020 11:56:18 EST Ventricular Rate:  66 PR Interval:    QRS Duration: 72 QT Interval:  392 QTC Calculation: 411 R Axis:   28 Text Interpretation: Sinus arrhythmia Low voltage, precordial leads No significant change since last tracing Confirmed by Lacretia Leigh ([id number]) on 03/31/2020 12:12:32 PM   Radiology CT Head Wo Contrast  Result Date: 03/31/2020 CLINICAL DATA:  New onset headache and weakness. EXAM: CT HEAD WITHOUT CONTRAST TECHNIQUE: Contiguous axial images  were obtained from the base of the skull through the vertex without intravenous contrast. COMPARISON:  12/26/2016. FINDINGS: Brain: The brain shows a normal appearance without evidence of malformation, atrophy, old or acute small or large vessel infarction, mass lesion, hemorrhage, hydrocephalus or extra-axial collection. Vascular: No hyperdense  vessel. No evidence of atherosclerotic calcification. Skull: Normal.  No traumatic finding.  No focal bone lesion. Sinuses/Orbits: Sinuses are clear. Orbits appear normal. Mastoids are clear. Other: None significant IMPRESSION: Normal head CT. Electronically Signed   By: Nelson Chimes M.D.   On: 03/31/2020 14:18    Procedures Procedures (including critical care time)  Medications Ordered in ED Medications  sodium chloride 0.9 % bolus 1,000 mL (1,000 mLs Intravenous New Bag/Given 03/31/20 1616)  ketorolac (TORADOL) 15 MG/ML injection 15 mg (15 mg Intravenous Given 03/31/20 1617)  prochlorperazine (COMPAZINE) injection 10 mg (10 mg Intravenous Given 03/31/20 1616)  diphenhydrAMINE (BENADRYL) injection 50 mg (50 mg Intravenous Given 03/31/20 1617)    ED Course  I have reviewed the triage vital signs and the nursing notes.  Pertinent labs & imaging results that were available during my care of the patient were reviewed by me and considered in my medical decision making (see chart for details).    MDM Rules/Calculators/A&P                          34 year old female presents to the ED today with persistent headache for the past 3 days.  Seen in the ED 3 days ago for same, negative work-up at that time.  Thought that her symptoms were likely related to recent Adderall usage.  She was provided with a headache cocktail with improvement in symptoms and discharged home.  Patient came today with persistent symptoms.  She also complains of fatigue, lack of appetite, lightheadedness.  No syncope.  On arrival to the ED vitals are stable.  She is afebrile,  nontachycardic nontachypneic.  She appears to have acute distress.  She has no focal neuro deficits on exam today no meningeal signs.  She states that her PCP has specifically requested a CT scan of her head however there is no documentation regarding this.  She was in the ED yesterday however left prior to being seen, it is documented that patient specifically requested a CT scan of her head.  This was not done 3 days ago and patient reports that she has never had headaches like this will perform CT head at this time.  Will obtain lab work, orthostatics, swab for [employer].   Orthostatic Lying  BP- Lying:121/72 Pulse- Lying:65 Orthostatic Sitting BP- Sitting:120/75 Pulse- Sitting:68 Orthostatic Standing at 0 minutes BP- Standing at 0 minutes:127/82 Pulse- Standing at 0 minutes:62  CBC without leukocytosis. Hgb stable at 10.7;  Hx of anemia.  BMP with potassium 3.1.  Beta hcg negative U/A without signs of infection.   COVID negative CT Scan negative  Headache cocktail ordered after CT scan returned negative however pt declined medications per nursing staff as she does not want to "mask" the pain however she was in agreement to receive fluids  On reevaluation I updated patient on findings; she then proceeded to request her CBG be checked. Glucose was 98 on BMP. CBG checked at pt's request despite no history of diabetes - 83. I discussed with her that her symptoms are likely related to a migraine and that the headache cocktail will help. She ultimately agreed for the medications however seemed hesitant.   Nursing staff informed that prior to starting the medications pt became tearful and upset which only grew worse after medications were administered. On reevaluation pt reports feeling "jittery" and not right. She may be having a reaction to the compazine and therefore it was added to her allergy list; has  already received benadryl in addition to the compazine. I had offered her ativan to help  calm her as I do suspect her anxiety is also playing a part however she declined. Will plan to recheck after fluids.   Pt reports no longer feeling jittery. Will plan to discharge home at this time. I have recommend PCP follow up for her symptoms. Do not feel pt requires further emergent work up at this time.   This note was prepared using Dragon voice recognition software and may include unintentional dictation errors due to the inherent limitations of voice recognition software.  Final Clinical Impression(s) / ED Diagnoses Final diagnoses:  Bad headache  Hypokalemia    Rx / DC Orders ED Discharge Orders         Ordered    potassium chloride (KLOR-CON) 10 MEQ tablet  Daily        03/31/20 1709           Discharge Instructions     Your labwork and imaging were reassuring today. Your potassium was mildly decreased - please pick up oral potassium supplements and take as prescribed. You will need to have your potassium level rechecked in 1-2 weeks.   Please follow up with your PCP regarding your ED visit today.   Return to the ED for any worsening symptoms       Eustaquio Maize, Hershal Coria 03/31/20 1710    Lacretia Leigh, MD 04/01/20 1652

## 2020-04-05 NOTE — Telephone Encounter (Signed)
Have pt stop taking the Adderall and f/u in clinic.  Appears she has an appt scheduled later this wk.

## 2020-04-06 NOTE — Telephone Encounter (Signed)
Attempted to call pt for advise, pt voicemail is not set up leave a message

## 2020-04-07 ENCOUNTER — Other Ambulatory Visit: Payer: Self-pay

## 2020-04-07 ENCOUNTER — Telehealth: Payer: 59 | Admitting: Family Medicine

## 2020-04-07 ENCOUNTER — Encounter: Payer: Self-pay | Admitting: Family Medicine

## 2020-04-07 ENCOUNTER — Ambulatory Visit: Payer: 59 | Admitting: Family Medicine

## 2020-04-08 ENCOUNTER — Telehealth (INDEPENDENT_AMBULATORY_CARE_PROVIDER_SITE_OTHER): Payer: 59 | Admitting: Family Medicine

## 2020-04-08 ENCOUNTER — Encounter: Payer: Self-pay | Admitting: Family Medicine

## 2020-04-08 ENCOUNTER — Other Ambulatory Visit: Payer: Self-pay

## 2020-04-08 DIAGNOSIS — R41840 Attention and concentration deficit: Secondary | ICD-10-CM

## 2020-04-08 DIAGNOSIS — G4440 Drug-induced headache, not elsewhere classified, not intractable: Secondary | ICD-10-CM

## 2020-04-08 NOTE — Progress Notes (Signed)
Virtual Visit via Video Note  I connected with Caitlyn Roy on 04/08/20 at  1:30 PM EST by a video enabled telemedicine application 2/2 [id number]-09 pandemic and verified that I am speaking with the correct person using two identifiers.  Location patient: home Location provider:work or home office Persons participating in the virtual visit: patient, provider  I discussed the limitations of evaluation and management by telemedicine and the availability of in person appointments. The patient expressed understanding and agreed to proceed.   HPI: Pt feeling better since ED visit.  Still feeling distracted, but no longer having HA since d/c'ing adderall.     ROS: See pertinent positives and negatives per HPI.  Past Medical History:  Diagnosis Date  . ADHD   . Anemia   . Anginal pain ([place])    being followed by cardiologists  . Anxiety   . Depression   . Dysrhythmia    palpitation   . Fibroid   . H/O cesarean section 2010  . History of blood transfusion   . Nausea    d/t to "issues with gallballader"  . 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 Tubal pregnancy   . Vaginal odor   . Vaginitis     Past Surgical History:  Procedure Laterality Date  . BIOPSY EYE MUSCLE    . CESAREAN SECTION  2010  . CESAREAN SECTION N/A 04/06/2018   Procedure: CESAREAN SECTION bladder irrigation;  Surgeon: [name], MD;  Location: [place];  Service: Obstetrics;  Laterality: N/A;  needs RNFA  . CHOLECYSTECTOMY N/A 08/14/2019   Procedure: LAPAROSCOPIC CHOLECYSTECTOMY;  Surgeon: Greer Pickerel, MD;  Location: [facility];  Service: General;  Laterality: N/A;  . DIAGNOSTIC LAPAROSCOPY WITH REMOVAL OF ECTOPIC PREGNANCY N/A 08/07/2018   Procedure: DIAGNOSTIC LAPAROSCOPY WITH REMOVAL OF ECTOPIC PREGNANCY;  Surgeon: Janyth Pupa, DO;  Location: [place];  Service: Gynecology;  Laterality: N/A;  . LAPAROSCOPIC APPENDECTOMY N/A 10/20/2016    Procedure: APPENDECTOMY LAPAROSCOPIC;  Surgeon: Judeth Horn, MD;  Location: [place];  Service: General;  Laterality: N/A;  . LAPAROSCOPIC SALPINGO OOPHERECTOMY Left 08/07/2018   Procedure: LAPAROSCOPIC SALPINGECTOMY LEFT ;  Surgeon: Janyth Pupa, DO;  Location: [place];  Service: Gynecology;  Laterality: Left;    Family History  Problem Relation Age of Onset  . Hypertension Mother   . Breast cancer Mother   . Heart attack Mother   . Seizures Mother   . Alcohol abuse Father   . Kidney disease Maternal Grandmother   . Diabetes Paternal Grandmother       Current Outpatient Medications:  .  amphetamine-dextroamphetamine (ADDERALL) 15 MG tablet, Take 1 tablet by mouth daily. (Patient not taking: Reported on 03/31/2020), Disp: 30 tablet, Rfl: 0 .  chlorhexidine (PERIDEX) 0.12 % solution, Use as directed 5 mLs in the mouth or throat 2 (two) times daily. , Disp: , Rfl:  .  potassium chloride (KLOR-CON) 10 MEQ tablet, Take 1 tablet (10 mEq total) by mouth daily for 5 days., Disp: 5 tablet, Rfl: 0  EXAM:  VITALS per patient if applicable:  GENERAL: alert, oriented, appears well and in no acute distress  HEENT: atraumatic, conjunctiva clear, no obvious abnormalities on inspection of external nose and ears  NECK: normal movements of the head and neck  LUNGS: on inspection no signs of respiratory distress, breathing rate appears normal, no obvious gross SOB, gasping or wheezing  CV: no obvious cyanosis  MS: moves all visible extremities  without noticeable abnormality  PSYCH/NEURO: pleasant and cooperative, no obvious depression or anxiety, speech and thought processing grossly intact  ASSESSMENT AND PLAN:  Discussed the following assessment and plan:  Drug-induced headache, not elsewhere classified, not intractable -likely 2/2 adderall use -med d/c'd.  HAs resolved -continue headache prevention including limiting caffeine intake, getting plenty of rest, and staying  hydrated.  Concentration deficit -discussed ways to focus on completing tasks -pt to look into formal ADHD testing -also consider symptoms a/w SLE or other autoimmune d/o -continue f/u with Rheumatology  F/u prn  I discussed the assessment and treatment plan with the patient. The patient was provided an opportunity to ask questions and all were answered. The patient agreed with the plan and demonstrated an understanding of the instructions.   The patient was advised to call back or seek an in-person evaluation if the symptoms worsen or if the condition fails to improve as anticipated.   Billie Ruddy, MD

## 2020-04-13 NOTE — Telephone Encounter (Signed)
Patient was seen by PCP.

## 2020-04-21 ENCOUNTER — Encounter: Payer: Self-pay | Admitting: Family Medicine

## 2020-04-23 ENCOUNTER — Ambulatory Visit: Payer: 59 | Admitting: Family Medicine

## 2020-04-24 NOTE — Progress Notes (Signed)
error 

## 2020-04-26 ENCOUNTER — Other Ambulatory Visit: Payer: Self-pay

## 2020-04-26 ENCOUNTER — Telehealth (INDEPENDENT_AMBULATORY_CARE_PROVIDER_SITE_OTHER): Payer: 59 | Admitting: Family Medicine

## 2020-04-26 ENCOUNTER — Encounter: Payer: Self-pay | Admitting: Family Medicine

## 2020-04-26 ENCOUNTER — Telehealth: Payer: Self-pay | Admitting: Family Medicine

## 2020-04-26 DIAGNOSIS — F32A Depression, unspecified: Secondary | ICD-10-CM

## 2020-04-26 DIAGNOSIS — F419 Anxiety disorder, unspecified: Secondary | ICD-10-CM | POA: Diagnosis not present

## 2020-04-26 DIAGNOSIS — R5383 Other fatigue: Secondary | ICD-10-CM

## 2020-04-26 DIAGNOSIS — R768 Other specified abnormal immunological findings in serum: Secondary | ICD-10-CM | POA: Diagnosis not present

## 2020-04-26 DIAGNOSIS — R41840 Attention and concentration deficit: Secondary | ICD-10-CM

## 2020-04-26 NOTE — Telephone Encounter (Signed)
Pt state that Dr Volanda Napoleon advised her to call back with the name of the medication, please advise

## 2020-04-26 NOTE — Progress Notes (Signed)
Virtual Visit via Video Note  I connected with Caitlyn Roy on 04/26/20 at 11:00 AM EST by a video enabled telemedicine application 2/2 [id number] pandemic and verified that I am speaking with the correct person using two identifiers.  Location patient: home Location provider:work or home office Persons participating in the virtual visit: patient, provider  I discussed the limitations of evaluation and management by telemedicine and the availability of in person appointments. The patient expressed understanding and agreed to proceed.   HPI: Patient is a 34 year old female with past medical history significant for for question of SLE 2/2+ ANA, palpitations, h/o orbital pseudotumor, anemia, anxiety who was seen for ongoing concern.  Pt feeling worse.  States she does not have any energy, feels depressed, and having difficulty focusing.  States just wants to be able to get through the day.  Pt having some difficulty at work.  Inquires about restarting Adderall.  Med was d/c'd as pt ended up in the ED headaches.  Has not heard anything back from Rheumatology appt. notes always having a history of positive ANA.  Patient hesitant to start medication for anxiety/depression as in the past she was on the medicine and did not like the way it made her feel.  Patient cannot recall the name of medication.  ROS: See pertinent positives and negatives per HPI.  Past Medical History:  Diagnosis Date  . ADHD   . Anemia   . Anginal pain ([place])    being followed by cardiologists  . Anxiety   . Depression   . Dysrhythmia    palpitation   . Fibroid   . H/O cesarean section 2010  . History of blood transfusion   . Nausea    d/t to "issues with gallballader"  . 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 Tubal pregnancy   . Vaginal odor   . Vaginitis     Past Surgical History:  Procedure Laterality Date  . BIOPSY EYE MUSCLE    . CESAREAN  SECTION  2010  . CESAREAN SECTION N/A 04/06/2018   Procedure: CESAREAN SECTION bladder irrigation;  Surgeon: [name], MD;  Location: [place];  Service: Obstetrics;  Laterality: N/A;  needs RNFA  . CHOLECYSTECTOMY N/A 08/14/2019   Procedure: LAPAROSCOPIC CHOLECYSTECTOMY;  Surgeon: Greer Pickerel, MD;  [facility];  Service: General;  Laterality: N/A;  . DIAGNOSTIC LAPAROSCOPY WITH REMOVAL OF ECTOPIC PREGNANCY N/A 08/07/2018   Procedure: DIAGNOSTIC LAPAROSCOPY WITH REMOVAL OF ECTOPIC PREGNANCY;  Surgeon: Janyth Pupa, DO;  Location: [place];  Service: Gynecology;  Laterality: N/A;  . LAPAROSCOPIC APPENDECTOMY N/A 10/20/2016   Procedure: APPENDECTOMY LAPAROSCOPIC;  Surgeon: Judeth Horn, MD;  Location: [place];  Service: General;  Laterality: N/A;  . LAPAROSCOPIC SALPINGO OOPHERECTOMY Left 08/07/2018   Procedure: LAPAROSCOPIC SALPINGECTOMY LEFT ;  Surgeon: Janyth Pupa, DO;  Location: [place];  Service: Gynecology;  Laterality: Left;    Family History  Problem Relation Age of Onset  . Hypertension Mother   . Breast cancer Mother   . Heart attack Mother   . Seizures Mother   . Alcohol abuse Father   . Kidney disease Maternal Grandmother   . Diabetes Paternal Grandmother      EXAM:  VITALS per patient if applicable: Between 48-27 bpm  GENERAL: alert, oriented, appears well and in no acute distress  HEENT: atraumatic, conjunctiva clear, no obvious abnormalities on inspection of external nose and ears  NECK: normal movements  of the head and neck  LUNGS: on inspection no signs of respiratory distress, breathing rate appears normal, no obvious gross SOB, gasping or wheezing  CV: no obvious cyanosis  MS: moves all visible extremities without noticeable abnormality  PSYCH/NEURO: pleasant and cooperative, no obvious depression or anxiety, speech and thought processing grossly intact  ASSESSMENT AND PLAN:  Discussed the following assessment and plan:  Positive ANA  (antinuclear antibody)  -History of positive nuclear, dense fine speckled ANA and positive C4 on 10/08/2019 --Referral to rheumatology for second opinion - Plan: Ambulatory referral to Rheumatology  Fatigue, unspecified type -Discussed possible causes including autoimmune disorder, depression -Discussed supportive care -Consider treatment for depression symptoms -Will not restart Adderall at this time for concentration issues  Anxiety and depression -Discussed considering medication anxiety/depression -Per chart review patient was on Zoloft and Xanax in the past.  -Patient to reach out to pharmacy for name of previous medication that caused her to feel bad.  We will make further recommendations at that time. -Patient to consider counseling  Follow-up in the next few weeks, sooner if needed   I discussed the assessment and treatment plan with the patient. The patient was provided an opportunity to ask questions and all were answered. The patient agreed with the plan and demonstrated an understanding of the instructions.   The patient was advised to call back or seek an in-person evaluation if the symptoms worsen or if the condition fails to improve as anticipated.   Billie Ruddy, MD

## 2020-04-26 NOTE — Telephone Encounter (Signed)
Pt called in as requested by Dr. Volanda Napoleon to give the office a call back to let her know what is at the pharmacy and the only Rx that is at the pharmacy is zoloft.  Pharm:  [facility] on [address]  Pt stated that she would like to have all medications to go to [facility] on [address] and [address] and to please update her file to reflect that.

## 2020-04-26 NOTE — Telephone Encounter (Signed)
Please advise 

## 2020-04-27 ENCOUNTER — Ambulatory Visit (INDEPENDENT_AMBULATORY_CARE_PROVIDER_SITE_OTHER): Payer: 59 | Admitting: *Deleted

## 2020-04-27 ENCOUNTER — Other Ambulatory Visit: Payer: Self-pay

## 2020-04-27 DIAGNOSIS — E538 Deficiency of other specified B group vitamins: Secondary | ICD-10-CM | POA: Diagnosis not present

## 2020-04-27 MED ORDER — CYANOCOBALAMIN 1000 MCG/ML IJ SOLN
1000.0000 ug | Freq: Once | INTRAMUSCULAR | Status: AC
  Administered 2020-04-27: 09:00:00 1000 ug via INTRAMUSCULAR

## 2020-04-27 NOTE — Progress Notes (Signed)
Per orders of Dr. Jordan, injection of Cyanocobalamin 1000mcg given by Princessa Lesmeister A. Patient tolerated injection well.  

## 2020-05-05 ENCOUNTER — Ambulatory Visit (INDEPENDENT_AMBULATORY_CARE_PROVIDER_SITE_OTHER): Payer: 59 | Admitting: Family Medicine

## 2020-05-05 ENCOUNTER — Encounter: Payer: Self-pay | Admitting: Family Medicine

## 2020-05-05 ENCOUNTER — Other Ambulatory Visit: Payer: Self-pay

## 2020-05-05 VITALS — BP 102/76 | HR 76 | Temp 98.7°F | Wt 155.4 lb

## 2020-05-05 DIAGNOSIS — N644 Mastodynia: Secondary | ICD-10-CM

## 2020-05-05 DIAGNOSIS — F419 Anxiety disorder, unspecified: Secondary | ICD-10-CM | POA: Diagnosis not present

## 2020-05-05 DIAGNOSIS — R768 Other specified abnormal immunological findings in serum: Secondary | ICD-10-CM

## 2020-05-05 DIAGNOSIS — R5383 Other fatigue: Secondary | ICD-10-CM | POA: Diagnosis not present

## 2020-05-05 DIAGNOSIS — F32A Depression, unspecified: Secondary | ICD-10-CM

## 2020-05-05 MED ORDER — SERTRALINE HCL 25 MG PO TABS: ORAL_TABLET | ORAL | 1 refills | Status: DC

## 2020-05-05 NOTE — Progress Notes (Signed)
Subjective:    Patient ID: Caitlyn Roy, female    DOB: 04/23/1986, 34 y.o.   MRN: [id number]  No chief complaint on file.   HPI Patient was seen today for f/u.  Pt notes continued lack of energy, feeling down, anxious, palpitations, eyestrain, and fatigued.  Pt has yet to hear back about a 2nd opinion from Rheumatology.  Pt interested in starting med for anxiety/depression symptoms.  Taking Benadryl nightly for sleep.  Pt states in the past zoloft did not help, but she did not increase the dose of take the med consistently.  Pt unsure if prozac or lexapro caused SI.  Pt is being allowed to make up work for one class.  Pt working with HR at her job in regards to her symptoms.  Pt also notes intermittent pain in R breast x several days.  LMP was last wk.  Pt denies changes in breast tissue or skin of breast, nipple drainage, or inversion.  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 Yes Did it involve sudden or severe rash/hives, skin peeling, or any reaction on the inside of your mouth or nose? No Did you need to seek medical attention at a hospital or doctor's office? No When did it last happen?2020 If all above answers are NO, may proceed with cephalosporin use.    Compazine [Prochlorperazine] Anxiety    Pt anxious and jittery stating she can't get comfortable    ROS General: Denies fever, chills, night sweats,  changes in weight, changes in appetite  + fatigue, insomnia HEENT: Denies headaches, ear pain, changes in vision, rhinorrhea, sore throat CV: Denies CP, SOB, orthopnea  + palpitations Pulm: Denies SOB, cough, wheezing GI: Denies abdominal pain, nausea, vomiting, diarrhea, constipation GU: Denies dysuria, hematuria, frequency, vaginal discharge  +R breast pain Msk: Denies muscle cramps, joint pains Neuro: Denies weakness, numbness, tingling Skin: Denies rashes, bruising Psych: Denies hallucinations  + anxiety, depression     Objective:    Blood pressure 102/76, pulse 76, temperature 98.7 F (37.1 C), temperature source Oral, weight 155 lb 6.4 oz (70.5 kg), SpO2 99 %.  Gen. Pleasant, well-nourished, in no distress, normal affect  HEENT: [place]/AT, face symmetric, conjunctiva clear, no scleral icterus, PERRLA, EOMI, nares patent without drainage Lungs: no accessory muscle use Breast: Patient declined Cardiovascular: RRR, no peripheral edema Musculoskeletal: No deformities, no cyanosis or clubbing, normal tone Neuro:  A&Ox3, CN II-XII intact, normal gait Skin:  Warm, no lesions/ rash   Wt Readings from Last 3 Encounters:  05/05/20 155 lb 6.4 oz (70.5 kg)  03/31/20 160 lb (72.6 kg)  03/30/20 164 lb (74.4 kg)    Lab Results  Component Value Date   WBC 9.0 03/31/2020   HGB 10.7 (L) 03/31/2020   HCT 34.8 (L) 03/31/2020  PLT 342 03/31/2020   GLUCOSE 98 03/31/2020   ALT 15 08/12/2019   AST 17 08/12/2019   NA 141 03/31/2020   K 3.1 (L) 03/31/2020   CL 103 03/31/2020   CREATININE 0.84 03/31/2020   BUN <5 (L) 03/31/2020   CO2 23 03/31/2020   TSH 1.56 09/04/2019    Assessment/Plan:  Anxiety and depression -PHQ-9 score 20 -GAD-7 score 13 -Discussed various options including counseling and medication.  Patient interested in starting medication -Patient took Zoloft in the past without issue will start Zoloft 25 mg, then increase increase to 50 mg as tolerated -Given precautions  for SI/HI -Follow-up in 4-6 weeks, sooner if needed - Plan: sertraline (ZOLOFT) 25 MG tablet  Breast pain, right -Breast exam declined -Discussed possible causes including fibrocystic breast tissue -We will continue to monitor  Fatigue, unspecified type -Continue supportive care -Possibly 2/2 autoimmune d/o.  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 -Referral for second opinion placed patient waiting to be contacted to schedule this appointment  F/u in 4-6 weeks  Abbe Amsterdam, MD

## 2020-05-20 ENCOUNTER — Telehealth (INDEPENDENT_AMBULATORY_CARE_PROVIDER_SITE_OTHER): Payer: 59 | Admitting: Family Medicine

## 2020-05-20 ENCOUNTER — Encounter: Payer: Self-pay | Admitting: Family Medicine

## 2020-05-20 DIAGNOSIS — H539 Unspecified visual disturbance: Secondary | ICD-10-CM | POA: Diagnosis not present

## 2020-05-20 DIAGNOSIS — R200 Anesthesia of skin: Secondary | ICD-10-CM

## 2020-05-20 DIAGNOSIS — E538 Deficiency of other specified B group vitamins: Secondary | ICD-10-CM

## 2020-05-20 DIAGNOSIS — M25571 Pain in right ankle and joints of right foot: Secondary | ICD-10-CM | POA: Diagnosis not present

## 2020-05-20 DIAGNOSIS — M25572 Pain in left ankle and joints of left foot: Secondary | ICD-10-CM

## 2020-05-20 DIAGNOSIS — H05113 Granuloma of bilateral orbits: Secondary | ICD-10-CM

## 2020-05-20 NOTE — Progress Notes (Signed)
Virtual Visit via Telephone Note  I connected with Caitlyn Roy on 05/20/20 at  8:00 AM EST by telephone and verified that I am speaking with the correct person using two identifiers.   I discussed the limitations, risks, security and privacy concerns of performing an evaluation and management service by telephone and the availability of in person appointments. I also discussed with the patient that there may be a patient responsible charge related to this service. The patient expressed understanding and agreed to proceed.  Location patient: home Location provider: work or home office Participants present for the call: patient, provider Patient did not have a visit in the prior 7 days to address this/these issue(s).   History of Present Illness: Pt is a 35 yo female with pmh sig for positive ANA, palpitations, h/o depression, h/o anxiety, decreased concentration, iron def anemia, orbital pseudotumor, goiter who was seen for acute concern.  Pt undergoing workup for positive ANA and possible SLE, seen by Rheumatology.  Pt notes blurry vision starting Sunday night.  Pain in b/l ankles and numbness in fingertips since Monday.  Also with pain in feet when walking.  Pt has been out of work since Monday.    Pt denies HAs, SOB, CP, edema.  Pt endorses palpitations. Seen by Cards in the past for similar symptoms.  Heart monitor was normal.   Pt has a h/o orbital pseudotumor cerebri.  Underwent xrt and [facility] at [facility] several yrs ago.  Pt stopped taking Zoloft after 3 days due to feeling funny.    Observations/Objective: Patient sounds tearful and nervous on the phone. I do not appreciate any SOB. Speech and thought processing are grossly intact. Patient reported vitals:  Assessment and Plan: Given vision changes, h/o pseudotumor and positive ANA will obtain imaging and labs.  Concern for worsening vitamin def, lesion in brain, worsening autoimmune d/o.  Given strict precautions.  Pt  advised to contact Rheumatology for f/u.  Further recommendations based on labs.  Vision changes - Plan: CBC with Differential/Platelet, CMP with eGFR([facility]), MR Brain W Wo Contrast, MR ORBITS W WO CONTRAST  Orbital pseudotumor, bilateral - Plan: MR Brain W Wo Contrast  Acute bilateral ankle pain - Plan: CBC with Differential/Platelet  Numbness - Plan: CBC with Differential/Platelet, Vitamin B12, Folate, CMP with eGFR([facility]), TSH, T4, free, Magnesium  Vitamin B12 deficiency - Plan: CBC with Differential/Platelet, Vitamin B12   Follow Up Instructions: F/u prn  [id number] [id number] [id number] 11-20 [id number] 21-30 I did not refer this patient for an OV in the next 24 hours for this/these issue(s).  I discussed the assessment and treatment plan with the patient. The patient was provided an opportunity to ask questions and all were answered. The patient agreed with the plan and demonstrated an understanding of the instructions.   The patient was advised to call back or seek an in-person evaluation if the symptoms worsen or if the condition fails to improve as anticipated.  I provided 9 minutes of non-face-to-face time during this encounter.   Billie Ruddy, MD

## 2020-05-21 ENCOUNTER — Encounter: Payer: Self-pay | Admitting: Family Medicine

## 2020-05-28 ENCOUNTER — Other Ambulatory Visit: Payer: Self-pay

## 2020-05-28 ENCOUNTER — Ambulatory Visit
Admission: RE | Admit: 2020-05-28 | Discharge: 2020-05-28 | Disposition: A | Discharge status: Home / Self Care | Payer: 59 | Source: Ambulatory Visit | Attending: Family Medicine | Admitting: Family Medicine

## 2020-05-28 DIAGNOSIS — H05113 Granuloma of bilateral orbits: Secondary | ICD-10-CM

## 2020-05-28 DIAGNOSIS — H539 Unspecified visual disturbance: Secondary | ICD-10-CM

## 2020-05-28 MED ORDER — GADOBENATE DIMEGLUMINE 529 MG/ML IV SOLN
15.0000 mL | Freq: Once | INTRAVENOUS | Status: AC | PRN
  Administered 2020-05-28: 15 mL via INTRAVENOUS

## 2020-06-02 ENCOUNTER — Ambulatory Visit (INDEPENDENT_AMBULATORY_CARE_PROVIDER_SITE_OTHER): Payer: 59 | Admitting: Internal Medicine

## 2020-06-02 ENCOUNTER — Encounter: Payer: Self-pay | Admitting: Internal Medicine

## 2020-06-02 ENCOUNTER — Other Ambulatory Visit: Payer: Self-pay

## 2020-06-02 VITALS — BP 112/78 | HR 77 | Ht 63.0 in | Wt 157.6 lb

## 2020-06-02 DIAGNOSIS — M222X1 Patellofemoral disorders, right knee: Secondary | ICD-10-CM | POA: Diagnosis not present

## 2020-06-02 DIAGNOSIS — M222X2 Patellofemoral disorders, left knee: Secondary | ICD-10-CM | POA: Diagnosis not present

## 2020-06-02 DIAGNOSIS — H05119 Granuloma of unspecified orbit: Secondary | ICD-10-CM | POA: Insufficient documentation

## 2020-06-02 DIAGNOSIS — R5383 Other fatigue: Secondary | ICD-10-CM

## 2020-06-02 DIAGNOSIS — M329 Systemic lupus erythematosus, unspecified: Secondary | ICD-10-CM

## 2020-06-02 NOTE — Patient Instructions (Signed)
Patellofemoral Pain Syndrome  Patellofemoral pain syndrome is a condition in which the tissue (cartilage) on the underside of the kneecap (patella) softens or breaks down. This causes pain in the front of the knee. The condition is also called runner's knee or chondromalacia patella. Patellofemoral pain syndrome is most common in young adults who are active in sports. The knee is the largest joint in the body. The patella covers the front of the knee and is attached to muscles above and below the knee. The underside of the patella is covered with a smooth type of cartilage (synovium). The smooth surface helps the patella glide easily when you move your knee. Patellofemoral pain syndrome causes swelling in the joint linings and bone surfaces in the knee. What are the causes? This condition may be caused by:  Overuse of the knee.  Poor alignment of your knee joints.  Weak leg muscles.  A direct hit to your kneecap. What increases the risk? You are more likely to develop this condition if:  You do a lot of activities that can wear down your kneecap. These include: ? Running. ? Squatting. ? Climbing stairs.  You start a new physical activity or exercise program.  You wear shoes that do not fit well.  You do not have good leg strength.  You are overweight. What are the signs or symptoms? The main symptom of this condition is knee pain. This may feel like a dull, aching pain underneath your patella, in the front of your knee. There may be a popping or cracking sound when you move your knee. Pain may get worse with:  Exercise.  Climbing stairs.  Running.  Jumping.  Squatting.  Kneeling.  Sitting for a long time.  Moving or pushing on your patella. How is this diagnosed? This condition may be diagnosed based on:  Your symptoms and medical history. You may be asked about your recent physical activities and which ones cause knee pain.  A physical exam. This may  include: ? Moving your patella back and forth. ? Checking your range of knee motion. ? Having you squat or jump to see if you have pain. ? Checking the strength of your leg muscles.  Imaging tests to confirm the diagnosis. These may include an MRI of your knee. How is this treated? This condition may be treated at home with rest, ice, compression, and elevation (Anadalay Macdonell).  Other treatments may include:  NSAIDs, such as ibuprofen.  Physical therapy to stretch and strengthen your leg muscles.  Shoe inserts (orthotics) to take stress off your knee.  A knee brace or knee support.  Adhesive tapes to the skin.  Surgery to remove damaged cartilage or move the patella to a better position. This is rare. Follow these instructions at home: If you have a brace:  Wear the brace as told by your health care provider. Remove it only as told by your health care provider.  Loosen the brace if your toes tingle, become numb, or turn cold and blue.  Keep the brace clean.  If the brace is not waterproof: ? Do not let it get wet. ? Cover it with a watertight covering when you take a bath or a shower. Managing pain, stiffness, and swelling  If directed, put ice on the painful area. To do this: ? If you have a removable brace, remove it as told by your health care provider. ? Put ice in a plastic bag. ? Place a towel between your skin and the bag. ?   Leave the ice on for 20 minutes, 2-3 times a day. ? Remove the ice if your skin turns bright red. This is very important. If you cannot feel pain, heat, or cold, you have a greater risk of damage to the area.  Move your toes often to reduce stiffness and swelling.  Raise (elevate) the injured area above the level of your heart while you are sitting or lying down.   Activity  Rest your knee.  Avoid activities that cause knee pain.  Perform stretching and strengthening exercises as told by your health care provider or physical therapist.  Return  to your normal activities as told by your health care provider. Ask your health care provider what activities are safe for you. General instructions  Take over-the-counter and prescription medicines only as told by your health care provider.  Use splints, braces, knee supports, or walking aids as directed by your health care provider.  Do not use any products that contain nicotine or tobacco, such as cigarettes, e-cigarettes, and chewing tobacco. These can delay healing. If you need help quitting, ask your health care provider.  Keep all follow-up visits. This is important. Contact a health care provider if:  Your symptoms get worse.  You are not improving with home care. Summary  Patellofemoral pain syndrome is a condition in which the tissue (cartilage) on the underside of the kneecap (patella) softens or breaks down.  This condition causes swelling in the joint linings and bone surfaces in the knee. This leads to pain in the front of the knee.  This condition may be treated at home with rest, ice, compression, and elevation (Skylyn Slezak).  Use splints, braces, knee supports, or walking aids as directed by your health care provider.  

## 2020-06-02 NOTE — Progress Notes (Signed)
Office Visit Note  Patient: Caitlyn Roy             Date of Birth: [birth year]           MRN: [id number]             PCP: Billie Ruddy, MD Referring: Billie Ruddy, MD Visit Date: 06/02/2020   Subjective:  New Patient (Initial Visit) and Joint Pain (Patient complains of low back, neck, and bilateral knee pain. )   History of Present Illness: Caitlyn Roy is a 35 y.o. female here for evaluation of positive ANA with chronic fatigue, previously seen by [facility] for evaluation for SLE. She has a history of chronic fatigue dating back to young adulthood that is persistent. This does not typically change in response to sleep and does not typically take naps. She has a history of goiter but no thyroid dysfunction. She previously took stimulant medication for concentration deficit. She also has joint pain affecting the back, knees, and ankles that is more recent and mostly problematic during the past year. Workup for this demonstrated a positive AND and elevated ESR and mild anemia. She was seen by [facility] Dr. Dossie Der who did not see evidence for systemic lupus at that time and did not recommend any treatment for this. Because of persistent symptoms and no clear alternative cause of lab results identified, she is here for another assessment of current findings and symptoms. Besides the above problems she also notices dry eyes and mouth, episode of numbness in her fingers, and episode of axillary lymphadenopathy she thinks was related to a cough and respiratory symptoms. There is some hair thinning but mostly noticed while wearing a tightly woven hairstyle. She has 2 children and had several early term miscarriages before that but no major pregnancy complications. She denies raynaud's phenomenon, pleurisy, skin rashes, eye inflammation, or history of blood clots or abnormal bleeding. She is adopted but from limited knowledge of maternal history no specific known autoimmune  disease.  Labs reviewed 10/2019 ANA 1:320 Dense fine speckled ESR 33 CRP wnl  CBC - Hgb 11.2  Activities of Daily Living:  Patient reports morning stiffness for 24 hours.   Patient Reports nocturnal pain.  Difficulty dressing/grooming: Denies Difficulty climbing stairs: Denies Difficulty getting out of chair: Denies Difficulty using hands for taps, buttons, cutlery, and/or writing: Denies  Review of Systems  Constitutional: Positive for fatigue.  HENT: Positive for mouth dryness. Negative for mouth sores and nose dryness.   Eyes: Positive for itching, visual disturbance and dryness. Negative for pain.  Respiratory: Positive for shortness of breath. Negative for cough, hemoptysis and difficulty breathing.   Cardiovascular: Positive for palpitations. Negative for chest pain and swelling in legs/feet.  Gastrointestinal: Negative for abdominal pain, blood in stool, constipation and diarrhea.  Endocrine: Negative for increased urination.  Genitourinary: Negative for painful urination.  Musculoskeletal: Positive for arthralgias, joint pain and morning stiffness. Negative for joint swelling, myalgias, muscle weakness, muscle tenderness and myalgias.  Skin: Negative for color change, rash and redness.  Allergic/Immunologic: Negative for susceptible to infections.  Neurological: Positive for light-headedness and numbness. Negative for dizziness, headaches, memory loss and weakness.  Hematological: Positive for swollen glands.  Psychiatric/Behavioral: Positive for sleep disturbance. Negative for confusion.    PMFS History:  Patient Active Problem List   Diagnosis Date Noted  . Fatigue 06/02/2020  . 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  . 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 Concentration deficit 03/24/2020  .  Systemic lupus erythematosus ([place]) 03/18/2020  . Ectopic pregnancy 08/07/2018  . S/P repeat low transverse C-section 04/06/2018  . H/O: cesarean section 04/06/2018   . Goiter 08/16/2017  . Acute appendicitis 10/20/2016  . Iron deficiency anemia 03/03/2016  . Palpitations 03/03/2016  . BV (bacterial vaginosis) 10/15/2014    Past Medical History:  Diagnosis Date  . ADHD   . Anemia   . Anginal pain ([place])    being followed by cardiologists  . Anxiety   . Depression   . Dysrhythmia    palpitation   . Fibroid   . H/O cesarean section 2010  . History of blood transfusion   . Nausea    d/t to "issues with gallballader"  . 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 Tubal pregnancy   . Vaginal odor   . Vaginitis     Family History  Problem Relation Age of Onset  . Hypertension Mother   . Breast cancer Mother   . Heart attack Mother   . Seizures Mother   . Alcohol abuse Father   . Kidney disease Maternal Grandmother   . Diabetes Paternal Grandmother    Past Surgical History:  Procedure Laterality Date  . BIOPSY EYE MUSCLE    . CESAREAN SECTION  2010  . CESAREAN SECTION N/A 04/06/2018   Procedure: CESAREAN SECTION bladder irrigation;  Surgeon: [name], MD;  Location: [place];  Service: Obstetrics;  Laterality: N/A;  needs RNFA  . CHOLECYSTECTOMY N/A 08/14/2019   Procedure: LAPAROSCOPIC CHOLECYSTECTOMY;  Surgeon: Greer Pickerel, MD;  Location: [facility];  Service: General;  Laterality: N/A;  . DIAGNOSTIC LAPAROSCOPY WITH REMOVAL OF ECTOPIC PREGNANCY N/A 08/07/2018   Procedure: DIAGNOSTIC LAPAROSCOPY WITH REMOVAL OF ECTOPIC PREGNANCY;  Surgeon: Janyth Pupa, DO;  Location: [place];  Service: Gynecology;  Laterality: N/A;  . LAPAROSCOPIC APPENDECTOMY N/A 10/20/2016   Procedure: APPENDECTOMY LAPAROSCOPIC;  Surgeon: Judeth Horn, MD;  Location: [place];  Service: General;  Laterality: N/A;  . LAPAROSCOPIC SALPINGO OOPHERECTOMY Left 08/07/2018   Procedure: LAPAROSCOPIC SALPINGECTOMY LEFT ;  Surgeon: Janyth Pupa, DO;  Location: [place];  Service: Gynecology;  Laterality: Left;    Social History   Social History Narrative   Lives at home with son   Drinks no caffeine    There is no immunization history for the selected administration types on file for this patient.   Objective: Vital Signs: BP 112/78 (BP Location: Right Arm, Patient Position: Sitting, Cuff Size: Normal)   Pulse 77   Ht 5' 3"  (1.6 m)   Wt 157 lb 9.6 oz (71.5 kg)   LMP 05/23/2020 (Within Days)   BMI 27.92 kg/m    Physical Exam HENT:     Right Ear: External ear normal.     Left Ear: External ear normal.     Mouth/Throat:     Mouth: Mucous membranes are moist.     Pharynx: Oropharynx is clear.  Eyes:     Conjunctiva/sclera: Conjunctivae normal.  Neck:     Comments: Right side palpable mobile nodule mildly tender Cardiovascular:     Rate and Rhythm: Normal rate and regular rhythm.  Pulmonary:     Effort: Pulmonary effort is normal.     Breath sounds: Normal breath sounds.  Skin:    General: Skin is warm and dry.  Neurological:     General: No focal deficit present.     Mental Status: She is alert.  Psychiatric:  Mood and Affect: Mood normal.      Musculoskeletal Exam:  Neck full range of motion, posterior paraspinal tenderness Shoulder, elbow, wrist, fingers full range of motion no swelling Lumbar paraspinal tenderness to palpation Normal hip internal and external rotation without pain Knees no swelling and normal ROM, indicates tenderness at inferior border of patellae negative patella grind test no crepitus Ankles, MTPs no swelling   Investigation: No additional findings.  Imaging: MR Brain W Wo Contrast  Result Date: 05/28/2020 CLINICAL DATA:  Vision changes. Orbital pseudotumor, bilateral. Diplopia; history of orbital pseudotumor status post XRT and steroids, positive ANA with un diagnosed autoimmune disorder and sudden vision change. EXAM: MRI HEAD AND ORBITS WITHOUT AND WITH CONTRAST TECHNIQUE: Multiplanar, multiecho pulse sequences of the brain and  surrounding structures were obtained without and with intravenous contrast. Multiplanar, multiecho pulse sequences of the orbits and surrounding structures were obtained including fat saturation techniques, before and after intravenous contrast administration. CONTRAST:  71m MULTIHANCE GADOBENATE DIMEGLUMINE 529 MG/ML IV SOLN COMPARISON:  MRI of the brain and orbits 04/02/2016. MRI orbits 04/03/2014. FINDINGS: MRI HEAD FINDINGS Brain: Cerebral volume is normal. Two unchanged nonspecific punctate foci of T2/FLAIR hyperintensity within the bilateral subcortical frontal lobe white matter (series 7, image 12) (series 7, image 14). No new focal parenchymal signal abnormality is identified. There is no acute infarct. No evidence of intracranial mass. No chronic intracranial blood products. No extra-axial fluid collection. No midline shift. No abnormal intracranial enhancement. Vascular: Expected proximal arterial flow voids. Skull and upper cervical spine: No focal marrow lesion. MRI ORBITS FINDINGS Orbits: The globes are normal in size and contour. Bilateral lacrimal gland and extraocular muscle enlargement has improved as compared to the prior MRI of the orbits 04/02/2016. The optic nerve sheath complexes are symmetric and unremarkable. Visualized sinuses: Mild paranasal sinus mucosal thickening, most notably ethmoidal. Soft tissues: The visualized maxillofacial and upper neck soft tissues are unremarkable. IMPRESSION: MRI brain: 1. No evidence of acute intracranial abnormality. 2. Stable MRI appearance of the brain as compared to 04/02/2016. 3. Two nonspecific punctate foci of T2/FLAIR hyperintensity within the bilateral frontal lobe subcortical white matter are unchanged. 4. No new parenchymal signal abnormality or abnormal intracranial enhancement. MRI orbits: 1. Bilateral lacrimal gland and extraocular muscle enlargement has improved as compared to the MRI 04/02/2016. Findings may reflect sequela of reported  orbital pseudotumor. However, thyroid-associated orbitopathy is also a consideration. 2. Mild paranasal sinus mucosal thickening. Electronically Signed   By: KKellie SimmeringDO   On: 05/28/2020 11:48   MR ORBITS W WO CONTRAST  Result Date: 05/28/2020 CLINICAL DATA:  Vision changes. Orbital pseudotumor, bilateral. Diplopia; history of orbital pseudotumor status post XRT and steroids, positive ANA with un diagnosed autoimmune disorder and sudden vision change. EXAM: MRI HEAD AND ORBITS WITHOUT AND WITH CONTRAST TECHNIQUE: Multiplanar, multiecho pulse sequences of the brain and surrounding structures were obtained without and with intravenous contrast. Multiplanar, multiecho pulse sequences of the orbits and surrounding structures were obtained including fat saturation techniques, before and after intravenous contrast administration. CONTRAST:  147mMULTIHANCE GADOBENATE DIMEGLUMINE 529 MG/ML IV SOLN COMPARISON:  MRI of the brain and orbits 04/02/2016. MRI orbits 04/03/2014. FINDINGS: MRI HEAD FINDINGS Brain: Cerebral volume is normal. Two unchanged nonspecific punctate foci of T2/FLAIR hyperintensity within the bilateral subcortical frontal lobe white matter (series 7, image 12) (series 7, image 14). No new focal parenchymal signal abnormality is identified. There is no acute infarct. No evidence of intracranial mass. No chronic intracranial blood products. No  extra-axial fluid collection. No midline shift. No abnormal intracranial enhancement. Vascular: Expected proximal arterial flow voids. Skull and upper cervical spine: No focal marrow lesion. MRI ORBITS FINDINGS Orbits: The globes are normal in size and contour. Bilateral lacrimal gland and extraocular muscle enlargement has improved as compared to the prior MRI of the orbits 04/02/2016. The optic nerve sheath complexes are symmetric and unremarkable. Visualized sinuses: Mild paranasal sinus mucosal thickening, most notably ethmoidal. Soft tissues: The  visualized maxillofacial and upper neck soft tissues are unremarkable. IMPRESSION: MRI brain: 1. No evidence of acute intracranial abnormality. 2. Stable MRI appearance of the brain as compared to 04/02/2016. 3. Two nonspecific punctate foci of T2/FLAIR hyperintensity within the bilateral frontal lobe subcortical white matter are unchanged. 4. No new parenchymal signal abnormality or abnormal intracranial enhancement. MRI orbits: 1. Bilateral lacrimal gland and extraocular muscle enlargement has improved as compared to the MRI 04/02/2016. Findings may reflect sequela of reported orbital pseudotumor. However, thyroid-associated orbitopathy is also a consideration. 2. Mild paranasal sinus mucosal thickening. Electronically Signed   By: Kellie Simmering DO   On: 05/28/2020 11:48    Recent Labs: Lab Results  Component Value Date   WBC 9.0 03/31/2020   HGB 10.7 (L) 03/31/2020   PLT 342 03/31/2020   NA 141 03/31/2020   K 3.1 (L) 03/31/2020   CL 103 03/31/2020   CO2 23 03/31/2020   GLUCOSE 98 03/31/2020   BUN <5 (L) 03/31/2020   CREATININE 0.84 03/31/2020   BILITOT 0.6 08/12/2019   ALKPHOS 92 08/12/2019   AST 17 08/12/2019   ALT 15 08/12/2019   PROT 7.9 08/12/2019   ALBUMIN 3.9 08/12/2019   CALCIUM 9.4 03/31/2020   GFRAA >60 08/12/2019    Speciality Comments: No specialty comments available.  Procedures:  No procedures performed Allergies: Penicillins and Compazine [prochlorperazine]   Assessment / Plan:     Visit Diagnoses: Fatigue, unspecified type Systemic lupus erythematosus, unspecified SLE type, unspecified organ involvement status ([place])  Previous lupus evaluation seemed inconclusive and the most severe symptom is currently her fatigue, not otherwise explained by hematologic, endocrine or sleep disorder. I do not see specific evidence for SLE on exam or labs and cannot confirm this diagnosis currently. She reports what sounds like AVISE testing from [facility] which would be useful to review  serology so will request records to review and f/u plan based on findings and if additional tests needed.   Patellofemoral pain syndrome of both knees  Anterior knee pain at patellar tendon insertion worsening with stairs is very typical for PFPS. Provided recommendations for conservative management for this. Patient not interested in imaging for chondromalacia patellae at this time due to no specific treatment needed currently.  Orders: No orders of the defined types were placed in this encounter.  No orders of the defined types were placed in this encounter.   Follow-Up Instructions: No follow-ups on file.   Collier Salina, MD  Note - This record has been created using [employer].  Chart creation errors have been sought, but may not always  have been located. Such creation errors do not reflect on  the standard of medical care.

## 2020-06-03 ENCOUNTER — Telehealth: Payer: Self-pay | Admitting: Radiology

## 2020-06-03 NOTE — Telephone Encounter (Signed)
-----   Message from Collier Salina, MD sent at 06/02/2020 10:04 AM EST ----- Regarding: Request Records Can we request records from [place]? Patient reports having what sounds like AVISE lab testing with Dr. Dossie Der last year.

## 2020-06-03 NOTE — Telephone Encounter (Signed)
LVM for [facility] medical records advising a release of records has been faxed and requesting rheumatology lab results be faxed to our office.

## 2020-06-04 NOTE — Telephone Encounter (Signed)
Attempted to call [facility], the office is closed.   Phone # [phone].

## 2020-06-07 NOTE — Telephone Encounter (Signed)
LVM for Caitlyn Roy at [facility] asking for lab results to be faxed.

## 2020-06-10 ENCOUNTER — Telehealth: Payer: Self-pay | Admitting: Radiology

## 2020-06-10 NOTE — Telephone Encounter (Signed)
Error

## 2020-06-16 ENCOUNTER — Ambulatory Visit (INDEPENDENT_AMBULATORY_CARE_PROVIDER_SITE_OTHER): Payer: 59 | Admitting: Family Medicine

## 2020-06-16 ENCOUNTER — Other Ambulatory Visit: Payer: Self-pay

## 2020-06-16 ENCOUNTER — Encounter: Payer: Self-pay | Admitting: Family Medicine

## 2020-06-16 VITALS — BP 110/78 | HR 85 | Temp 98.6°F | Wt 159.0 lb

## 2020-06-16 DIAGNOSIS — F32A Depression, unspecified: Secondary | ICD-10-CM

## 2020-06-16 DIAGNOSIS — R768 Other specified abnormal immunological findings in serum: Secondary | ICD-10-CM

## 2020-06-16 DIAGNOSIS — E538 Deficiency of other specified B group vitamins: Secondary | ICD-10-CM | POA: Diagnosis not present

## 2020-06-16 DIAGNOSIS — F419 Anxiety disorder, unspecified: Secondary | ICD-10-CM | POA: Diagnosis not present

## 2020-06-16 DIAGNOSIS — R5383 Other fatigue: Secondary | ICD-10-CM | POA: Diagnosis not present

## 2020-06-16 DIAGNOSIS — R7689 Other specified abnormal immunological findings in serum: Secondary | ICD-10-CM

## 2020-06-16 MED ORDER — CYANOCOBALAMIN 1000 MCG/ML IJ SOLN
1000.0000 ug | Freq: Once | INTRAMUSCULAR | Status: AC
  Administered 2020-06-16: 1000 ug via INTRAMUSCULAR

## 2020-06-16 NOTE — Progress Notes (Signed)
Subjective:    Patient ID: Caitlyn Roy, female    DOB: 04-Nov-1985, 35 y.o.   MRN: [id number]  No chief complaint on file.   HPI Patient is a 35 year old female with pmh sig for anxiety, depression, HA, positive ANA, vitamin B12 deficiency, h/o vit D def, h/o orbital pseudotumor, and fatigue who was seen today for f/u on chronic issues.  Pt still with no energy, but feeling somewhat better than previous visits.  No longer having numbness.  Having less blurred vision since using eye gtts regularly.  Had negative MRI of brain and orbits on 05/28/2020 after concern for vision changes.  Patient notes mild increase in depression symptoms.  Tried to restart Zoloft but stopped after 3 days as a medicine caused her to feel sleepy and have no energy.  Pt saw Dr. Benjamine Mola, Rheumatology for a second opinion regarding positive ANA and possible Autoimmune d/o.    Pt due for B12 injection.  Patient starting a new job with [employer].  Feels somewhat less stressed since making the transition.  Pt doing well in school with her online courses at [employer].  Past Medical History:  Diagnosis Date  . ADHD   . Anemia   . Anginal pain ([place])    being followed by cardiologists  . Anxiety   . Depression   . Dysrhythmia    palpitation   . Fibroid   . H/O cesarean section 2010  . History of blood transfusion   . Nausea    d/t to "issues with gallballader"  . 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 Tubal pregnancy   . Vaginal odor   . Vaginitis     Allergies  Allergen Reactions  . Penicillins Hives and Shortness Of Breath    Did it involve swelling of the face/tongue/throat, SOB, or low BP? Yes Did it involve sudden or severe rash/hives, skin peeling, or any reaction on the inside of your mouth or nose? No Did you need to seek medical attention at a hospital or doctor's office? No When did it last happen?2020 If  all above answers are "NO", may proceed with cephalosporin use.   . Compazine [Prochlorperazine] Anxiety    Pt anxious and jittery stating she can't get comfortable    ROS General: Denies fever, chills, night sweats, changes in weight, changes in appetite + decreased energy HEENT: Denies headaches, ear pain, changes in vision, rhinorrhea, sore throat CV: Denies CP, palpitations, SOB, orthopnea Pulm: Denies SOB, cough, wheezing GI: Denies abdominal pain, nausea, vomiting, diarrhea, constipation GU: Denies dysuria, hematuria, frequency, vaginal discharge Msk: Denies muscle cramps, joint pains Neuro: Denies weakness, numbness, tingling Skin: Denies rashes, bruising Psych: Denies hallucinations + anxiety, depression    Objective:    Blood pressure 110/78, pulse 85, temperature 98.6 F (37 C), temperature source Oral, weight 159 lb (72.1 kg), last menstrual period 05/23/2020, SpO2 99 %.  Gen. Pleasant, well-nourished, in no distress, normal affect.  Appears less stressed and fatigued than previous visits.   HEENT: [place]/AT, face symmetric, conjunctiva clear, no scleral icterus, PERRLA, EOMI, nares patent without drainage Lungs: no accessory muscle use Cardiovascular: RRR, no peripheral edema Musculoskeletal: No deformities, no cyanosis or clubbing, normal tone Neuro:  A&Ox3, CN II-XII intact, normal gait Skin:  Warm, no lesions/ rash.  Hyperpigmentation underneath eyes.  Wt Readings from Last 3 Encounters:  06/16/20 159 lb (72.1 kg)  06/02/20 157 lb 9.6 oz (71.5 kg)  05/05/20 155 lb 6.4 oz (70.5 kg)    Lab Results  Component Value Date   WBC 9.0 03/31/2020   HGB 10.7 (L) 03/31/2020   HCT 34.8 (L) 03/31/2020   PLT 342 03/31/2020   GLUCOSE 98 03/31/2020   ALT 15 08/12/2019   AST 17 08/12/2019   NA 141 03/31/2020   K 3.1 (L) 03/31/2020   CL 103 03/31/2020   CREATININE 0.84 03/31/2020   BUN <5 (L) 03/31/2020   CO2 23 03/31/2020   TSH 1.56 09/04/2019     Assessment/Plan:  Fatigue, unspecified type -Improving -Discussed self-care and ways to manage stress including exercising, getting out of the house to get fresh air -Continue follow-up with rheumatology for possible autoimmune d/o -Given precautions  Vitamin B12 deficiency  -Vitamin B12 261 on 09/04/2019 -Discussed repeating vitamin B12 level. Order previously placed. Will need to wait to have this done and received B12 injection this visit. - Plan: cyanocobalamin ((VITAMIN B-12)) injection 1,000 mcg  Positive ANA -Continue follow-up with rheumatology -Per chart review Dr. Benjamine Mola awaiting [facility] notes from Dr. Audelia Hives office -MRI brain and orbit results from 05/28/2020 reviewed with patient.  Depression and anxiety -Improving -PHQ-9 score 17 this visit. Was 20 on 05/06/2020 -GAD-7 score 5 this visit. Was 13 on 05/06/2020 -Will discontinue Zoloft 25 mg. -Continue to monitor -Consider counseling -For continued or worsening symptoms consider a different medication. Would avoid Prozac or Lexapro as per pt one of these caused SI in the past.  F/u prn  Grier Mitts, MD

## 2020-07-02 ENCOUNTER — Telehealth: Payer: Self-pay

## 2020-07-02 ENCOUNTER — Other Ambulatory Visit: Payer: Self-pay

## 2020-07-02 ENCOUNTER — Ambulatory Visit (INDEPENDENT_AMBULATORY_CARE_PROVIDER_SITE_OTHER): Payer: 59 | Admitting: Internal Medicine

## 2020-07-02 VITALS — Wt 160.0 lb

## 2020-07-02 DIAGNOSIS — N939 Abnormal uterine and vaginal bleeding, unspecified: Secondary | ICD-10-CM | POA: Insufficient documentation

## 2020-07-02 DIAGNOSIS — R768 Other specified abnormal immunological findings in serum: Secondary | ICD-10-CM | POA: Insufficient documentation

## 2020-07-02 DIAGNOSIS — M222X2 Patellofemoral disorders, left knee: Secondary | ICD-10-CM

## 2020-07-02 DIAGNOSIS — M222X9 Patellofemoral disorders, unspecified knee: Secondary | ICD-10-CM | POA: Diagnosis not present

## 2020-07-02 DIAGNOSIS — M222X1 Patellofemoral disorders, right knee: Secondary | ICD-10-CM

## 2020-07-02 DIAGNOSIS — D649 Anemia, unspecified: Secondary | ICD-10-CM | POA: Insufficient documentation

## 2020-07-02 MED ORDER — TRIAMCINOLONE ACETONIDE 40 MG/ML IJ SUSP
40.0000 mg | INTRAMUSCULAR | Status: AC | PRN
  Administered 2020-07-02: 40 mg via INTRA_ARTICULAR

## 2020-07-02 MED ORDER — LIDOCAINE HCL 1 % IJ SOLN
3.0000 mL | INTRAMUSCULAR | Status: AC | PRN
  Administered 2020-07-02: 3 mL

## 2020-07-02 NOTE — Patient Instructions (Signed)
Joint Steroid Injection A joint steroid injection is a procedure to relieve swelling and pain in a joint. Steroids are medicines that reduce inflammation. In this procedure, your health care provider uses a syringe and a needle to inject a steroid medicine into a painful and inflamed joint. A pain-relieving medicine (anesthetic) may be injected along with the steroid. In some cases, your health care provider may use an imaging technique such as ultrasound or fluoroscopy to guide the injection. Joints that are often treated with steroid injections include the knee, shoulder, hip, and spine. These injections may also be used in the elbow, ankle, and joints of the hands or feet. You may have joint steroid injections as part of your treatment for inflammation caused by:  Gout.  Rheumatoid arthritis.  Advanced wear-and-tear arthritis (osteoarthritis).  Tendinitis.  Bursitis. Joint steroid injections may be repeated, but having them too often can damage a joint or the skin over the joint. You should not have joint steroid injections less than 6 weeks apart or more than four times a year. Tell a health care provider about:  Any allergies you have.  All medicines you are taking, including vitamins, herbs, eye drops, creams, and over-the-counter medicines.  Any problems you or family members have had with anesthetic medicines.  Any blood disorders you have.  Any surgeries you have had.  Any medical conditions you have.  Whether you are pregnant or may be pregnant. What are the risks? Generally, this is a safe treatment. However, problems may occur, including:  Infection.  Bleeding.  Allergic reactions to medicines.  Damage to the joint or tissues around the joint.  Thinning of skin or loss of skin color over the joint.  Temporary flushing of the face or chest.  Temporary increase in pain.  Temporary increase in blood sugar.  Failure to relieve inflammation or pain. What  happens before the treatment? Medicines Ask your health care provider about:  Changing or stopping your regular medicines. This is especially important if you are taking diabetes medicines or blood thinners.  Taking medicines such as aspirin and ibuprofen. These medicines can thin your blood. Do not take these medicines unless your health care provider tells you to take them.  Taking over-the-counter medicines, vitamins, herbs, and supplements. General instructions  You may have imaging tests of your joint.  Ask your health care provider if you can drive yourself home after the procedure. What happens during the treatment?  Your health care provider will position you for the injection and locate the injection site over your joint.  The skin over the joint will be cleaned with a germ-killing soap.  Your health care provider may: ? Spray a numbing solution (topical anesthetic) over the injection site. ? Inject a local anesthetic under the skin above your joint.  The needle will be placed through your skin into your joint. Your health care provider may use imaging to guide the needle to the right spot for the injection. If imaging is used, a special contrast dye may be injected to confirm that the needle is in the correct location.  The steroid medicine will be injected into your joint.  Anesthetic may be injected along with the steroid. This may be a medicine that relieves pain for a short time (short-acting anesthetic) or for a longer time (long-acting anesthetic).  The needle will be removed, and an adhesive bandage (dressing) will be placed over the injection site. The procedure may vary among health care providers and hospitals.     What can I expect after the treatment?  You will be able to go home after the treatment.  It is normal to feel slight flushing for a few days after the injection.  After the treatment, it is common to have an increase in joint pain after the  anesthetic has worn off. This may happen about an hour after a short-acting anesthetic or about 8 hours after a longer-acting anesthetic.  You should begin to feel relief from joint pain and swelling after 24 to 48 hours. Contact your health care provider if you do not begin to feel relief after 2 days. Follow these instructions at home: Injection site care  Leave the adhesive dressing over your injection site in place until your health care provider says you can remove it.  Check your injection site every day for signs of infection. Check for: ? More redness, swelling, or pain. ? Fluid or blood. ? Warmth. ? Pus or a bad smell. Activity  Return to your normal activities as told by your health care provider. Ask your health care provider what activities are safe for you. You may be asked to limit activities that put stress on the joint for a few days.  Do joint exercises as told by your health care provider.  Do not take baths, swim, or use a hot tub until your health care provider approves. Ask your health care provider if you may take showers. You may only be allowed to take sponge baths. Managing pain, stiffness, and swelling  If directed, put ice on the joint. To do this: ? Put ice in a plastic bag. ? Place a towel between your skin and the bag. ? Leave the ice on for 20 minutes, 2-3 times a day. ? Remove the ice if your skin turns bright red. This is very important. If you cannot feel pain, heat, or cold, you have a greater risk of damage to the area.  Raise (elevate) your joint above the level of your heart when you are sitting or lying down.   General instructions  Take over-the-counter and prescription medicines only as told by your health care provider.  Do not use any products that contain nicotine or tobacco, such as cigarettes, e-cigarettes, and chewing tobacco. These can delay joint healing. If you need help quitting, ask your health care provider.  If you have  diabetes, be aware that your blood sugar may be slightly elevated for several days after the injection.  Keep all follow-up visits. This is important. Contact a health care provider if you have:  Chills or a fever.  Any signs of infection at your injection site.  Increased pain or swelling or no relief after 2 days. Summary  A joint steroid injection is a treatment to relieve pain and swelling in a joint.  Steroids are medicines that reduce inflammation. Your health care provider may add an anesthetic along with the steroid.  You may have joint steroid injections as part of your arthritis treatment.  Joint steroid injections may be repeated, but having them too often can damage a joint or the skin over the joint.  Contact your health care provider if you have a fever, chills, or signs of infection, or if you get no relief from joint pain or swelling. This information is not intended to replace advice given to you by your health care provider. Make sure you discuss any questions you have with your health care provider. Document Revised: 10/03/2019 Document Reviewed: 10/03/2019 Elsevier Patient Education    2021 Elsevier Inc.  

## 2020-07-02 NOTE — Telephone Encounter (Signed)
Per Dr. Benjamine Mola okay to schedule patient. Contacted patient and scheduled her for bilateral knee injection for today 07/02/2020 at 11:15 am.

## 2020-07-02 NOTE — Progress Notes (Signed)
Office Visit Note  Patient: Caitlyn Roy             Date of Birth: May 12, 1985           MRN: [id number]              Subjective:   History of Present Illness: Caitlyn Roy is a 35 y.o. female here for follow up for knee pain for bilateral knee steroid injections for joint pain. Previous antibody testing was reviewed and not specific for SLE so not recommending systemic therapy. She still has fatigue and knee pains no major change since last visit but is interested in trial of intraarticular steroid treatment for this.    PMFS History:  Patient Active Problem List   Diagnosis Date Noted  . Abnormal uterine bleeding 07/02/2020  . Anemia 07/02/2020  . Disorder of patellofemoral joint 07/02/2020  . Other specified abnormal immunological findings in serum 07/02/2020  . Fatigue 06/02/2020  . 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  . 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 Concentration deficit 03/24/2020  . Systemic lupus erythematosus ([place]) 03/18/2020  . Ectopic pregnancy 08/07/2018  . S/P repeat low transverse C-section 04/06/2018  . H/O: cesarean section 04/06/2018  . Goiter 08/16/2017  . Acute appendicitis 10/20/2016  . Iron deficiency anemia 03/03/2016  . Palpitations 03/03/2016  . BV (bacterial vaginosis) 10/15/2014    Past Medical History:  Diagnosis Date  . ADHD   . Anemia   . Anginal pain ([place])    being followed by cardiologists  . Anxiety   . Depression   . Dysrhythmia    palpitation   . Fibroid   . H/O cesarean section 2010  . History of blood transfusion   . Nausea    d/t to "issues with gallballader"  . 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 Tubal pregnancy   . Vaginal odor   . Vaginitis     Family History  Problem Relation Age of Onset  . Hypertension Mother   . Breast cancer Mother   . Heart attack Mother   . Seizures Mother   . Alcohol abuse Father   . Kidney  disease Maternal Grandmother   . Diabetes Paternal Grandmother    Past Surgical History:  Procedure Laterality Date  . BIOPSY EYE MUSCLE    . CESAREAN SECTION  2010  . CESAREAN SECTION N/A 04/06/2018   Procedure: CESAREAN SECTION bladder irrigation;  Surgeon: [name], MD;  Location: [facility];  Service: Obstetrics;  Laterality: N/A;  needs RNFA  . CHOLECYSTECTOMY N/A 08/14/2019   Procedure: LAPAROSCOPIC CHOLECYSTECTOMY;  Surgeon: Greer Pickerel, MD;  Location: [facility];  Service: General;  Laterality: N/A;  . DIAGNOSTIC LAPAROSCOPY WITH REMOVAL OF ECTOPIC PREGNANCY N/A 08/07/2018   Procedure: DIAGNOSTIC LAPAROSCOPY WITH REMOVAL OF ECTOPIC PREGNANCY;  Surgeon: Janyth Pupa, DO;  Location: [facility];  Service: Gynecology;  Laterality: N/A;  . LAPAROSCOPIC APPENDECTOMY N/A 10/20/2016   Procedure: APPENDECTOMY LAPAROSCOPIC;  Surgeon: Judeth Horn, MD;  Location: [facility];  Service: General;  Laterality: N/A;  . LAPAROSCOPIC SALPINGO OOPHERECTOMY Left 08/07/2018   Procedure: LAPAROSCOPIC SALPINGECTOMY LEFT ;  Surgeon: Janyth Pupa, DO;  Location: [facility];  Service: Gynecology;  Laterality: Left;   Social History   Social History Narrative   Lives at home with son   Drinks no caffeine    Immunization History  Administered Date(s) Administered  . Influenza Split 03/09/2015  .  Tdap 01/30/2018     Objective: Vital Signs: Wt 160 lb (72.6 kg)   BMI 28.34 kg/m     Musculoskeletal Exam:  Bilateral knee mild patellofemoral crepitus, no effusions, no laxity, full ROM   Procedures:  Large Joint Inj: bilateral knee on 07/02/2020 12:03 PM Indications: pain Details: 25 G 1.5 in needle, anteromedial approach Medications (Right): 3 mL lidocaine 1 %; 40 mg triamcinolone acetonide 40 MG/ML Medications (Left): 3 mL lidocaine 1 %; 40 mg triamcinolone acetonide 40 MG/ML Outcome: tolerated well, no immediate complications Procedure, treatment alternatives, risks and benefits explained, specific  risks discussed. Consent was given by the patient. Immediately prior to procedure a time out was called to verify the correct patient, procedure, equipment, support staff and site/side marked as required. Patient was prepped and draped in the usual sterile fashion.     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 She had a fairly high amount of anxiety surrounding procedure with needles but tolerated well without incident.  Collier Salina, MD

## 2020-07-02 NOTE — Telephone Encounter (Signed)
Patient called stating at her last appointment with Dr. Benjamine Mola they discussed having knee injections.  Patient states at the appointment she declined, but has decided she would like to get the cortisone injections in both knees.  Patient states her insurance is changing "very soon" and would like to come in today, 07/02/20 if possible.  Please advise.

## 2020-08-07 ENCOUNTER — Encounter (HOSPITAL_COMMUNITY): Payer: Self-pay

## 2020-08-07 ENCOUNTER — Emergency Department (HOSPITAL_COMMUNITY)
Admission: EM | Admit: 2020-08-07 | Discharge: 2020-08-08 | Disposition: A | Discharge status: Home / Self Care | Payer: Medicaid Other | Attending: Emergency Medicine | Admitting: Emergency Medicine

## 2020-08-07 ENCOUNTER — Other Ambulatory Visit: Payer: Self-pay

## 2020-08-07 DIAGNOSIS — R300 Dysuria: Secondary | ICD-10-CM | POA: Insufficient documentation

## 2020-08-07 DIAGNOSIS — R1030 Lower abdominal pain, unspecified: Secondary | ICD-10-CM | POA: Diagnosis not present

## 2020-08-07 DIAGNOSIS — R110 Nausea: Secondary | ICD-10-CM | POA: Diagnosis not present

## 2020-08-07 DIAGNOSIS — N898 Other specified noninflammatory disorders of vagina: Secondary | ICD-10-CM | POA: Diagnosis present

## 2020-08-07 DIAGNOSIS — B9689 Other specified bacterial agents as the cause of diseases classified elsewhere: Secondary | ICD-10-CM | POA: Diagnosis not present

## 2020-08-07 DIAGNOSIS — N760 Acute vaginitis: Secondary | ICD-10-CM | POA: Diagnosis not present

## 2020-08-07 LAB — URINALYSIS, ROUTINE W REFLEX MICROSCOPIC
Bacteria, UA: NONE SEEN
Bilirubin Urine: NEGATIVE
Glucose, UA: NEGATIVE mg/dL
Ketones, ur: NEGATIVE mg/dL
Leukocytes,Ua: NEGATIVE
Nitrite: NEGATIVE
Protein, ur: NEGATIVE mg/dL
Specific Gravity, Urine: 1.013 (ref 1.005–1.030)
pH: 5 (ref 5.0–8.0)

## 2020-08-07 LAB — COMPREHENSIVE METABOLIC PANEL
ALT: 21 U/L (ref 0–44)
AST: 20 U/L (ref 15–41)
Albumin: 3.8 g/dL (ref 3.5–5.0)
Alkaline Phosphatase: 98 U/L (ref 38–126)
Anion gap: 6 (ref 5–15)
BUN: 5 mg/dL — ABNORMAL LOW (ref 6–20)
CO2: 29 mmol/L (ref 22–32)
Calcium: 9.1 mg/dL (ref 8.9–10.3)
Chloride: 103 mmol/L (ref 98–111)
Creatinine, Ser: 0.84 mg/dL (ref 0.44–1.00)
GFR, Estimated: >60 mL/min (ref >60)
Glucose, Bld: 100 mg/dL — ABNORMAL HIGH (ref 70–99)
Potassium: 4 mmol/L (ref 3.5–5.1)
Sodium: 138 mmol/L (ref 135–145)
Total Bilirubin: 0.3 mg/dL (ref 0.3–1.2)
Total Protein: 7.1 g/dL (ref 6.5–8.1)

## 2020-08-07 LAB — CBC WITH DIFFERENTIAL/PLATELET
Abs Immature Granulocytes: 0.01 10*3/uL (ref 0.00–0.07)
Basophils Absolute: 0.1 10*3/uL (ref 0.0–0.1)
Basophils Relative: 1 %
Eosinophils Absolute: 0.2 10*3/uL (ref 0.0–0.5)
Eosinophils Relative: 2 %
HCT: 33.7 % — ABNORMAL LOW (ref 36.0–46.0)
Hemoglobin: 10.6 g/dL — ABNORMAL LOW (ref 12.0–15.0)
Immature Granulocytes: 0 %
Lymphocytes Relative: 43 %
Lymphs Abs: 3.1 10*3/uL (ref 0.7–4.0)
MCH: 27.4 pg (ref 26.0–34.0)
MCHC: 31.5 g/dL (ref 30.0–36.0)
MCV: 87.1 fL (ref 80.0–100.0)
Monocytes Absolute: 0.4 10*3/uL (ref 0.1–1.0)
Monocytes Relative: 6 %
Neutro Abs: 3.5 10*3/uL (ref 1.7–7.7)
Neutrophils Relative %: 48 %
Platelets: 390 10*3/uL (ref 150–400)
RBC: 3.87 MIL/uL (ref 3.87–5.11)
RDW: 14.8 % (ref 11.5–15.5)
WBC: 7.2 10*3/uL (ref 4.0–10.5)
nRBC: 0 % (ref 0.0–0.2)

## 2020-08-07 LAB — LIPASE, BLOOD: Lipase: 29 U/L (ref 11–51)

## 2020-08-07 LAB — PREGNANCY, URINE: Preg Test, Ur: NEGATIVE

## 2020-08-07 NOTE — ED Triage Notes (Signed)
Left lower abdominal pain and vaginal discharge started today. Pt states "im prone to BV"

## 2020-08-07 NOTE — ED Provider Notes (Signed)
MSE was initiated and I personally evaluated the patient and placed orders (if any) at  10:54 PM on August 07, 2020.  The patient appears stable so that the remainder of the MSE may be completed by another provider.  Patient with urinary discomfort, and mild odor for nearly a week now having some lower abdominal pain as well.  No fever chills.  No new sexual partner, states she recently had STI screening by her PCP and was normal.  States she is not sexually active.  On exam, minimal suprapubic tenderness on palpation.  Patient otherwise well-appearing.   Domenic Moras, PA-C 08/07/20 2255    Arnaldo Natal, MD 08/08/20 1224

## 2020-08-08 LAB — WET PREP, GENITAL
Sperm: NONE SEEN
Trich, Wet Prep: NONE SEEN
Yeast Wet Prep HPF POC: NONE SEEN

## 2020-08-08 MED ORDER — METRONIDAZOLE 500 MG PO TABS: 500.0000 mg | ORAL_TABLET | Freq: Two times a day (BID) | ORAL | 0 refills | Status: DC

## 2020-08-08 NOTE — Discharge Instructions (Addendum)
Take the antibiotics as prescribed.  Do not drink alcohol while on this medication.  Follow-up with her gynecologist.  Return to the ED with new or worsening symptoms.

## 2020-08-08 NOTE — ED Notes (Signed)
Urine culture req. sent to lab, they report they will add on

## 2020-08-08 NOTE — ED Provider Notes (Signed)
[facility] EMERGENCY DEPARTMENT Provider Note   CSN: [id number] Arrival date & time: 08/07/20  2249     History Chief Complaint  Patient presents with  . Abdominal Pain  . Vaginal Discharge    Caitlyn Roy is a 35 y.o. female.  Patient states 1 week history of fishy vaginal odor, urinary frequency, urgency hesitancy.  She denies dysuria denies any blood in the urine.  No fever or chills.  Developed suprapubic abdominal pain today that is coming and going.  Denies any abnormal vaginal bleeding but does have a fishy odor and discharge.  Denies any nausea or vomiting.  Denies any change in bowel habits.  Denies any chest pain or shortness of breath. She is concerned that she has bacterial vaginosis as she has had it multiple times in the past. Has had ectopic pregnancy in the past as well as C-section x2 Also history of appendectomy and cholecystectomy  The history is provided by the patient.  Abdominal Pain Associated symptoms: dysuria, nausea and vaginal discharge   Associated symptoms: no chest pain, no cough, no fever, no hematuria, no shortness of breath and no vomiting   Vaginal Discharge Associated symptoms: abdominal pain, dysuria and nausea   Associated symptoms: no fever and no vomiting        Past Medical History:  Diagnosis Date  . ADHD   . Anemia   . Anginal pain ([place])    being followed by cardiologists  . Anxiety   . Depression   . Dysrhythmia    palpitation   . Fibroid   . H/O cesarean section 2010  . History of blood transfusion   . Nausea    d/t to "issues with gallballader"  . 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 Tubal pregnancy   . Vaginal odor   . Vaginitis     Patient Active Problem List   Diagnosis Date Noted  . Abnormal uterine bleeding 07/02/2020  . Anemia 07/02/2020  . Disorder of patellofemoral joint 07/02/2020  . Other specified abnormal  immunological findings in serum 07/02/2020  . Fatigue 06/02/2020  . 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  . 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 Concentration deficit 03/24/2020  . Ectopic pregnancy 08/07/2018  . S/P repeat low transverse C-section 04/06/2018  . H/O: cesarean section 04/06/2018  . Goiter 08/16/2017  . Acute appendicitis 10/20/2016  . Iron deficiency anemia 03/03/2016  . Palpitations 03/03/2016  . BV (bacterial vaginosis) 10/15/2014    Past Surgical History:  Procedure Laterality Date  . BIOPSY EYE MUSCLE    . CESAREAN SECTION  2010  . CESAREAN SECTION N/A 04/06/2018   Procedure: CESAREAN SECTION bladder irrigation;  Surgeon: [name], MD;  Location: [facility];  Service: Obstetrics;  Laterality: N/A;  needs RNFA  . CHOLECYSTECTOMY N/A 08/14/2019   Procedure: LAPAROSCOPIC CHOLECYSTECTOMY;  Surgeon: Greer Pickerel, MD;  [facility];  Service: General;  Laterality: N/A;  . DIAGNOSTIC LAPAROSCOPY WITH REMOVAL OF ECTOPIC PREGNANCY N/A 08/07/2018   Procedure: DIAGNOSTIC LAPAROSCOPY WITH REMOVAL OF ECTOPIC PREGNANCY;  Surgeon: Janyth Pupa, DO;  Location: [facility];  Service: Gynecology;  Laterality: N/A;  . LAPAROSCOPIC APPENDECTOMY N/A 10/20/2016   Procedure: APPENDECTOMY LAPAROSCOPIC;  Surgeon: Judeth Horn, MD;  Location: [facility];  Service: General;  Laterality: N/A;  . LAPAROSCOPIC SALPINGO OOPHERECTOMY Left 08/07/2018   Procedure: LAPAROSCOPIC SALPINGECTOMY LEFT ;  Surgeon: Janyth Pupa, DO;  Location: [facility];  Service: Gynecology;  Laterality: Left;     OB History    Gravida  9   Para  2   Term  2   Preterm      AB  6   Living  2     SAB  4   IAB  1   Ectopic  1   Multiple  0   Live Births  2           Family History  Problem Relation Age of Onset  . Hypertension Mother   . Breast cancer Mother   . Heart attack Mother   . Seizures Mother   . Alcohol abuse Father   . Kidney disease Maternal Grandmother   .  Diabetes Paternal Grandmother     Social History   Tobacco Use  . Smoking status: Never Smoker  . Smokeless tobacco: Never Used  Vaping Use  . Vaping Use: Never used  Substance Use Topics  . Alcohol use: Yes    Comment: Socially  . Drug use: No    Home Medications Prior to Admission medications   Medication Sig Start Date End Date Taking? Authorizing Provider  amphetamine-dextroamphetamine (ADDERALL) 15 MG tablet Take 1 tablet by mouth daily. 03/24/20   Billie Ruddy, MD  chlorhexidine (PERIDEX) 0.12 % solution Use as directed 5 mLs in the mouth or throat 2 (two) times daily. 03/23/20   [provider]  norelgestromin-ethinyl estradiol Marilu Favre) 150-35 MCG/24HR transdermal patch 1 patch 02/10/19   [provider]  pantoprazole (PROTONIX) 40 MG tablet 1 tablet 06/11/19   [provider]  sertraline (ZOLOFT) 25 MG tablet Take one tab (25 mg) daily x 1 wk.  Can increase to 2 tabs daily (50 mg) after one week. Patient taking differently: Take one tab (25 mg) daily x 1 wk.  Can increase to 2 tabs daily (50 mg) after one week. 05/05/20   Billie Ruddy, MD    Allergies    Penicillins and Compazine [prochlorperazine]  Review of Systems   Review of Systems  Constitutional: Negative for activity change, appetite change and fever.  HENT: Negative for congestion.   Respiratory: Negative for cough, chest tightness and shortness of breath.   Cardiovascular: Negative for chest pain.  Gastrointestinal: Positive for abdominal pain and nausea. Negative for vomiting.  Genitourinary: Positive for dysuria, pelvic pain, urgency and vaginal discharge. Negative for flank pain and hematuria.  Musculoskeletal: Negative for arthralgias, back pain and myalgias.  Skin: Negative for wound.  Neurological: Negative for dizziness, weakness and headaches.   all other systems are negative except as noted in the HPI and PMH.   Physical Exam Updated Vital Signs BP 122/81 (BP  Location: Right Arm)   Pulse 80   Temp 98.4 F (36.9 C) (Oral)   Resp 16   Ht 5\' 3"  (1.6 m)   Wt 73 kg   SpO2 100%   BMI 28.51 kg/m   Physical Exam Vitals and nursing note reviewed.  Constitutional:      General: She is not in acute distress.    Appearance: She is well-developed.     Comments: Well-appearing, no distress.  HENT:     Head: Normocephalic and atraumatic.     Mouth/Throat:     Pharynx: No oropharyngeal exudate.  Eyes:     Conjunctiva/sclera: Conjunctivae normal.     Pupils: Pupils are equal, round, and reactive to light.  Neck:     Comments: No meningismus. Cardiovascular:  Rate and Rhythm: Normal rate and regular rhythm.     Heart sounds: Normal heart sounds. No murmur heard.   Pulmonary:     Effort: Pulmonary effort is normal. No respiratory distress.     Breath sounds: Normal breath sounds.  Abdominal:     Palpations: Abdomen is soft.     Tenderness: There is abdominal tenderness. There is no guarding or rebound.     Comments: Suprapubic abdominal pain, no guarding or rebound.  Genitourinary:    Comments: Chaperone present.  Normal external genitalia.  White discharge in vaginal vault.  No CMT.  No lateralized adnexal pain . Musculoskeletal:        General: No tenderness. Normal range of motion.     Cervical back: Normal range of motion and neck supple.     Comments: No CVA tenderness  Skin:    General: Skin is warm.  Neurological:     Mental Status: She is alert and oriented to person, place, and time.     Cranial Nerves: No cranial nerve deficit.     Motor: No abnormal muscle tone.     Coordination: Coordination normal.     Comments:  5/5 strength throughout. CN 2-12 intact.Equal grip strength.   Psychiatric:        Behavior: Behavior normal.     ED Results / Procedures / Treatments   Labs (all labs ordered are listed, but only abnormal results are displayed) Labs Reviewed  WET PREP, GENITAL - Abnormal; Notable for the following  components:      Result Value   Clue Cells Wet Prep HPF POC PRESENT (*)    WBC, Wet Prep HPF POC MANY (*)    All other components within normal limits  CBC WITH DIFFERENTIAL/PLATELET - Abnormal; Notable for the following components:   Hemoglobin 10.6 (*)    HCT 33.7 (*)    All other components within normal limits  COMPREHENSIVE METABOLIC PANEL - Abnormal; Notable for the following components:   Glucose, Bld 100 (*)    BUN 5 (*)    All other components within normal limits  URINALYSIS, ROUTINE W REFLEX MICROSCOPIC - Abnormal; Notable for the following components:   APPearance HAZY (*)    Hgb urine dipstick MODERATE (*)    All other components within normal limits  URINE CULTURE  LIPASE, BLOOD  PREGNANCY, URINE  GC/CHLAMYDIA PROBE AMP ([place]) NOT AT [facility]    EKG None  Radiology No results found.  Procedures Procedures   Medications Ordered in ED Medications - No data to display  ED Course  I have reviewed the triage vital signs and the nursing notes.  Pertinent labs & imaging results that were available during my care of the patient were reviewed by me and considered in my medical decision making (see chart for details).    MDM Rules/Calculators/A&P                         1 week of urgency, dysuria, vaginal odor and discharge.  Abdomen is soft without peritoneal signs.  hCG is negative.  Urinalysis shows hemoglobin but no bacteria or evidence of infection.  Will send culture.  Labs show stable anemia.  Bacterial vaginosis to be treated as above.  Urine culture pending.  Patient anxious to leave.  She understands her sexual partner should be treated and evaluated as well. She will be called if her urine culture grows something that needs treatment. Return precautions discussed Final  Clinical Impression(s) / ED Diagnoses Final diagnoses:  BV (bacterial vaginosis)    Rx / DC Orders ED Discharge Orders    None       Naviyah Schaffert, Annie Main, MD 08/08/20  [phone]

## 2020-08-09 LAB — GC/CHLAMYDIA PROBE AMP (~~LOC~~) NOT AT ARMC
Chlamydia: NEGATIVE
Comment: NEGATIVE
Comment: NORMAL
Neisseria Gonorrhea: NEGATIVE

## 2020-08-09 LAB — URINE CULTURE

## 2020-08-11 ENCOUNTER — Emergency Department (HOSPITAL_COMMUNITY)
Admission: EM | Admit: 2020-08-11 | Discharge: 2020-08-12 | Disposition: A | Discharge status: Home / Self Care | Payer: Medicaid Other | Attending: Emergency Medicine | Admitting: Emergency Medicine

## 2020-08-11 DIAGNOSIS — R42 Dizziness and giddiness: Secondary | ICD-10-CM | POA: Diagnosis not present

## 2020-08-11 DIAGNOSIS — M549 Dorsalgia, unspecified: Secondary | ICD-10-CM

## 2020-08-11 DIAGNOSIS — M5489 Other dorsalgia: Secondary | ICD-10-CM

## 2020-08-11 DIAGNOSIS — R112 Nausea with vomiting, unspecified: Secondary | ICD-10-CM | POA: Insufficient documentation

## 2020-08-11 DIAGNOSIS — R109 Unspecified abdominal pain: Secondary | ICD-10-CM | POA: Insufficient documentation

## 2020-08-11 DIAGNOSIS — R10A0 Flank pain, unspecified side: Secondary | ICD-10-CM

## 2020-08-11 DIAGNOSIS — M5459 Other low back pain: Secondary | ICD-10-CM | POA: Diagnosis not present

## 2020-08-11 LAB — I-STAT BETA HCG BLOOD, ED (MC, WL, AP ONLY): I-stat hCG, quantitative: <5 m[IU]/mL (ref <5)

## 2020-08-11 LAB — URINALYSIS, ROUTINE W REFLEX MICROSCOPIC
Bacteria, UA: NONE SEEN
Bilirubin Urine: NEGATIVE
Glucose, UA: NEGATIVE mg/dL
Ketones, ur: NEGATIVE mg/dL
Nitrite: NEGATIVE
Protein, ur: NEGATIVE mg/dL
Specific Gravity, Urine: 1.015 (ref 1.005–1.030)
pH: 5 (ref 5.0–8.0)

## 2020-08-11 LAB — COMPREHENSIVE METABOLIC PANEL
ALT: 22 U/L (ref 0–44)
AST: 22 U/L (ref 15–41)
Albumin: 3.9 g/dL (ref 3.5–5.0)
Alkaline Phosphatase: 94 U/L (ref 38–126)
Anion gap: 5 (ref 5–15)
BUN: 8 mg/dL (ref 6–20)
CO2: 27 mmol/L (ref 22–32)
Calcium: 9.1 mg/dL (ref 8.9–10.3)
Chloride: 104 mmol/L (ref 98–111)
Creatinine, Ser: 0.88 mg/dL (ref 0.44–1.00)
GFR, Estimated: >60 mL/min (ref >60)
Glucose, Bld: 106 mg/dL — ABNORMAL HIGH (ref 70–99)
Potassium: 3.9 mmol/L (ref 3.5–5.1)
Sodium: 136 mmol/L (ref 135–145)
Total Bilirubin: 0.4 mg/dL (ref 0.3–1.2)
Total Protein: 7.4 g/dL (ref 6.5–8.1)

## 2020-08-11 LAB — CBC
HCT: 35.1 % — ABNORMAL LOW (ref 36.0–46.0)
Hemoglobin: 11 g/dL — ABNORMAL LOW (ref 12.0–15.0)
MCH: 27 pg (ref 26.0–34.0)
MCHC: 31.3 g/dL (ref 30.0–36.0)
MCV: 86.2 fL (ref 80.0–100.0)
Platelets: 409 10*3/uL — ABNORMAL HIGH (ref 150–400)
RBC: 4.07 MIL/uL (ref 3.87–5.11)
RDW: 14.7 % (ref 11.5–15.5)
WBC: 7.5 10*3/uL (ref 4.0–10.5)
nRBC: 0 % (ref 0.0–0.2)

## 2020-08-11 LAB — LIPASE, BLOOD: Lipase: 30 U/L (ref 11–51)

## 2020-08-11 NOTE — ED Triage Notes (Signed)
Pt reports she is here today due to flank left sided flank pain.Pt reports she was seen here for similar.

## 2020-08-11 NOTE — ED Provider Notes (Signed)
Patient placed in Quick Look pathway, seen and evaluated   Chief Complaint: flank pain and pain with urinatin  HPI:   abdominal pain since being here 4 days ago  ROS: no fever, feels achy  Physical Exam:   Gen: No distress  Neuro: Awake and Alert  Skin: Warm    Focused Exam: Lungs clear heart rrr   Initiation of care has begun. The patient has been counseled on the process, plan, and necessity for staying for the completion/evaluation, and the remainder of the medical screening examination   Sidney Ace 08/11/20 1507    Carmin Muskrat, MD 08/12/20 [phone]

## 2020-08-12 ENCOUNTER — Emergency Department (HOSPITAL_COMMUNITY): Payer: Medicaid Other

## 2020-08-12 ENCOUNTER — Encounter (HOSPITAL_COMMUNITY): Payer: Self-pay | Admitting: Student

## 2020-08-12 MED ORDER — LIDOCAINE 5 % EX PTCH: 1.0000 | MEDICATED_PATCH | Freq: Every day | CUTANEOUS | 0 refills | Status: DC | PRN

## 2020-08-12 MED ORDER — ONDANSETRON 4 MG PO TBDP: 4.0000 mg | ORAL_TABLET | Freq: Three times a day (TID) | ORAL | 0 refills | Status: DC | PRN

## 2020-08-12 MED ORDER — SODIUM CHLORIDE 0.9 % IV BOLUS
1000.0000 mL | Freq: Once | INTRAVENOUS | Status: AC
Start: 2020-08-12 — End: 2020-08-12
  Administered 2020-08-12: 1000 mL via INTRAVENOUS

## 2020-08-12 MED ORDER — ONDANSETRON HCL 4 MG/2ML IJ SOLN
4.0000 mg | Freq: Once | INTRAMUSCULAR | Status: AC
  Administered 2020-08-12: 4 mg via INTRAVENOUS
  Filled 2020-08-12: qty 2

## 2020-08-12 MED ORDER — KETOROLAC TROMETHAMINE 15 MG/ML IJ SOLN
15.0000 mg | Freq: Once | INTRAMUSCULAR | Status: AC
  Administered 2020-08-12: 15 mg via INTRAVENOUS
  Filled 2020-08-12: qty 1

## 2020-08-12 MED ORDER — NAPROXEN 500 MG PO TABS: 500.0000 mg | ORAL_TABLET | Freq: Two times a day (BID) | ORAL | 0 refills | Status: DC | PRN

## 2020-08-12 MED ORDER — METHOCARBAMOL 500 MG PO TABS: 500.0000 mg | ORAL_TABLET | Freq: Three times a day (TID) | ORAL | 0 refills | Status: DC | PRN

## 2020-08-12 NOTE — ED Notes (Addendum)
Pt called out to say "my IV is hurting" - This RN went in to assess pt IV. IV did not appear infiltrated - no swelling, no bruising, etc. This RN took out IV per pt request. IV was in tact upon removal. This RN went to start another IV on the pt. Pt states that she would like this RN to look in her forearm before looking in her upper arm. When starting prior IV pt responded "hell no" to starting an IV in her hand. This RN did so but did not see an appropriate vein. This RN asked pt if she would be okay with vein in the AC/upper arm. Pt responded "if that's what you have to do" - Right before this RN was about to insert needle pt pulled arm away. This RN asked pt if she was okay - Pt stated "no is this one going to hurt like the other" - This RN responded that it should not and nothing appeared wrong with the over IV. Pt responded "I think the other one hurt because of the way you inserted it" - This RN asked pt for clarification. Pt responded "it hurt because of the way you went in like under it" - This RN offered to find someone else to start the IV and pt responded yes.

## 2020-08-12 NOTE — Discharge Instructions (Addendum)
You were seen in the emergency department today for flank/back pain with nausea, vomiting, dizziness, and abnormal urination.  Your work-up was overall reassuring in the emergency department.  Your laboratory studies did not show any significant abnormalities.  There was a little bit of hemoglobin in your urine, please have this rechecked by primary care provider in 1 to 2 weeks.  Your urine did not have any significant bacteria in it, we did send this for a culture, we will call you if this is abnormal.  Your blood work showed that you are mildly anemic, this is similar to prior blood work you have had done.  Your kidney function was normal.  Your CT scan did not show any kidney stones or significant abnormalities.  We are unsure of the exact cause of your discomfort, we would like to try additional medications to try to help with your symptoms:   - Naproxen is a nonsteroidal anti-inflammatory medication that will help with pain and swelling. Be sure to take this medication as prescribed with food, 1 pill every 12 hours,  It should be taken with food, as it can cause stomach upset, and more seriously, stomach bleeding. Do not take other nonsteroidal anti-inflammatory medications with this such as Advil, Motrin, Aleve, Mobic, Goodie Powder, or Motrin.    - Robaxin is the muscle relaxer I have prescribed, this is meant to help with muscle tightness. Be aware that this medication may make you drowsy therefore the first time you take this it should be at a time you are in an environment where you can rest. Do not drive or operate heavy machinery when taking this medication. Do not drink alcohol or take other sedating medications with this medicine such as narcotics or benzodiazepines.   -Lidoderm patch: Apply this patch area with significant pain once per day.  Remove and discard patch within 12 hours.  This is to try to help numb/soothe the muscle.  -Zofran: This is a an antinausea medication, take this  every 8 hours as needed for nausea and vomiting  You make take Tylenol per over the counter dosing with these medications.   We have prescribed you new medication(s) today. Discuss the medications prescribed today with your pharmacist as they can have adverse effects and interactions with your other medicines including over the counter and prescribed medications. Seek medical evaluation if you start to experience new or abnormal symptoms after taking one of these medicines, seek care immediately if you start to experience difficulty breathing, feeling of your throat closing, facial swelling, or rash as these could be indications of a more serious allergic reaction  Please follow-up with primary care within 3 days for reevaluation of your symptoms.  We have provided our [facility] information.  Return to the ER for new or worsening symptoms including but not limited to increased pain, fever, inability to keep fluids down, blood in vomit or stool, passing out, chest pain, trouble breathing, or any other concerns that you may have.

## 2020-08-12 NOTE — ED Notes (Signed)
Patient verbalizes understanding of discharge instructions. Opportunity for questioning and answers were provided. Armband removed by staff, pt discharged from ED ambulatory.   

## 2020-08-12 NOTE — ED Provider Notes (Signed)
[facility] Provider Note   CSN: [id number] Arrival date & time: 08/11/20  1400     History Chief Complaint  Patient presents with  . Flank Pain    [facility] is a 35 y.o. female with a history of anemia, anxiety, depression, fibroid, and prior abdominal surgeries including cholecystectomy, laparoscopic salpingo-oophorectomy with removal of ectopic pregnancy, C-section, & appendectomy who returns to the ER due to left flank pain that she developed over the past few days.  Patient states the pain is in the left flank, radiates more laterally, waxing/waning in severity.  No specific alleviating or aggravating factors.  Said some associated nausea with one episode of emesis.  Has felt dizzy at times as well.  States that she has an abnormal sensation when she urinates.  She was seen in the emergency department for her abnormal sensation with urination 08/07/2020, she was found to have bacterial vaginosis and started on Flagyl which she is taking as prescribed.  She denies fever, syncope, melena, hematochezia, hematemesis, dysuria, or atypical vaginal discharge.  HPI     Past Medical History:  Diagnosis Date  . ADHD   . Anemia   . Anginal pain ([place])    being followed by cardiologists  . Anxiety   . Depression   . Dysrhythmia    palpitation   . Fibroid   . H/O cesarean section 2010  . History of blood transfusion   . Nausea    d/t to "issues with gallballader"  . 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 Tubal pregnancy   . Vaginal odor   . Vaginitis     Patient Active Problem List   Diagnosis Date Noted  . Abnormal uterine bleeding 07/02/2020  . Anemia 07/02/2020  . Disorder of patellofemoral joint 07/02/2020  . Other specified abnormal immunological findings in serum 07/02/2020  . Fatigue 06/02/2020  . 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  .  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 Concentration deficit 03/24/2020  . Ectopic pregnancy 08/07/2018  . S/P repeat low transverse C-section 04/06/2018  . H/O: cesarean section 04/06/2018  . Goiter 08/16/2017  . Acute appendicitis 10/20/2016  . Iron deficiency anemia 03/03/2016  . Palpitations 03/03/2016  . BV (bacterial vaginosis) 10/15/2014    Past Surgical History:  Procedure Laterality Date  . BIOPSY EYE MUSCLE    . CESAREAN SECTION  2010  . CESAREAN SECTION N/A 04/06/2018   Procedure: CESAREAN SECTION bladder irrigation;  Surgeon: [name], MD;  Location: [place];  Service: Obstetrics;  Laterality: N/A;  needs RNFA  . CHOLECYSTECTOMY N/A 08/14/2019   Procedure: LAPAROSCOPIC CHOLECYSTECTOMY;  Surgeon: Greer Pickerel, MD;  Location: [facility];  Service: General;  Laterality: N/A;  . DIAGNOSTIC LAPAROSCOPY WITH REMOVAL OF ECTOPIC PREGNANCY N/A 08/07/2018   Procedure: DIAGNOSTIC LAPAROSCOPY WITH REMOVAL OF ECTOPIC PREGNANCY;  Surgeon: Janyth Pupa, DO;  Location: [place];  Service: Gynecology;  Laterality: N/A;  . LAPAROSCOPIC APPENDECTOMY N/A 10/20/2016   Procedure: APPENDECTOMY LAPAROSCOPIC;  Surgeon: Judeth Horn, MD;  Location: [place];  Service: General;  Laterality: N/A;  . LAPAROSCOPIC SALPINGO OOPHERECTOMY Left 08/07/2018   Procedure: LAPAROSCOPIC SALPINGECTOMY LEFT ;  Surgeon: Janyth Pupa, DO;  Location: [place];  Service: Gynecology;  Laterality: Left;     OB History    Gravida  9   Para  2   Term  2   Preterm  AB  6   Living  2     SAB  4   IAB  1   Ectopic  1   Multiple  0   Live Births  2           Family History  Problem Relation Age of Onset  . Hypertension Mother   . Breast cancer Mother   . Heart attack Mother   . Seizures Mother   . Alcohol abuse Father   . Kidney disease Maternal Grandmother   . Diabetes Paternal Grandmother     Social History   Tobacco Use  . Smoking status: Never Smoker  . Smokeless tobacco: Never Used   Vaping Use  . Vaping Use: Never used  Substance Use Topics  . Alcohol use: Yes    Comment: Socially  . Drug use: No    Home Medications Prior to Admission medications   Medication Sig Start Date End Date Taking? Authorizing Provider  metroNIDAZOLE (FLAGYL) 500 MG tablet Take 1 tablet (500 mg total) by mouth 2 (two) times daily. 08/08/20   Rancour, Annie Main, MD  sertraline (ZOLOFT) 25 MG tablet Take one tab (25 mg) daily x 1 wk.  Can increase to 2 tabs daily (50 mg) after one week. Patient not taking: Reported on 08/08/2020 05/05/20   Billie Ruddy, MD    Allergies    Penicillins and Compazine [prochlorperazine]  Review of Systems   Review of Systems  Constitutional: Negative for chills and fever.  Respiratory: Negative for shortness of breath.   Cardiovascular: Negative for chest pain.  Gastrointestinal: Positive for nausea and vomiting. Negative for blood in stool, constipation and diarrhea.  Genitourinary: Negative for dysuria.       Positive for abnormal sensation with urination.  Neurological: Negative for syncope.  All other systems reviewed and are negative.   Physical Exam Updated Vital Signs BP 121/76   Pulse 65   Temp 98.4 F (36.9 C) (Oral)   Resp 17   LMP 07/19/2020   SpO2 100%   Physical Exam Vitals and nursing note reviewed.  Constitutional:      General: She is not in acute distress.    Appearance: She is well-developed. She is not toxic-appearing.  HENT:     Head: Normocephalic and atraumatic.  Eyes:     General:        Right eye: No discharge.        Left eye: No discharge.     Conjunctiva/sclera: Conjunctivae normal.  Cardiovascular:     Rate and Rhythm: Normal rate and regular rhythm.  Pulmonary:     Effort: Pulmonary effort is normal. No respiratory distress.     Breath sounds: Normal breath sounds. No wheezing, rhonchi or rales.  Abdominal:     General: There is no distension.     Palpations: Abdomen is soft.     Tenderness: There is no  abdominal tenderness. There is left CVA tenderness. There is no right CVA tenderness, guarding or rebound.  Musculoskeletal:     Cervical back: Neck supple.     Comments: Back: No midline tenderness palpation.  Left lower thoracic paraspinal muscle tenderness to palpation.  Skin:    General: Skin is warm and dry.     Findings: No rash.  Neurological:     Mental Status: She is alert.     Comments: Clear speech.  Sensation & strength grossly intact to bilateral lower extremities  Psychiatric:        Behavior: Behavior  normal.     ED Results / Procedures / Treatments   Labs (all labs ordered are listed, but only abnormal results are displayed) Labs Reviewed  COMPREHENSIVE METABOLIC PANEL - Abnormal; Notable for the following components:      Result Value   Glucose, Bld 106 (*)    All other components within normal limits  CBC - Abnormal; Notable for the following components:   Hemoglobin 11.0 (*)    HCT 35.1 (*)    Platelets 409 (*)    All other components within normal limits  URINALYSIS, ROUTINE W REFLEX MICROSCOPIC - Abnormal; Notable for the following components:   APPearance HAZY (*)    Hgb urine dipstick SMALL (*)    Leukocytes,Ua TRACE (*)    All other components within normal limits  URINE CULTURE  LIPASE, BLOOD  I-STAT BETA HCG BLOOD, ED ([facility], [facility], [facility] ONLY)    EKG None  Radiology CT Renal Stone Study  Result Date: 08/12/2020 CLINICAL DATA:  Flank pain EXAM: CT ABDOMEN AND PELVIS WITHOUT CONTRAST TECHNIQUE: Multidetector CT imaging of the abdomen and pelvis was performed following the standard protocol without IV contrast. COMPARISON:  None. FINDINGS: Lower chest: Lung bases are clear. No effusions. Heart is normal size. Hepatobiliary: No focal liver abnormality is seen. Status post cholecystectomy. No biliary dilatation. Pancreas: No focal abnormality or ductal dilatation. Spleen: No focal abnormality.  Normal size. Adrenals/Urinary Tract: No adrenal abnormality. No  focal renal abnormality. No stones or hydronephrosis. Urinary bladder is unremarkable. Stomach/Bowel: Stomach, large and small bowel grossly unremarkable. Prior appendectomy. Vascular/Lymphatic: No evidence of aneurysm or adenopathy. Reproductive: Uterus and adnexa unremarkable.  No mass. Other: Trace free fluid in the pelvis.  No free air. Musculoskeletal: No acute bony abnormality. IMPRESSION: No renal or ureteral stones.  No hydronephrosis. No acute findings. Electronically Signed   By: Rolm Baptise M.D.   On: 08/12/2020 01:31    Procedures Procedures   Medications Ordered in ED Medications  sodium chloride 0.9 % bolus 1,000 mL (1,000 mLs Intravenous New Bag/Given 08/12/20 0207)  ondansetron (ZOFRAN) injection 4 mg (4 mg Intravenous Given 08/12/20 0207)  ketorolac (TORADOL) 15 MG/ML injection 15 mg (15 mg Intravenous Given 08/12/20 0209)    ED Course  I have reviewed the triage vital signs and the nursing notes.  Pertinent labs & imaging results that were available during my care of the patient were reviewed by me and considered in my medical decision making (see chart for details).    MDM Rules/Calculators/A&P                         Patient presents to the ED with complaints of left flank pain.  Patient is nontoxic, resting comfortably, vitals are within normal limits.  Additional history obtained:  Additional history obtained from chart review & nursing note review.   Lab Tests:  I reviewed and interpreted labs, which included:  CBC: Anemia similar to prior. CMP: Unremarkable.  Normal renal function. Lipase: Within normal limits Urinalysis: Trace leukocytes and small hemoglobin present, no bacteria, not particularly consistent with overt UTI.  Given abnormal sensation with urination will send for culture.  Imaging Studies ordered:  I ordered imaging studies which included CT renal stone study, I independently reviewed, formal radiology impression shows:  No renal or ureteral  stones.  No hydronephrosis. No acute findings  ED Course:  Overall reassuring work-up in the emergency department. Abdomen nontender w/o peritoneal signs. L CVA and paraspinal back  muscle tenderness. No neuro deficits. Urinalysis without gross infection to indicate pyelonephritis.  CT renal stone study without findings of renal or ureteral stones or other acute process.  Labs appear fairly baseline for the patient.  Unclear definitive process, possibly component of muscular, will trial supportive care.  Urine culture was sent.  PCP follow-up. I discussed results, treatment plan, need for follow-up, and return precautions with the patient. Provided opportunity for questions, patient confirmed understanding and is in agreement with plan.   Portions of this note were generated with [employer]. Dictation errors may occur despite best attempts at proofreading.  Final Clinical Impression(s) / ED Diagnoses Final diagnoses:  Flank pain  Other acute back pain    Rx / DC Orders ED Discharge Orders         Ordered    naproxen (NAPROSYN) 500 MG tablet  2 times daily PRN        08/12/20 0323    methocarbamol (ROBAXIN) 500 MG tablet  Every 8 hours PRN        08/12/20 0323    lidocaine (LIDODERM) 5 %  Daily PRN        08/12/20 0323    ondansetron (ZOFRAN ODT) 4 MG disintegrating tablet  Every 8 hours PRN        08/12/20 0323           Amaryllis Dyke, PA-C 08/12/20 0335    Tegeler, Gwenyth Allegra, MD 08/12/20 [phone]

## 2020-08-12 NOTE — ED Notes (Signed)
Lab to add on Urine culture

## 2020-08-13 LAB — URINE CULTURE: Culture: <10000 — AB

## 2020-08-18 ENCOUNTER — Other Ambulatory Visit: Payer: Self-pay

## 2020-08-18 ENCOUNTER — Encounter: Payer: Self-pay | Admitting: Family Medicine

## 2020-08-18 ENCOUNTER — Ambulatory Visit (INDEPENDENT_AMBULATORY_CARE_PROVIDER_SITE_OTHER): Payer: Self-pay | Admitting: Family Medicine

## 2020-08-18 VITALS — BP 116/70 | HR 92 | Temp 98.7°F | Wt 153.8 lb

## 2020-08-18 DIAGNOSIS — I951 Orthostatic hypotension: Secondary | ICD-10-CM

## 2020-08-18 DIAGNOSIS — R42 Dizziness and giddiness: Secondary | ICD-10-CM

## 2020-08-18 DIAGNOSIS — R3129 Other microscopic hematuria: Secondary | ICD-10-CM

## 2020-08-18 DIAGNOSIS — E538 Deficiency of other specified B group vitamins: Secondary | ICD-10-CM

## 2020-08-18 DIAGNOSIS — R5383 Other fatigue: Secondary | ICD-10-CM

## 2020-08-18 DIAGNOSIS — B9689 Other specified bacterial agents as the cause of diseases classified elsewhere: Secondary | ICD-10-CM

## 2020-08-18 DIAGNOSIS — D509 Iron deficiency anemia, unspecified: Secondary | ICD-10-CM

## 2020-08-18 DIAGNOSIS — N760 Acute vaginitis: Secondary | ICD-10-CM

## 2020-08-18 LAB — POCT URINALYSIS DIPSTICK
Bilirubin, UA: NEGATIVE
Glucose, UA: NEGATIVE
Ketones, UA: NEGATIVE
Leukocytes, UA: NEGATIVE
Nitrite, UA: NEGATIVE
Protein, UA: NEGATIVE
Spec Grav, UA: 1.01 (ref 1.010–1.025)
Urobilinogen, UA: NEGATIVE E.U./dL — AB
pH, UA: 7 (ref 5.0–8.0)

## 2020-08-18 LAB — URINALYSIS, ROUTINE W REFLEX MICROSCOPIC
Bilirubin Urine: NEGATIVE
Ketones, ur: NEGATIVE
Leukocytes,Ua: NEGATIVE
Nitrite: NEGATIVE
Specific Gravity, Urine: <=1.005 — AB (ref 1.000–1.030)
Total Protein, Urine: NEGATIVE
Urine Glucose: NEGATIVE
Urobilinogen, UA: 0.2 (ref 0.0–1.0)
pH: 7 (ref 5.0–8.0)

## 2020-08-18 MED ORDER — METRONIDAZOLE 0.75 % EX GEL: 1.0000 "application " | Freq: Every day | CUTANEOUS | 0 refills | Status: AC

## 2020-08-18 MED ORDER — FERROUS SULFATE 300 (60 FE) MG/5ML PO SYRP: 300.0000 mg | ORAL_SOLUTION | Freq: Every day | ORAL | 3 refills | Status: DC

## 2020-08-18 MED ORDER — CYANOCOBALAMIN 1000 MCG/ML IJ SOLN
1000.0000 ug | Freq: Once | INTRAMUSCULAR | Status: AC
  Administered 2020-08-18: 1000 ug via INTRAMUSCULAR

## 2020-08-18 NOTE — Progress Notes (Signed)
Subjective:    Patient ID: [facility], female    DOB: August 15, 1985, 35 y.o.   MRN: [id number]  No chief complaint on file.   HPI Patient seen today for f/u.  Pt mentions she was w/o insurance.  Pt seen in ED4/2 and 4/6 for flank pain, treated for BV.  UA abnormal, but UCx with <10,000 CFUs.  Pt d/c'd flagyl after 2 days as she read it could cause dizziness.  Dizziness and weakness continued.  Endorses presyncopal episodes.  H/H in ED 11/35.1.  Pt also with no appetite, forces herself to eat.  May drink 1/2 a bottle to 2 bottles of water per day.    Past Medical History:  Diagnosis Date  . ADHD   . Anemia   . Anginal pain ([place])    being followed by cardiologists  . Anxiety   . Depression   . Dysrhythmia    palpitation   . Fibroid   . H/O cesarean section 2010  . History of blood transfusion   . Nausea    d/t to "issues with gallballader"  . 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 Tubal pregnancy   . Vaginal odor   . Vaginitis     Allergies  Allergen Reactions  . Penicillins Hives and Shortness Of Breath    Did it involve swelling of the face/tongue/throat, SOB, or low BP? Yes Did it involve sudden or severe rash/hives, skin peeling, or any reaction on the inside of your mouth or nose? No Did you need to seek medical attention at a hospital or doctor's office? No When did it last happen?2020 If all above answers are "NO", may proceed with cephalosporin use.   . Compazine [Prochlorperazine] Anxiety    Pt anxious and jittery stating she can't get comfortable    ROS General: Denies fever, chills, night sweats, changes in weight +dizziness, fatigue/weakness, no appetite HEENT: Denies headaches, ear pain, changes in vision, rhinorrhea, sore throat CV: Denies CP, palpitations, SOB, orthopnea Pulm: Denies SOB, cough, wheezing GI: Denies abdominal pain, nausea, vomiting, diarrhea, constipation GU: Denies  dysuria, hematuria, frequency, vaginal discharge Msk: Denies muscle cramps, joint pains Neuro: Denies weakness, numbness, tingling Skin: Denies rashes, bruising Psych: Denies depression, anxiety, hallucinations     Objective:    Blood pressure 116/70, pulse 92, temperature 98.7 F (37.1 C), temperature source Oral, weight 153 lb 12.8 oz (69.8 kg), last menstrual period 07/19/2020, SpO2 98 %.  Orthostatic Vitals for the past 48 hrs (Last 6 readings):  Patient Position Orthostatic BP  08/18/20 1122 Sitting --  08/18/20 1158 Sitting 114/72  08/18/20 1201 Standing 92/90  08/18/20 1202 Supine 122/78  08/18/20 1203 Standing 92/82    Gen. Pleasant, well-nourished, in no distress, normal affect   HEENT: [place]/AT, face symmetric, conjunctiva clear, no scleral icterus, PERRLA, EOMI, nares patent without drainage Lungs: no accessory muscle use Cardiovascular: RRR, no peripheral edema Musculoskeletal: No deformities, no cyanosis or clubbing, normal tone Neuro:  A&Ox3, CN II-XII intact, normal gait Skin:  Warm, no lesions/ rash   Wt Readings from Last 3 Encounters:  08/18/20 153 lb 12.8 oz (69.8 kg)  08/07/20 160 lb 15 oz (73 kg)  07/02/20 160 lb (72.6 kg)    Lab Results  Component Value Date   WBC 7.5 08/11/2020   HGB 11.0 (L) 08/11/2020   HCT 35.1 (L) 08/11/2020   PLT 409 (H) 08/11/2020   GLUCOSE 106 (H) 08/11/2020   ALT  22 08/11/2020   AST 22 08/11/2020   NA 136 08/11/2020   K 3.9 08/11/2020   CL 104 08/11/2020   CREATININE 0.88 08/11/2020   BUN 8 08/11/2020   CO2 27 08/11/2020   TSH 1.56 09/04/2019    Assessment/Plan:  Fatigue, unspecified type -chronic -possibly related to positive ANA and undx'd autoimmune issue.  Also consider UTI- though UCx on 08/12/20 with insignificant growth , dehydration.  - Plan: POCT urinalysis dipstick  Dizziness  -Orthostatics positive - Plan: POCT urinalysis dipstick  BV (bacterial vaginosis)  -asymptomatic -clue cells noted on wet  prep 08/08/20.   -will start metro gel as pt declines completing po course - Plan: metroNIDAZOLE (METROGEL) 0.75 % gel  Iron deficiency anemia, unspecified iron deficiency anemia type -H/H 11/35.1 on 08/12/20  -discussed trying liquid ferrous sulfate due to pt preference - Plan: ferrous sulfate 300 (60 Fe) MG/5ML syrup  Orthostatic hypotension -Positive orthostatics -Discussed increasing p.o. intake of water and fluids -Given handout -Continue to monitor  Other microscopic hematuria  -again noted on POC UA -CT stone study negative 08/12/20 -refer to Urology - Plan: Urinalysis with Reflex Microscopic, Ambulatory referral to Urology  Vitamin B 12 deficiency  -B12 low normal at 261 on 09/04/2019 -B12 injections recommended but not done consistently -Discussed rechecking B12 level. - Plan: cyanocobalamin ((VITAMIN B-12)) injection 1,000 mcg  F/u in 1 month  Grier Mitts, MD

## 2020-08-18 NOTE — Patient Instructions (Addendum)
f Orthostatic Hypotension Blood pressure is a measurement of how strongly, or weakly, your blood is pressing against the walls of your arteries. Orthostatic hypotension is a sudden drop in blood pressure that happens when you quickly change positions, such as when you get up from sitting or lying down. Arteries are blood vessels that carry blood from your heart throughout your body. When blood pressure is too low, you may not get enough blood to your brain or to the rest of your organs. This can cause weakness, light-headedness, rapid heartbeat, and fainting. This can last for just a few seconds or for up to a few minutes. Orthostatic hypotension is usually not a serious problem. However, if it happens frequently or gets worse, it may be a sign of something more serious. What are the causes? This condition may be caused by:  Sudden changes in posture, such as standing up quickly after you have been sitting or lying down.  Blood loss.  Loss of body fluids (dehydration).  Heart problems.  Hormone (endocrine) problems.  Pregnancy.  Severe infection.  Lack of certain nutrients.  Severe allergic reactions (anaphylaxis).  Certain medicines, such as blood pressure medicine or medicines that make the body lose excess fluids (diuretics). Sometimes, this condition can be caused by not taking medicine as directed, such as taking too much of a certain medicine. What increases the risk? The following factors may make you more likely to develop this condition:  Age. Risk increases as you get older.  Conditions that affect the heart or the central nervous system.  Taking certain medicines, such as blood pressure medicine or diuretics.  Being pregnant. What are the signs or symptoms? Symptoms of this condition may include:  Weakness.  Light-headedness.  Dizziness.  Blurred vision.  Fatigue.  Rapid heartbeat.  Fainting, in severe cases. How is this diagnosed? This condition is  diagnosed based on:  Your medical history.  Your symptoms.  Your blood pressure measurement. Your health care provider will check your blood pressure when you are: ? Lying down. ? Sitting. ? Standing. A blood pressure reading is recorded as two numbers, such as "120 over 80" (or 120/80). The first ("top") number is called the systolic pressure. It is a measure of the pressure in your arteries as your heart beats. The second ("bottom") number is called the diastolic pressure. It is a measure of the pressure in your arteries when your heart relaxes between beats. Blood pressure is measured in a unit called mm Hg. Healthy blood pressure for most adults is 120/80. If your blood pressure is below 90/60, you may be diagnosed with hypotension. Other information or tests that may be used to diagnose orthostatic hypotension include:  Your other vital signs, such as your heart rate and temperature.  Blood tests.  Tilt table test. For this test, you will be safely secured to a table that moves you from a lying position to an upright position. Your heart rhythm and blood pressure will be monitored during the test. How is this treated? This condition may be treated by:  Changing your diet. This may involve eating more salt (sodium) or drinking more water.  Taking medicines to raise your blood pressure.  Changing the dosage of certain medicines you are taking that might be lowering your blood pressure.  Wearing compression stockings. These stockings help to prevent blood clots and reduce swelling in your legs. In some cases, you may need to go to the hospital for:  Fluid replacement. This means you  will receive fluids through an IV.  Blood replacement. This means you will receive donated blood through an IV (transfusion).  Treating an infection or heart problems, if this applies.  Monitoring. You may need to be monitored while medicines that you are taking wear off. Follow these instructions  at home: Eating and drinking  Drink enough fluid to keep your urine pale yellow.  Eat a healthy diet, and follow instructions from your health care provider about eating or drinking restrictions. A healthy diet includes: ? Fresh fruits and vegetables. ? Whole grains. ? Lean meats. ? Low-fat dairy products.  Eat extra salt only as directed. Do not add extra salt to your diet unless your health care provider told you to do that.  Eat frequent, small meals.  Avoid standing up suddenly after eating.   Medicines  Take over-the-counter and prescription medicines only as told by your health care provider. ? Follow instructions from your health care provider about changing the dosage of your current medicines, if this applies. ? Do not stop or adjust any of your medicines on your own. General instructions  Wear compression stockings as told by your health care provider.  Get up slowly from lying down or sitting positions. This gives your blood pressure a chance to adjust.  Avoid hot showers and excessive heat as directed by your health care provider.  Return to your normal activities as told by your health care provider. Ask your health care provider what activities are safe for you.  Do not use any products that contain nicotine or tobacco, such as cigarettes, e-cigarettes, and chewing tobacco. If you need help quitting, ask your health care provider.  Keep all follow-up visits as told by your health care provider. This is important.   Contact a health care provider if you:  Vomit.  Have diarrhea.  Have a fever for more than 2-3 days.  Feel more thirsty than usual.  Feel weak and tired. Get help right away if you:  Have chest pain.  Have a fast or irregular heartbeat.  Develop numbness in any part of your body.  Cannot move your arms or your legs.  Have trouble speaking.  Become sweaty or feel light-headed.  Faint.  Feel short of breath.  Have trouble staying  awake.  Feel confused. Summary  Orthostatic hypotension is a sudden drop in blood pressure that happens when you quickly change positions.  Orthostatic hypotension is usually not a serious problem.  It is diagnosed by having your blood pressure taken lying down, sitting, and then standing.  It may be treated by changing your diet or adjusting your medicines. This information is not intended to replace advice given to you by your health care provider. Make sure you discuss any questions you have with your health care provider. Document Revised: 10/18/2017 Document Reviewed: 10/18/2017 Elsevier Patient Education  2021 [place].  Dizziness Dizziness is a common problem. It makes you feel unsteady or light-headed. You may feel like you are about to pass out (faint). Dizziness can lead to getting hurt if you stumble or fall. Dizziness can be caused by many things, including:  Medicines.  Not having enough water in your body (dehydration).  Illness. Follow these instructions at home: Eating and drinking  Drink enough fluid to keep your pee (urine) clear or pale yellow. This helps to keep you from getting dehydrated. Try to drink more clear fluids, such as water.  Do not drink alcohol.  Limit how much caffeine you drink  or eat, if your doctor tells you to do that.  Limit how much salt (sodium) you drink or eat, if your doctor tells you to do that.   Activity  Avoid making quick movements. ? When you stand up from sitting in a chair, steady yourself until you feel okay. ? In the morning, first sit up on the side of the bed. When you feel okay, stand slowly while you hold onto something. Do this until you know that your balance is fine.  If you need to stand in one place for a long time, move your legs often. Tighten and relax the muscles in your legs while you are standing.  Do not drive or use heavy machinery if you feel dizzy.  Avoid bending down if you feel dizzy. Place  items in your home so you can reach them easily without leaning over.   Lifestyle  Do not use any products that contain nicotine or tobacco, such as cigarettes and e-cigarettes. If you need help quitting, ask your doctor.  Try to lower your stress level. You can do this by using methods such as yoga or meditation. Talk with your doctor if you need help. General instructions  Watch your dizziness for any changes.  Take over-the-counter and prescription medicines only as told by your doctor. Talk with your doctor if you think that you are dizzy because of a medicine that you are taking.  Tell a friend or a family member that you are feeling dizzy. If he or she notices any changes in your behavior, have this person call your doctor.  Keep all follow-up visits as told by your doctor. This is important. Contact a doctor if:  Your dizziness does not go away.  Your dizziness or light-headedness gets worse.  You feel sick to your stomach (nauseous).  You have trouble hearing.  You have new symptoms.  You are unsteady on your feet.  You feel like the room is spinning. Get help right away if:  You throw up (vomit) or have watery poop (diarrhea), and you cannot eat or drink anything.  You have trouble: ? Talking. ? Walking. ? Swallowing. ? Using your arms, hands, or legs.  You feel generally weak.  You are not thinking clearly, or you have trouble forming sentences. A friend or family member may notice this.  You have: ? Chest pain. ? Pain in your belly (abdomen). ? Shortness of breath. ? Sweating.  Your vision changes.  You are bleeding.  You have a very bad headache.  You have neck pain or a stiff neck.  You have a fever. These symptoms may be an emergency. Do not wait to see if the symptoms will go away. Get medical help right away. Call your local emergency services (911 in the [place] Do not drive yourself to the hospital. Summary  Dizziness makes you feel  unsteady or light-headed. You may feel like you are about to pass out (faint).  Drink enough fluid to keep your pee (urine) clear or pale yellow. Do not drink alcohol.  Avoid making quick movements if you feel dizzy.  Watch your dizziness for any changes. This information is not intended to replace advice given to you by your health care provider. Make sure you discuss any questions you have with your health care provider. Document Revised: 01/14/2020 Document Reviewed: 05/11/2016 Elsevier Patient Education  [place].  [place] medicine (25th ed., pp. [phone]). [place], [place]: Elsevier.">  Anemia  Anemia is a condition in  which there is not enough red blood cells or hemoglobin in the blood. Hemoglobin is a substance in red blood cells that carries oxygen. When you do not have enough red blood cells or hemoglobin (are anemic), your body cannot get enough oxygen and your organs may not work properly. As a result, you may feel very tired or have other problems. What are the causes? Common causes of anemia include:  Excessive bleeding. Anemia can be caused by excessive bleeding inside or outside the body, including bleeding from the intestines or from heavy menstrual periods in females.  Poor nutrition.  Long-lasting (chronic) kidney, thyroid, and liver disease.  Bone marrow disorders, spleen problems, and blood disorders.  Cancer and treatments for cancer.  HIV (human immunodeficiency virus) and AIDS (acquired immunodeficiency syndrome).  Infections, medicines, and autoimmune disorders that destroy red blood cells. What are the signs or symptoms? Symptoms of this condition include:  Minor weakness.  Dizziness.  Headache, or difficulties concentrating and sleeping.  Heartbeats that feel irregular or faster than normal (palpitations).  Shortness of breath, especially with exercise.  Pale skin, lips, and nails, or cold hands and feet.  Indigestion and  nausea. Symptoms may occur suddenly or develop slowly. If your anemia is mild, you may not have symptoms. How is this diagnosed? This condition is diagnosed based on blood tests, your medical history, and a physical exam. In some cases, a test may be needed in which cells are removed from the soft tissue inside of a bone and looked at under a microscope (bone marrow biopsy). Your health care provider may also check your stool (feces) for blood and may do additional testing to look for the cause of your bleeding. Other tests may include:  Imaging tests, such as a CT scan or MRI.  A procedure to see inside your esophagus and stomach (endoscopy).  A procedure to see inside your colon and rectum (colonoscopy). How is this treated? Treatment for this condition depends on the cause. If you continue to lose a lot of blood, you may need to be treated at a hospital. Treatment may include:  Taking supplements of iron, vitamin U23, or folic acid.  Taking a hormone medicine (erythropoietin) that can help to stimulate red blood cell growth.  Having a blood transfusion. This may be needed if you lose a lot of blood.  Making changes to your diet.  Having surgery to remove your spleen. Follow these instructions at home:  Take over-the-counter and prescription medicines only as told by your health care provider.  Take supplements only as told by your health care provider.  Follow any diet instructions that you were given by your health care provider.  Keep all follow-up visits as told by your health care provider. This is important. Contact a health care provider if:  You develop new bleeding anywhere in the body. Get help right away if:  You are very weak.  You are short of breath.  You have pain in your abdomen or chest.  You are dizzy or feel faint.  You have trouble concentrating.  You have bloody stools, black stools, or tarry stools.  You vomit repeatedly or you vomit up  blood. These symptoms may represent a serious problem that is an emergency. Do not wait to see if the symptoms will go away. Get medical help right away. Call your local emergency services (911 in the [place] Do not drive yourself to the hospital. Summary  Anemia is a condition in which you do not  have enough red blood cells or enough of a substance in your red blood cells that carries oxygen (hemoglobin).  Symptoms may occur suddenly or develop slowly.  If your anemia is mild, you may not have symptoms.  This condition is diagnosed with blood tests, a medical history, and a physical exam. Other tests may be needed.  Treatment for this condition depends on the cause of the anemia. This information is not intended to replace advice given to you by your health care provider. Make sure you discuss any questions you have with your health care provider. Document Revised: 04/01/2019 Document Reviewed: 04/01/2019 Elsevier Patient Education  2021 [place].  Vitamin B12 Deficiency Vitamin B12 deficiency occurs when the body does not have enough vitamin B12, which is an important vitamin. The body needs this vitamin:  To make red blood cells.  To make DNA. This is the genetic material inside cells.  To help the nerves work properly so they can carry messages from the brain to the body. Vitamin B12 deficiency can cause various health problems, such as a low red blood cell count (anemia) or nerve damage. What are the causes? This condition may be caused by:  Not eating enough foods that contain vitamin B12.  Not having enough stomach acid and digestive fluids to properly absorb vitamin B12 from the food that you eat.  Certain digestive system diseases that make it hard to absorb vitamin B12. These diseases include Crohn's disease, chronic pancreatitis, and cystic fibrosis.  A condition in which the body does not make enough of a protein (intrinsic factor), resulting in too few red blood  cells (pernicious anemia).  Having a surgery in which part of the stomach or small intestine is removed.  Taking certain medicines that make it hard for the body to absorb vitamin B12. These medicines include: ? Heartburn medicines (antacids and proton pump inhibitors). ? Certain antibiotic medicines. ? Some medicines that are used to treat diabetes, tuberculosis, gout, or high cholesterol. What increases the risk? The following factors may make you more likely to develop a B12 deficiency:  Being older than age 57.  Eating a vegetarian or vegan diet, especially while you are pregnant.  Eating a poor diet while you are pregnant.  Taking certain medicines.  Having alcoholism. What are the signs or symptoms? In some cases, there are no symptoms of this condition. If the condition leads to anemia or nerve damage, various symptoms can occur, such as:  Weakness.  Fatigue.  Loss of appetite.  Weight loss.  Numbness or tingling in your hands and feet.  Redness and burning of the tongue.  Confusion or memory problems.  Depression.  Sensory problems, such as color blindness, ringing in the ears, or loss of taste.  Diarrhea or constipation.  Trouble walking. If anemia is severe, symptoms can include:  Shortness of breath.  Dizziness.  Rapid heart rate (tachycardia). How is this diagnosed? This condition may be diagnosed with a blood test to measure the level of vitamin B12 in your blood. You may also have other tests, including:  A group of tests that measure certain characteristics of blood cells (complete blood count, CBC).  A blood test to measure intrinsic factor.  A procedure where a thin tube with a camera on the end is used to look into your stomach or intestines (endoscopy). Other tests may be needed to discover the cause of B12 deficiency. How is this treated? Treatment for this condition depends on the cause. This condition  may be treated by:  Changing  your eating and drinking habits, such as: ? Eating more foods that contain vitamin B12. ? Drinking less alcohol or no alcohol.  Getting vitamin B12 injections.  Taking vitamin B12 supplements. Your health care provider will tell you which dosage is best for you. Follow these instructions at home: Eating and drinking  Eat lots of healthy foods that contain vitamin B12, including: ? Meats and poultry. This includes beef, pork, chicken, [place], and organ meats, such as liver. ? Seafood. This includes clams, rainbow trout, salmon, tuna, and haddock. ? Eggs. ? Cereal and dairy products that are fortified. This means that vitamin B12 has been added to the food. Check the label on the package to see if the food is fortified. The items listed above may not be a complete list of recommended foods and beverages. Contact a dietitian for more information.   General instructions  Get any injections that are prescribed by your health care provider.  Take supplements only as told by your health care provider. Follow the directions carefully.  Do not drink alcohol if your health care provider tells you not to. In some cases, you may only be asked to limit alcohol use.  Keep all follow-up visits as told by your health care provider. This is important. Contact a health care provider if:  Your symptoms come back. Get help right away if you:  Develop shortness of breath.  Have a rapid heart rate.  Have chest pain.  Become dizzy or lose consciousness. Summary  Vitamin B12 deficiency occurs when the body does not have enough vitamin B12.  The main causes of vitamin B12 deficiency include dietary deficiency, digestive diseases, pernicious anemia, and having a surgery in which part of the stomach or small intestine is removed.  In some cases, there are no symptoms of this condition. If the condition leads to anemia or nerve damage, various symptoms can occur, such as weakness, shortness of breath,  and numbness.  Treatment may include getting vitamin B12 injections or taking vitamin B12 supplements. Eat lots of healthy foods that contain vitamin B12. This information is not intended to replace advice given to you by your health care provider. Make sure you discuss any questions you have with your health care provider. Document Revised: 10/11/2018 Document Reviewed: 01/01/2018 Elsevier Patient Education  2021 [employer].

## 2020-08-23 ENCOUNTER — Telehealth: Payer: Self-pay

## 2020-08-23 NOTE — Telephone Encounter (Signed)
Arnethia from [facility] called to get verbal orders to have liquid iron Rx changed. Spoke with Dr Volanda Napoleon, she stated it was okay for the change.

## 2020-09-15 ENCOUNTER — Encounter (HOSPITAL_COMMUNITY): Payer: Self-pay | Admitting: Emergency Medicine

## 2020-09-15 ENCOUNTER — Other Ambulatory Visit: Payer: Self-pay

## 2020-09-15 ENCOUNTER — Emergency Department (HOSPITAL_COMMUNITY): Payer: Medicaid Other

## 2020-09-15 ENCOUNTER — Emergency Department (HOSPITAL_COMMUNITY)
Admission: EM | Admit: 2020-09-15 | Discharge: 2020-09-15 | Disposition: A | Discharge status: Home / Self Care | Payer: Medicaid Other | Attending: Emergency Medicine | Admitting: Emergency Medicine

## 2020-09-15 DIAGNOSIS — R0789 Other chest pain: Secondary | ICD-10-CM

## 2020-09-15 DIAGNOSIS — R000 Tachycardia, unspecified: Secondary | ICD-10-CM | POA: Insufficient documentation

## 2020-09-15 DIAGNOSIS — U071 COVID-19: Secondary | ICD-10-CM | POA: Diagnosis not present

## 2020-09-15 DIAGNOSIS — R0602 Shortness of breath: Secondary | ICD-10-CM

## 2020-09-15 LAB — CBC WITH DIFFERENTIAL/PLATELET
Abs Immature Granulocytes: 0.02 10*3/uL (ref 0.00–0.07)
Basophils Absolute: 0 10*3/uL (ref 0.0–0.1)
Basophils Relative: 0 %
Eosinophils Absolute: 0 10*3/uL (ref 0.0–0.5)
Eosinophils Relative: 1 %
HCT: 36 % (ref 36.0–46.0)
Hemoglobin: 11.1 g/dL — ABNORMAL LOW (ref 12.0–15.0)
Immature Granulocytes: 0 %
Lymphocytes Relative: 11 %
Lymphs Abs: 0.7 10*3/uL (ref 0.7–4.0)
MCH: 26.9 pg (ref 26.0–34.0)
MCHC: 30.8 g/dL (ref 30.0–36.0)
MCV: 87.4 fL (ref 80.0–100.0)
Monocytes Absolute: 0.6 10*3/uL (ref 0.1–1.0)
Monocytes Relative: 10 %
Neutro Abs: 4.6 10*3/uL (ref 1.7–7.7)
Neutrophils Relative %: 78 %
Platelets: 331 10*3/uL (ref 150–400)
RBC: 4.12 MIL/uL (ref 3.87–5.11)
RDW: 14.8 % (ref 11.5–15.5)
WBC: 5.9 10*3/uL (ref 4.0–10.5)
nRBC: 0 % (ref 0.0–0.2)

## 2020-09-15 LAB — URINALYSIS, ROUTINE W REFLEX MICROSCOPIC
Bacteria, UA: NONE SEEN
Bilirubin Urine: NEGATIVE
Glucose, UA: NEGATIVE mg/dL
Ketones, ur: NEGATIVE mg/dL
Leukocytes,Ua: NEGATIVE
Nitrite: NEGATIVE
Protein, ur: NEGATIVE mg/dL
Specific Gravity, Urine: 1.008 (ref 1.005–1.030)
pH: 8 (ref 5.0–8.0)

## 2020-09-15 LAB — COMPREHENSIVE METABOLIC PANEL
ALT: 24 U/L (ref 0–44)
AST: 26 U/L (ref 15–41)
Albumin: 3.9 g/dL (ref 3.5–5.0)
Alkaline Phosphatase: 82 U/L (ref 38–126)
Anion gap: 6 (ref 5–15)
BUN: <5 mg/dL — ABNORMAL LOW (ref 6–20)
CO2: 26 mmol/L (ref 22–32)
Calcium: 8.9 mg/dL (ref 8.9–10.3)
Chloride: 104 mmol/L (ref 98–111)
Creatinine, Ser: 0.88 mg/dL (ref 0.44–1.00)
GFR, Estimated: >60 mL/min (ref >60)
Glucose, Bld: 105 mg/dL — ABNORMAL HIGH (ref 70–99)
Potassium: 3.2 mmol/L — ABNORMAL LOW (ref 3.5–5.1)
Sodium: 136 mmol/L (ref 135–145)
Total Bilirubin: <0.1 mg/dL — ABNORMAL LOW (ref 0.3–1.2)
Total Protein: 7.1 g/dL (ref 6.5–8.1)

## 2020-09-15 LAB — TROPONIN I (HIGH SENSITIVITY)
Troponin I (High Sensitivity): <2 ng/L (ref <18)
Troponin I (High Sensitivity): <2 ng/L (ref <18)

## 2020-09-15 LAB — D-DIMER, QUANTITATIVE: D-Dimer, Quant: <0.27 ug/mL-FEU (ref 0.00–0.50)

## 2020-09-15 LAB — RESP PANEL BY RT-PCR (FLU A&B, COVID) ARPGX2
Influenza A by PCR: NEGATIVE
Influenza B by PCR: NEGATIVE
SARS Coronavirus 2 by RT PCR: POSITIVE — AB

## 2020-09-15 LAB — I-STAT BETA HCG BLOOD, ED (MC, WL, AP ONLY): I-stat hCG, quantitative: <5 m[IU]/mL (ref <5)

## 2020-09-15 MED ORDER — ACETAMINOPHEN 500 MG PO TABS
500.0000 mg | ORAL_TABLET | Freq: Once | ORAL | Status: AC
  Administered 2020-09-15: 500 mg via ORAL
  Filled 2020-09-15: qty 1

## 2020-09-15 MED ORDER — IBUPROFEN 400 MG PO TABS
600.0000 mg | ORAL_TABLET | Freq: Once | ORAL | Status: AC
  Administered 2020-09-15: 600 mg via ORAL
  Filled 2020-09-15: qty 1

## 2020-09-15 MED ORDER — SODIUM CHLORIDE 0.9 % IV BOLUS
1000.0000 mL | Freq: Once | INTRAVENOUS | Status: AC
  Administered 2020-09-15: 1000 mL via INTRAVENOUS

## 2020-09-15 MED ORDER — ONDANSETRON HCL 4 MG PO TABS: 4.0000 mg | ORAL_TABLET | Freq: Three times a day (TID) | ORAL | 0 refills | Status: DC | PRN

## 2020-09-15 NOTE — Discharge Instructions (Signed)
Your work-up today confirmed COVID-19 as the cause of your symptoms.  Your blood clot test was negative and your heart enzymes are reassuring.  The rest of the work-up was overall similar to prior and reassuring but she did have the slightly low potassium.  Please rest and stay hydrated and use the nausea medicine to maintain hydration.  Please follow-up with your primary doctor.  If any symptoms change or worsen acutely, please return to the nearest emergency department.

## 2020-09-15 NOTE — ED Triage Notes (Signed)
Pt c/o fever, shortness of breath, nausea and neck pain.

## 2020-09-15 NOTE — ED Provider Notes (Signed)
Emergency Medicine Provider Triage Evaluation Note  Caitlyn Roy 35 y.o. female was evaluated in triage.  Pt complains of fever, cough, shortness of breath, generalized body aches, nausea that began today.  Patient states she was at home measured temperature of 100.8.  States she has felt pain diffusely.  Also reports he is having some cough.  She feels like she is short of breath.  She took Tylenol prior to ED arrival.  No abdominal pain, chest pain.  She has not been vaccinated for COVID.  No COVID exposure that she knows of.   Review of Systems  Positive: Fever, cough, nausea, generalized body aches, shortness of breath. Negative: Abdominal pain, chest pain, vomiting  Physical Exam  BP 134/82   Pulse 70   Temp 98.2 F (36.8 C) (Oral)   Resp 18   Ht 5\' 4"  (1.626 m)   Wt 65.8 kg   SpO2 100%   BMI 24.89 kg/m  Gen:   Awake, no distress   HEENT:  Atraumatic  Resp:  Normal effort.  Lungs clear to auscultation bilaterally.  No wheezing.  Able to speak in full sentences with any difficulty. Cardiac:  Tachycardic Abd:   Nondistended, nontender  MSK:   Moves extremities without difficulty  Neuro:  Speech clear   Other:      Medical Decision Making  Medically screening exam initiated at 4:39 PM  Appropriate orders placed.  NOELI LAVERY was informed that the remainder of the evaluation will be completed by another provider, this initial triage assessment does not replace that evaluation, and the importance of remaining in the ED until their evaluation is complete.   Clinical Impression  Fever ,cough SOB   Portions of this note were generated with Dragon dictation software. Dictation errors may occur despite best attempts at proofreading.     Volanda Napoleon, PA-C 09/15/20 1646    Noemi Chapel, MD 09/15/20 [phone]

## 2020-09-15 NOTE — ED Provider Notes (Signed)
[facility] EMERGENCY DEPARTMENT Provider Note   CSN: [id number] Arrival date & time: 09/15/20  1627     History Chief Complaint  Patient presents with  . Shortness of Breath    Caitlyn Roy is a 35 y.o. female.  The history is provided by the patient and medical records. No language interpreter was used.  Chest Pain Pain location:  Substernal area Pain quality: aching, crushing, pressure and tightness   Pain radiates to:  Does not radiate Pain severity:  Moderate Onset quality:  Gradual Duration:  2 days Timing:  Constant Progression:  Waxing and waning Worsened by:  Coughing Ineffective treatments:  None tried Associated symptoms: cough, fatigue and shortness of breath   Associated symptoms: no abdominal pain, no altered mental status, no back pain, no dizziness, no fever, no headache, no lower extremity edema, no nausea, no numbness, no palpitations, no vomiting and no weakness        Past Medical History:  Diagnosis Date  . ADHD   . Anemia   . Anginal pain ([place])    being followed by cardiologists  . Anxiety   . Depression   . Dysrhythmia    palpitation   . Fibroid   . H/O cesarean section 2010  . History of blood transfusion   . Nausea    d/t to "issues with gallballader"  . 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 Tubal pregnancy   . Vaginal odor   . Vaginitis     Patient Active Problem List   Diagnosis Date Noted  . Abnormal uterine bleeding 07/02/2020  . Anemia 07/02/2020  . Disorder of patellofemoral joint 07/02/2020  . Other specified abnormal immunological findings in serum 07/02/2020  . Fatigue 06/02/2020  . 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  . 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 Concentration deficit 03/24/2020  . Ectopic pregnancy 08/07/2018  . S/P repeat low transverse C-section 04/06/2018  . H/O: cesarean section 04/06/2018  .  Goiter 08/16/2017  . Acute appendicitis 10/20/2016  . Iron deficiency anemia 03/03/2016  . Palpitations 03/03/2016  . BV (bacterial vaginosis) 10/15/2014    Past Surgical History:  Procedure Laterality Date  . BIOPSY EYE MUSCLE    . CESAREAN SECTION  2010  . CESAREAN SECTION N/A 04/06/2018   Procedure: CESAREAN SECTION bladder irrigation;  Surgeon: [name], MD;  Location: [facility];  Service: Obstetrics;  Laterality: N/A;  needs RNFA  . CHOLECYSTECTOMY N/A 08/14/2019   Procedure: LAPAROSCOPIC CHOLECYSTECTOMY;  Surgeon: Greer Pickerel, MD;  Location: [facility];  Service: General;  Laterality: N/A;  . DIAGNOSTIC LAPAROSCOPY WITH REMOVAL OF ECTOPIC PREGNANCY N/A 08/07/2018   Procedure: DIAGNOSTIC LAPAROSCOPY WITH REMOVAL OF ECTOPIC PREGNANCY;  Surgeon: Janyth Pupa, DO;  Location: [facility];  Service: Gynecology;  Laterality: N/A;  . LAPAROSCOPIC APPENDECTOMY N/A 10/20/2016   Procedure: APPENDECTOMY LAPAROSCOPIC;  Surgeon: Judeth Horn, MD;  Location: [facility];  Service: General;  Laterality: N/A;  . LAPAROSCOPIC SALPINGO OOPHERECTOMY Left 08/07/2018   Procedure: LAPAROSCOPIC SALPINGECTOMY LEFT ;  Surgeon: Janyth Pupa, DO;  Location: [facility];  Service: Gynecology;  Laterality: Left;     OB History    Gravida  9   Para  2   Term  2   Preterm      AB  6   Living  2     SAB  4   IAB  1   Ectopic  1  Multiple  0   Live Births  2           Family History  Problem Relation Age of Onset  . Hypertension Mother   . Breast cancer Mother   . Heart attack Mother   . Seizures Mother   . Alcohol abuse Father   . Kidney disease Maternal Grandmother   . Diabetes Paternal Grandmother     Social History   Tobacco Use  . Smoking status: Never Smoker  . Smokeless tobacco: Never Used  Vaping Use  . Vaping Use: Never used  Substance Use Topics  . Alcohol use: Yes    Comment: Socially  . Drug use: No    Home Medications Prior to Admission medications   Medication  Sig Start Date End Date Taking? Authorizing Provider  ferrous sulfate 300 (60 Fe) MG/5ML syrup Take 5 mLs (300 mg total) by mouth daily with breakfast. 08/18/20   Billie Ruddy, MD  lidocaine (LIDODERM) 5 % Place 1 patch onto the skin daily as needed. Apply patch to area most significant pain once per day.  Remove and discard patch within 12 hours of application. Patient not taking: Reported on 08/18/2020 08/12/20   Petrucelli, Glynda Jaeger, PA-C  methocarbamol (ROBAXIN) 500 MG tablet Take 1 tablet (500 mg total) by mouth every 8 (eight) hours as needed for muscle spasms. Patient not taking: Reported on 08/18/2020 08/12/20   Petrucelli, Glynda Jaeger, PA-C  metroNIDAZOLE (FLAGYL) 500 MG tablet Take 1 tablet (500 mg total) by mouth 2 (two) times daily. Patient not taking: Reported on 08/18/2020 08/08/20   Ezequiel Essex, MD  naproxen (NAPROSYN) 500 MG tablet Take 1 tablet (500 mg total) by mouth 2 (two) times daily as needed for moderate pain. Patient not taking: Reported on 08/18/2020 08/12/20   Petrucelli, Samantha R, PA-C  ondansetron (ZOFRAN ODT) 4 MG disintegrating tablet Take 1 tablet (4 mg total) by mouth every 8 (eight) hours as needed for nausea or vomiting. Patient not taking: Reported on 08/18/2020 08/12/20   Petrucelli, Aldona Bar R, PA-C  sertraline (ZOLOFT) 25 MG tablet Take one tab (25 mg) daily x 1 wk.  Can increase to 2 tabs daily (50 mg) after one week. Patient not taking: Reported on 08/08/2020 05/05/20 08/12/20  Billie Ruddy, MD    Allergies    Penicillins and Compazine [prochlorperazine]  Review of Systems   Review of Systems  Constitutional: Positive for chills and fatigue. Negative for fever.  HENT: Positive for congestion.   Eyes: Negative for visual disturbance.  Respiratory: Positive for cough, chest tightness and shortness of breath. Negative for wheezing.   Cardiovascular: Positive for chest pain. Negative for palpitations.  Gastrointestinal: Negative for abdominal pain,  constipation, diarrhea, nausea and vomiting.  Genitourinary: Negative for dysuria, flank pain and frequency.  Musculoskeletal: Negative for back pain, neck pain and neck stiffness.  Skin: Negative for rash and wound.  Neurological: Negative for dizziness, weakness, light-headedness, numbness and headaches.  Psychiatric/Behavioral: Negative for agitation and confusion.  All other systems reviewed and are negative.   Physical Exam Updated Vital Signs BP 130/86 (BP Location: Left Arm)   Pulse (!) 158   Temp (!) 100.8 F (38.2 C)   Resp 18   SpO2 100%   Physical Exam Vitals and nursing note reviewed.  Constitutional:      General: She is not in acute distress.    Appearance: She is well-developed. She is not ill-appearing, toxic-appearing or diaphoretic.  HENT:     Head:  Normocephalic and atraumatic.     Nose: Congestion present.     Mouth/Throat:     Mouth: Mucous membranes are dry.     Pharynx: No oropharyngeal exudate or posterior oropharyngeal erythema.  Eyes:     Conjunctiva/sclera: Conjunctivae normal.     Pupils: Pupils are equal, round, and reactive to light.  Cardiovascular:     Rate and Rhythm: Regular rhythm. Tachycardia present.     Pulses: Normal pulses.     Heart sounds: No murmur heard.   Pulmonary:     Effort: Pulmonary effort is normal. No respiratory distress.     Breath sounds: Normal breath sounds. No wheezing, rhonchi or rales.  Chest:     Chest wall: No tenderness.  Abdominal:     General: Abdomen is flat.     Palpations: Abdomen is soft.     Tenderness: There is no abdominal tenderness. There is no right CVA tenderness, left CVA tenderness, guarding or rebound.  Musculoskeletal:        General: No tenderness.     Cervical back: Neck supple. No tenderness.     Right lower leg: No tenderness. No edema.     Left lower leg: No tenderness. No edema.  Skin:    General: Skin is warm and dry.     Capillary Refill: Capillary refill takes less than 2  seconds.     Coloration: Skin is not pale.     Findings: No erythema.  Neurological:     General: No focal deficit present.     Mental Status: She is alert.     Sensory: No sensory deficit.     Motor: No weakness.  Psychiatric:        Mood and Affect: Mood normal.     ED Results / Procedures / Treatments   Labs (all labs ordered are listed, but only abnormal results are displayed) Labs Reviewed  RESP PANEL BY RT-PCR (FLU A&B, COVID) ARPGX2 - Abnormal; Notable for the following components:      Result Value   SARS Coronavirus 2 by RT PCR POSITIVE (*)    All other components within normal limits  CBC WITH DIFFERENTIAL/PLATELET - Abnormal; Notable for the following components:   Hemoglobin 11.1 (*)    All other components within normal limits  COMPREHENSIVE METABOLIC PANEL - Abnormal; Notable for the following components:   Potassium 3.2 (*)    Glucose, Bld 105 (*)    BUN <5 (*)    Total Bilirubin <0.1 (*)    All other components within normal limits  URINALYSIS, ROUTINE W REFLEX MICROSCOPIC - Abnormal; Notable for the following components:   Color, Urine STRAW (*)    Hgb urine dipstick MODERATE (*)    All other components within normal limits  URINE CULTURE  D-DIMER, QUANTITATIVE  I-STAT BETA HCG BLOOD, ED ([facility], [facility], [facility] ONLY)  TROPONIN I (HIGH SENSITIVITY)  TROPONIN I (HIGH SENSITIVITY)    EKG EKG Interpretation  Date/Time:  Wednesday Sep 15 2020 16:31:50 EDT Ventricular Rate:  149 PR Interval:  112 QRS Duration: 64 QT Interval:  256 QTC Calculation: 403 R Axis:   64 Text Interpretation: Sinus tachycardia T wave abnormality, consider inferior ischemia Abnormal ECG When compared to prior faster rate. No STEMI Confirmed by Antony Blackbird [phone]) on 09/15/2020 4:47:33 PM   Radiology DG Chest Portable 1 View  Result Date: 09/15/2020 CLINICAL DATA:  Chest pain and fever, initial encounter EXAM: PORTABLE CHEST 1 VIEW COMPARISON:  10/28/2018 FINDINGS: The heart size  and  mediastinal contours are within normal limits. Both lungs are clear. The visualized skeletal structures are unremarkable. IMPRESSION: No active disease. Electronically Signed   By: Inez Catalina M.D.   On: 09/15/2020 17:26    Procedures Procedures   Caitlyn Roy was evaluated in Emergency Department on 09/15/2020 for the symptoms described in the history of present illness. She was evaluated in the context of the global COVID-19 pandemic, which necessitated consideration that the patient might be at risk for infection with the SARS-CoV-2 virus that causes COVID-19. Institutional protocols and algorithms that pertain to the evaluation of patients at risk for COVID-19 are in a state of rapid change based on information released by regulatory bodies including the CDC and federal and state organizations. These policies and algorithms were followed during the patient's care in the ED.   Medications Ordered in ED Medications  sodium chloride 0.9 % bolus 1,000 mL (has no administration in time range)  acetaminophen (TYLENOL) tablet 500 mg (has no administration in time range)    ED Course  I have reviewed the triage vital signs and the nursing notes.  Pertinent labs & imaging results that were available during my care of the patient were reviewed by me and considered in my medical decision making (see chart for details).    MDM Rules/Calculators/A&P                          Caitlyn Roy is a 35 y.o. female with a past medical history significant for prior ectopic tubal pregnancy status post removal, prior appendicitis, and chronic anemia who presents with fevers, chills, cough, chest pain, shortness of breath, fatigue, myalgias, sore throat, and tachycardia.  Patient reports that for the last 2 days she has been feeling ill.  She reports no known COVID contacts to her knowledge.  She reports no significant nausea vomiting constipation, diarrhea, or urinary changes but she has been  feeling very warm and feverish.  She reports no trauma and denies significant headaches but reports overall back and neck soreness and fatigue.  She does not report any neurologic complaints and denies any vision changes, speech difficulties, or other focal neurologic complaints.  She reports her cough is nonproductive and she is not having any abdominal pains.  She reports the chest discomfort is more of a pressure and tightness.  On arrival, patient's heart rate is in the 140s and 150s and she is febrile.  She is not tachypneic or hypoxic or hypotensive.  Clinical aspect she has a viral infection such as COVID however, with her chest pain, shortness of breath, and significant tachycardia, we will also work-up for other abnormalities or bacterial infections.  We will get labs, chest x-ray, troponin, and a D-dimer.  EKG does not show STEMI but shows sinus tachycardia.  She will given fluids and Tylenol for the fever and what appears to be dehydration with dry mucous membranes on exam.  Anticipate reassessment for work-up to determine disposition.  9:42 PM Work-up confirmed the patient does have COVID-19 as a cause of her symptoms.  No evidence of pneumonia or abnormality on chest x-ray.  Troponin negative x2 and D-dimer negative.  Doubt PE.  Other labs similar to prior and reassuring.  Patient has mild hypokalemia which we discussed and she will improve this with diet.  She will stay hydrated home.  Due to her nausea, will give prescription for Zofran to have her maintain hydration.  She will  follow PCP.  She understood plan of care and had no other questions or concerns.  Patient discharged in good condition.   Final Clinical Impression(s) / ED Diagnoses Final diagnoses:  SOB (shortness of breath)  Chest tightness  COVID-19    Rx / DC Orders ED Discharge Orders         Ordered    ondansetron (ZOFRAN) 4 MG tablet  Every 8 hours PRN        09/15/20 2143         Clinical Impression: 1.  SOB (shortness of breath)   2. Chest tightness   3. COVID-19     Disposition: Discharge  Condition: Good  I have discussed the results, Dx and Tx plan with the pt(& family if present). He/she/they expressed understanding and agree(s) with the plan. Discharge instructions discussed at great length. Strict return precautions discussed and pt &/or family have verbalized understanding of the instructions. No further questions at time of discharge.    New Prescriptions   ONDANSETRON (ZOFRAN) 4 MG TABLET    Take 1 tablet (4 mg total) by mouth every 8 (eight) hours as needed for nausea or vomiting.    Follow Up: Billie Ruddy, MD [place] [address] [place] [address] [id number] mc [place]       Lindsey Hommel, Gwenyth Allegra, MD 09/15/20 2144

## 2020-09-17 LAB — URINE CULTURE

## 2020-09-18 ENCOUNTER — Encounter (HOSPITAL_COMMUNITY): Payer: Self-pay

## 2020-09-18 ENCOUNTER — Emergency Department (HOSPITAL_COMMUNITY): Payer: Medicaid Other

## 2020-09-18 ENCOUNTER — Other Ambulatory Visit: Payer: Self-pay

## 2020-09-18 ENCOUNTER — Emergency Department (HOSPITAL_COMMUNITY)
Admission: EM | Admit: 2020-09-18 | Discharge: 2020-09-19 | Disposition: A | Discharge status: Home / Self Care | Payer: Medicaid Other | Attending: Emergency Medicine | Admitting: Emergency Medicine

## 2020-09-18 DIAGNOSIS — R000 Tachycardia, unspecified: Secondary | ICD-10-CM | POA: Insufficient documentation

## 2020-09-18 DIAGNOSIS — U071 COVID-19: Secondary | ICD-10-CM | POA: Insufficient documentation

## 2020-09-18 DIAGNOSIS — R0902 Hypoxemia: Secondary | ICD-10-CM

## 2020-09-18 DIAGNOSIS — F606 Avoidant personality disorder: Secondary | ICD-10-CM | POA: Insufficient documentation

## 2020-09-18 DIAGNOSIS — R042 Hemoptysis: Secondary | ICD-10-CM | POA: Diagnosis not present

## 2020-09-18 DIAGNOSIS — R0602 Shortness of breath: Secondary | ICD-10-CM | POA: Diagnosis present

## 2020-09-18 LAB — BASIC METABOLIC PANEL
Anion gap: 8 (ref 5–15)
BUN: <5 mg/dL — ABNORMAL LOW (ref 6–20)
CO2: 25 mmol/L (ref 22–32)
Calcium: 8.5 mg/dL — ABNORMAL LOW (ref 8.9–10.3)
Chloride: 103 mmol/L (ref 98–111)
Creatinine, Ser: 0.93 mg/dL (ref 0.44–1.00)
GFR, Estimated: >60 mL/min (ref >60)
Glucose, Bld: 129 mg/dL — ABNORMAL HIGH (ref 70–99)
Potassium: 3.2 mmol/L — ABNORMAL LOW (ref 3.5–5.1)
Sodium: 136 mmol/L (ref 135–145)

## 2020-09-18 LAB — D-DIMER, QUANTITATIVE: D-Dimer, Quant: 0.84 ug/mL-FEU — ABNORMAL HIGH (ref 0.00–0.50)

## 2020-09-18 LAB — URINALYSIS, ROUTINE W REFLEX MICROSCOPIC
Bilirubin Urine: NEGATIVE
Glucose, UA: NEGATIVE mg/dL
Ketones, ur: 20 mg/dL — AB
Leukocytes,Ua: NEGATIVE
Nitrite: NEGATIVE
Protein, ur: NEGATIVE mg/dL
Specific Gravity, Urine: 1.014 (ref 1.005–1.030)
pH: 5 (ref 5.0–8.0)

## 2020-09-18 LAB — CBC
HCT: 37.8 % (ref 36.0–46.0)
Hemoglobin: 11.8 g/dL — ABNORMAL LOW (ref 12.0–15.0)
MCH: 26.8 pg (ref 26.0–34.0)
MCHC: 31.2 g/dL (ref 30.0–36.0)
MCV: 85.9 fL (ref 80.0–100.0)
Platelets: 266 10*3/uL (ref 150–400)
RBC: 4.4 MIL/uL (ref 3.87–5.11)
RDW: 14.6 % (ref 11.5–15.5)
WBC: 3.9 10*3/uL — ABNORMAL LOW (ref 4.0–10.5)
nRBC: 0 % (ref 0.0–0.2)

## 2020-09-18 LAB — TROPONIN I (HIGH SENSITIVITY)
Troponin I (High Sensitivity): 5 ng/L (ref <18)
Troponin I (High Sensitivity): 4 ng/L (ref <18)

## 2020-09-18 LAB — CBG MONITORING, ED: Glucose-Capillary: 78 mg/dL (ref 70–99)

## 2020-09-18 LAB — I-STAT BETA HCG BLOOD, ED (MC, WL, AP ONLY): I-stat hCG, quantitative: <5 m[IU]/mL (ref <5)

## 2020-09-18 MED ORDER — SODIUM CHLORIDE 0.9 % IV BOLUS
1000.0000 mL | Freq: Once | INTRAVENOUS | Status: AC
  Administered 2020-09-18: 1000 mL via INTRAVENOUS

## 2020-09-18 MED ORDER — IOHEXOL 350 MG/ML SOLN
50.0000 mL | Freq: Once | INTRAVENOUS | Status: AC | PRN
  Administered 2020-09-18: 50 mL via INTRAVENOUS

## 2020-09-18 NOTE — ED Notes (Signed)
MD said pt's o2 dropped to 85% while talking to him. MD put pt on 2l of O2.

## 2020-09-18 NOTE — ED Notes (Signed)
Tried to call CT to take pt. No answer on both numbers

## 2020-09-18 NOTE — ED Provider Notes (Signed)
Caitlyn Roy is a 35 y.o. female, presenting to the ED with shortness of breath in the setting of COVID-19.  HPI from Antony Blackbird, MD: "Caitlyn Roy is a 35 y.o. female.  The history is provided by the patient and medical records. No language interpreter was used.  Shortness of Breath Severity:  Severe Onset quality:  Gradual Duration:  3 days Timing:  Constant Progression:  Worsening Chronicity:  New Context: URI (covid)   Relieved by:  Nothing Worsened by:  Coughing and deep breathing Ineffective treatments:  None tried Associated symptoms: chest pain, cough, fever, hemoptysis, sputum production and vomiting   Associated symptoms: no diaphoresis, no headaches and no wheezing   Risk factors: no hx of PE/DVT "   Physical Exam  BP (!) 129/93   Pulse 95   Temp 99.3 F (37.4 C) (Oral)   Resp 16   LMP 09/09/2020   SpO2 100%   Physical Exam Vitals and nursing note reviewed.  Constitutional:      General: She is not in acute distress.    Appearance: She is well-developed. She is not diaphoretic.  HENT:     Head: Normocephalic and atraumatic.     Mouth/Throat:     Mouth: Mucous membranes are moist.     Pharynx: Oropharynx is clear.  Eyes:     Conjunctiva/sclera: Conjunctivae normal.  Cardiovascular:     Rate and Rhythm: Normal rate and regular rhythm.     Pulses: Normal pulses.          Radial pulses are 2+ on the right side and 2+ on the left side.       Posterior tibial pulses are 2+ on the right side and 2+ on the left side.  Pulmonary:     Effort: Pulmonary effort is normal. No respiratory distress.     Breath sounds: Normal breath sounds.     Comments: No increased work of breathing.  Speaks full sentences without noted difficulty.  No oxygen requirement.  SPO2 98 to 100% on room air even while speaking or moving. Abdominal:     Palpations: Abdomen is soft.     Tenderness: There is no abdominal tenderness. There is no guarding.  Musculoskeletal:      Cervical back: Neck supple.     Right lower leg: No edema.     Left lower leg: No edema.  Skin:    General: Skin is warm and dry.  Neurological:     Mental Status: She is alert.  Psychiatric:        Mood and Affect: Mood and affect normal.        Speech: Speech normal.        Behavior: Behavior normal.     ED Course/Procedures     Procedures   Abnormal Labs Reviewed  BASIC METABOLIC PANEL - Abnormal; Notable for the following components:      Result Value   Potassium 3.2 (*)    Glucose, Bld 129 (*)    BUN <5 (*)    Calcium 8.5 (*)    All other components within normal limits  CBC - Abnormal; Notable for the following components:   WBC 3.9 (*)    Hemoglobin 11.8 (*)    All other components within normal limits  URINALYSIS, ROUTINE W REFLEX MICROSCOPIC - Abnormal; Notable for the following components:   APPearance CLOUDY (*)    Hgb urine dipstick MODERATE (*)    Ketones, ur 20 (*)    Bacteria, UA  MANY (*)    All other components within normal limits  D-DIMER, QUANTITATIVE - Abnormal; Notable for the following components:   D-Dimer, Quant 0.84 (*)    All other components within normal limits    CT Angio Chest PE W and/or Wo Contrast  Result Date: 09/18/2020 CLINICAL DATA:  Weakness dizziness ache and cough with positive D-dimer concern for pulmonary embolus EXAM: CT ANGIOGRAPHY CHEST WITH CONTRAST TECHNIQUE: Multidetector CT imaging of the chest was performed using the standard protocol during bolus administration of intravenous contrast. Multiplanar CT image reconstructions and MIPs were obtained to evaluate the vascular anatomy. CONTRAST:  40mL OMNIPAQUE IOHEXOL 350 MG/ML SOLN COMPARISON:  Chest radiograph Sep 15, 2020 and chest CT August 03, 2016 FINDINGS: Cardiovascular: Satisfactory opacification of the pulmonary arteries to the segmental level. No evidence of pulmonary embolism. Normal heart size. No pericardial effusion. Mediastinum/Nodes: No enlarged mediastinal,  hilar, or axillary lymph nodes. Thyroid gland, trachea, and esophagus demonstrate no significant findings. Lungs/Pleura: Lungs are clear. No pleural effusion or pneumothorax. Upper Abdomen: No acute abnormality. Musculoskeletal: No chest wall abnormality. No acute or significant osseous findings. Review of the MIP images confirms the above findings. IMPRESSION: No pulmonary embolus to the level of the segmental pulmonary arteries. No aortic dissection. Clear lungs. Electronically Signed   By: Dahlia Bailiff MD   On: 09/18/2020 23:38   DG Chest Portable 1 View  Result Date: 09/18/2020 CLINICAL DATA:  35 year old patient who is positive for COVID-19, presenting with shortness of breath, hypoxia, chest tightness, and cough. EXAM: PORTABLE CHEST 1 VIEW COMPARISON:  09/15/2020 and earlier, including CTA chest 08/03/2016. FINDINGS: Cardiac silhouette and mediastinal contours normal in appearance for the AP portable technique. Pulmonary parenchyma clear. Bronchovascular markings normal. Pulmonary vascularity normal. No pneumothorax. No visible pleural effusions. IMPRESSION: No acute cardiopulmonary disease. Electronically Signed   By: Evangeline Dakin M.D.   On: 09/18/2020 19:47   DG Chest Portable 1 View  Result Date: 09/15/2020 CLINICAL DATA:  Chest pain and fever, initial encounter EXAM: PORTABLE CHEST 1 VIEW COMPARISON:  10/28/2018 FINDINGS: The heart size and mediastinal contours are within normal limits. Both lungs are clear. The visualized skeletal structures are unremarkable. IMPRESSION: No active disease. Electronically Signed   By: Inez Catalina M.D.   On: 09/15/2020 17:26     EKG Interpretation  Date/Time:  Saturday Sep 18 2020 15:53:39 EDT Ventricular Rate:  105 PR Interval:  130 QRS Duration: 68 QT Interval:  314 QTC Calculation: 415 R Axis:   49 Text Interpretation: Sinus tachycardia Cannot rule out Anterior infarct , age undetermined Abnormal ECG When compared to prior, faster rate. No  STEMI Confirmed by Antony Blackbird [phone]) on 09/18/2020 6:09:55 PM       MDM      Took patient care handoff report from Antony Blackbird, MD. Plan: Patient awaiting CT PE study.   Patient had been previously complaining of shortness of breath and pleuritic chest pain. Upon my evaluation, she is nontoxic-appearing, not hypotensive, not tachypneic, no apparent distress.  Mildly tachycardic, but this improved over her ED course. I personally reviewed and interpreted her labs and imaging studies. CT PE study without evidence of PE or other acute abnormality.  Patient ambulated independently while maintaining SPO2 99 to 100% on room air without dyspnea or signs of distress. I discussed the proposed plan for possible admission with the patient.  She expressed a strong preference towards discharge.  Shared decision-making was used.  I did place a prescription for Paxlovid.  The patient was given instructions for home care as well as return precautions. Patient voices understanding of these instructions, accepts the plan, and is comfortable with discharge.   Patient's presentation, original plan, and proposed new plan discussed with Dr. Betsey Holiday after EDP shift change.   Vitals:   09/18/20 1552 09/18/20 1820 09/18/20 1945  BP: 105/71 (!) 119/92 (!) 129/93  Pulse: (!) 129 (!) 109 95  Resp: 20 19 16   Temp: 99.4 F (37.4 C) 99.3 F (37.4 C)   TempSrc: Oral Oral   SpO2: 100% 100% 100%   Vitals:   09/18/20 1820 09/18/20 1945 09/18/20 2215 09/18/20 2329  BP: (!) 119/92 (!) 129/93  139/85  Pulse: (!) 109 95 (!) 121 100  Resp: 19 16  (!) 21  Temp: 99.3 F (37.4 C)   (!) 100.8 F (38.2 C)  TempSrc: Oral   Oral  SpO2: 100% 100% 100% 100%      Layla Maw 09/19/20 2010    Tegeler, Gwenyth Allegra, MD 09/19/20 2033

## 2020-09-18 NOTE — ED Notes (Signed)
Pt was brought back by CT. Test was not completed. Pt states to much pain when fluids pushed in CT. The LAC 20g site was flushed with 19ml of saline after initially inserted, with no complications flushing. Pt did c/o some pain on the initial 10ml flush, but the next 16ml had no pain. Pt requested IV be taken out. MD informed of the situation. Will take IV out and look for a new site.

## 2020-09-18 NOTE — ED Notes (Signed)
Pt was on pulse ox and walked 6 times back and forth in the room. SaO2 was 99-100% the entire time.

## 2020-09-18 NOTE — ED Triage Notes (Signed)
Pt states she was diagnosed with covid a few days ago, since has had weakness, dizziness, cough, and nausea. Has had fevers and diarrhea.

## 2020-09-18 NOTE — ED Provider Notes (Signed)
[facility] EMERGENCY DEPARTMENT Provider Note   CSN: [id number] Arrival date & time: 09/18/20  1544     History Chief Complaint  Patient presents with  . Weakness    Caitlyn Roy is a 35 y.o. female.  The history is provided by the patient and medical records. No language interpreter was used.  Shortness of Breath Severity:  Severe Onset quality:  Gradual Duration:  3 days Timing:  Constant Progression:  Worsening Chronicity:  New Context: URI (covid)   Relieved by:  Nothing Worsened by:  Coughing and deep breathing Ineffective treatments:  None tried Associated symptoms: chest pain, cough, fever, hemoptysis, sputum production and vomiting   Associated symptoms: no diaphoresis, no headaches and no wheezing   Risk factors: no hx of PE/DVT        Past Medical History:  Diagnosis Date  . ADHD   . Anemia   . Anginal pain ([place])    being followed by cardiologists  . Anxiety   . Depression   . Dysrhythmia    palpitation   . Fibroid   . H/O cesarean section 2010  . History of blood transfusion   . Nausea    d/t to "issues with gallballader"  . 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 Tubal pregnancy   . Vaginal odor   . Vaginitis     Patient Active Problem List   Diagnosis Date Noted  . Abnormal uterine bleeding 07/02/2020  . Anemia 07/02/2020  . Disorder of patellofemoral joint 07/02/2020  . Other specified abnormal immunological findings in serum 07/02/2020  . Fatigue 06/02/2020  . 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  . 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 Concentration deficit 03/24/2020  . Ectopic pregnancy 08/07/2018  . S/P repeat low transverse C-section 04/06/2018  . H/O: cesarean section 04/06/2018  . Goiter 08/16/2017  . Acute appendicitis 10/20/2016  . Iron deficiency anemia 03/03/2016  . Palpitations 03/03/2016  . BV (bacterial vaginosis)  10/15/2014    Past Surgical History:  Procedure Laterality Date  . BIOPSY EYE MUSCLE    . CESAREAN SECTION  2010  . CESAREAN SECTION N/A 04/06/2018   Procedure: CESAREAN SECTION bladder irrigation;  Surgeon: [name], MD;  Location: [facility];  Service: Obstetrics;  Laterality: N/A;  needs RNFA  . CHOLECYSTECTOMY N/A 08/14/2019   Procedure: LAPAROSCOPIC CHOLECYSTECTOMY;  Surgeon: Greer Pickerel, MD;  Location: [facility];  Service: General;  Laterality: N/A;  . DIAGNOSTIC LAPAROSCOPY WITH REMOVAL OF ECTOPIC PREGNANCY N/A 08/07/2018   Procedure: DIAGNOSTIC LAPAROSCOPY WITH REMOVAL OF ECTOPIC PREGNANCY;  Surgeon: Janyth Pupa, DO;  Location: [facility];  Service: Gynecology;  Laterality: N/A;  . LAPAROSCOPIC APPENDECTOMY N/A 10/20/2016   Procedure: APPENDECTOMY LAPAROSCOPIC;  Surgeon: Judeth Horn, MD;  Location: [facility];  Service: General;  Laterality: N/A;  . LAPAROSCOPIC SALPINGO OOPHERECTOMY Left 08/07/2018   Procedure: LAPAROSCOPIC SALPINGECTOMY LEFT ;  Surgeon: Janyth Pupa, DO;  Location: [facility];  Service: Gynecology;  Laterality: Left;     OB History    Gravida  9   Para  2   Term  2   Preterm      AB  6   Living  2     SAB  4   IAB  1   Ectopic  1   Multiple  0   Live Births  2           Family History  Problem Relation Age of Onset  . Hypertension Mother   . Breast cancer Mother   . Heart attack Mother   . Seizures Mother   . Alcohol abuse Father   . Kidney disease Maternal Grandmother   . Diabetes Paternal Grandmother     Social History   Tobacco Use  . Smoking status: Never Smoker  . Smokeless tobacco: Never Used  Vaping Use  . Vaping Use: Never used  Substance Use Topics  . Alcohol use: Yes    Comment: Socially  . Drug use: No    Home Medications Prior to Admission medications   Medication Sig Start Date End Date Taking? Authorizing Provider  ferrous sulfate 300 (60 Fe) MG/5ML syrup Take 5 mLs (300 mg total) by mouth daily with  breakfast. 08/18/20   Billie Ruddy, MD  lidocaine (LIDODERM) 5 % Place 1 patch onto the skin daily as needed. Apply patch to area most significant pain once per day.  Remove and discard patch within 12 hours of application. Patient not taking: Reported on 08/18/2020 08/12/20   Petrucelli, Glynda Jaeger, PA-C  methocarbamol (ROBAXIN) 500 MG tablet Take 1 tablet (500 mg total) by mouth every 8 (eight) hours as needed for muscle spasms. Patient not taking: Reported on 08/18/2020 08/12/20   Petrucelli, Glynda Jaeger, PA-C  metroNIDAZOLE (FLAGYL) 500 MG tablet Take 1 tablet (500 mg total) by mouth 2 (two) times daily. Patient not taking: Reported on 08/18/2020 08/08/20   Ezequiel Essex, MD  naproxen (NAPROSYN) 500 MG tablet Take 1 tablet (500 mg total) by mouth 2 (two) times daily as needed for moderate pain. Patient not taking: Reported on 08/18/2020 08/12/20   Petrucelli, Samantha R, PA-C  ondansetron (ZOFRAN ODT) 4 MG disintegrating tablet Take 1 tablet (4 mg total) by mouth every 8 (eight) hours as needed for nausea or vomiting. Patient not taking: Reported on 08/18/2020 08/12/20   Petrucelli, Aldona Bar R, PA-C  ondansetron (ZOFRAN) 4 MG tablet Take 1 tablet (4 mg total) by mouth every 8 (eight) hours as needed for nausea or vomiting. 09/15/20   Halei Hanover, Gwenyth Allegra, MD  sertraline (ZOLOFT) 25 MG tablet Take one tab (25 mg) daily x 1 wk.  Can increase to 2 tabs daily (50 mg) after one week. Patient not taking: Reported on 08/08/2020 05/05/20 08/12/20  Billie Ruddy, MD    Allergies    Penicillins and Compazine [prochlorperazine]  Review of Systems   Review of Systems  Constitutional: Positive for chills, fatigue and fever. Negative for diaphoresis.  HENT: Positive for congestion.   Eyes: Negative for visual disturbance.  Respiratory: Positive for cough, hemoptysis, sputum production, chest tightness and shortness of breath. Negative for wheezing.   Cardiovascular: Positive for chest pain. Negative for  palpitations and leg swelling.  Gastrointestinal: Positive for diarrhea, nausea and vomiting. Negative for constipation.  Genitourinary: Negative for dysuria.  Musculoskeletal: Negative for back pain.  Neurological: Negative for light-headedness, numbness and headaches.  Psychiatric/Behavioral: Negative for agitation.  All other systems reviewed and are negative.   Physical Exam Updated Vital Signs BP 105/71 (BP Location: Left Arm)   Pulse (!) 129   Temp 99.4 F (37.4 C) (Oral)   Resp 20   LMP 09/09/2020   SpO2 100%   Physical Exam Vitals and nursing note reviewed.  Constitutional:      General: She is not in acute distress.    Appearance: She is well-developed. She is ill-appearing. She is not toxic-appearing or diaphoretic.  HENT:  Head: Normocephalic and atraumatic.     Nose: Congestion present. No rhinorrhea.     Mouth/Throat:     Mouth: Mucous membranes are dry.     Pharynx: No oropharyngeal exudate or posterior oropharyngeal erythema.  Eyes:     Extraocular Movements: Extraocular movements intact.     Conjunctiva/sclera: Conjunctivae normal.     Pupils: Pupils are equal, round, and reactive to light.  Cardiovascular:     Rate and Rhythm: Regular rhythm. Tachycardia present.     Heart sounds: No murmur heard.   Pulmonary:     Effort: Pulmonary effort is normal. No respiratory distress.     Breath sounds: Normal breath sounds. No wheezing, rhonchi or rales.  Chest:     Chest wall: No tenderness.  Abdominal:     General: Abdomen is flat.     Palpations: Abdomen is soft.     Tenderness: There is no abdominal tenderness. There is no right CVA tenderness or left CVA tenderness.  Musculoskeletal:        General: No tenderness.     Cervical back: Neck supple. No tenderness.  Skin:    General: Skin is warm and dry.     Capillary Refill: Capillary refill takes less than 2 seconds.  Neurological:     General: No focal deficit present.     Mental Status: She is  alert.  Psychiatric:        Mood and Affect: Mood is anxious.     ED Results / Procedures / Treatments   Labs (all labs ordered are listed, but only abnormal results are displayed) Labs Reviewed  BASIC METABOLIC PANEL - Abnormal; Notable for the following components:      Result Value   Potassium 3.2 (*)    Glucose, Bld 129 (*)    BUN <5 (*)    Calcium 8.5 (*)    All other components within normal limits  CBC - Abnormal; Notable for the following components:   WBC 3.9 (*)    Hemoglobin 11.8 (*)    All other components within normal limits  D-DIMER, QUANTITATIVE - Abnormal; Notable for the following components:   D-Dimer, Quant 0.84 (*)    All other components within normal limits  URINALYSIS, ROUTINE W REFLEX MICROSCOPIC  CBG MONITORING, ED  I-STAT BETA HCG BLOOD, ED ([facility], [facility], [facility] ONLY)  TROPONIN I (HIGH SENSITIVITY)  TROPONIN I (HIGH SENSITIVITY)    EKG EKG Interpretation  Date/Time:  Saturday Sep 18 2020 15:53:39 EDT Ventricular Rate:  105 PR Interval:  130 QRS Duration: 68 QT Interval:  314 QTC Calculation: 415 R Axis:   49 Text Interpretation: Sinus tachycardia Cannot rule out Anterior infarct , age undetermined Abnormal ECG When compared to prior, faster rate. No STEMI Confirmed by Antony Blackbird [phone]) on 09/18/2020 6:09:55 PM   Radiology DG Chest Portable 1 View  Result Date: 09/18/2020 CLINICAL DATA:  35 year old patient who is positive for COVID-19, presenting with shortness of breath, hypoxia, chest tightness, and cough. EXAM: PORTABLE CHEST 1 VIEW COMPARISON:  09/15/2020 and earlier, including CTA chest 08/03/2016. FINDINGS: Cardiac silhouette and mediastinal contours normal in appearance for the [facility] portable technique. Pulmonary parenchyma clear. Bronchovascular markings normal. Pulmonary vascularity normal. No pneumothorax. No visible pleural effusions. IMPRESSION: No acute cardiopulmonary disease. Electronically Signed   By: Evangeline Dakin M.D.   On:  09/18/2020 19:47    Procedures Procedures   IOLA RITTER was evaluated in Emergency Department on 09/18/2020 for the symptoms described in the history  of present illness. She was evaluated in the context of the global COVID-19 pandemic, which necessitated consideration that the patient might be at risk for infection with the SARS-CoV-2 virus that causes COVID-19. Institutional protocols and algorithms that pertain to the evaluation of patients at risk for COVID-19 are in a state of rapid change based on information released by regulatory bodies including the CDC and federal and state organizations. These policies and algorithms were followed during the patient's care in the ED.  CRITICAL CARE Performed by: Gwenyth Allegra Mohammad Granade Total critical care time: 30 minutes Critical care time was exclusive of separately billable procedures and treating other patients. Critical care was necessary to treat or prevent imminent or life-threatening deterioration. Critical care was time spent personally by me on the following activities: development of treatment plan with patient and/or surrogate as well as nursing, discussions with consultants, evaluation of patient's response to treatment, examination of patient, obtaining history from patient or surrogate, ordering and performing treatments and interventions, ordering and review of laboratory studies, ordering and review of radiographic studies, pulse oximetry and re-evaluation of patient's condition.   Medications Ordered in ED Medications  sodium chloride 0.9 % bolus 1,000 mL (1,000 mLs Intravenous New Bag/Given 09/18/20 1937)    ED Course  I have reviewed the triage vital signs and the nursing notes.  Pertinent labs & imaging results that were available during my care of the patient were reviewed by me and considered in my medical decision making (see chart for details).    MDM Rules/Calculators/A&P                          KEILIN GAMBOA is a 35 y.o. female with a past medical history significant for iron deficiency anemia and recent diagnosis of COVID-19 infection who presents with hemoptysis, chest pain, shortness of breath, continued fevers, chills, productive cough, nausea, vomiting, diarrhea and malaise.  I personally took care of this patient several days ago and we diagnosed her with COVID-19.  At that time, her vital signs improved and we felt she was safe for discharge home.  Given her lack of any comorbidities, we elected not to prescribe Paxlovid.  Patient reports that since discharge, she is only had worsening chest tightness, shortness of breath, and started having hemoptysis.  She reports she is having pleuritic discomfort across her chest.  She reports nausea, occasional vomiting, and some new diarrhea.  She reports feeling very fatigued.  Due to her shortness of breath worsening, she presents for evaluation.  On monitor evaluation, patient was tachycardic around 130, was warm to the touch, was tachypneic, and was hypoxic at 85% on room air when I was speaking with her.  On exam, chest is slight tender to palpation and lungs had some coarseness.  No murmur.  Abdomen nontender.  Good pulses in extremities and legs are nontender nonedematous.  EKG shows no STEMI.  Clinically I am concerned we need to rule out PE now that her symptoms have changed.  Due to the hypoxia I do suspect she will need admission.  D-dimer was obtained and was -3 days ago but is now positive at 0.84.  Spoke with patient and we will get a CT PE study.  I spoke with radiology given the ongoing short supply of IV contrast and due to her V/Q likely being indeterminate due to the COVID, they did agree with a CT PE study given our concern.  Patient will get the  CT PE study and then will be admitted for hypoxia and worsened breathing troubles in the setting of COVID-19 infection.   Final Clinical Impression(s) / ED Diagnoses Final diagnoses:   COVID-19  Hypoxia  Shortness of breath  Hemoptysis    Clinical Impression: 1. COVID-19   2. Hypoxia   3. Shortness of breath   4. Hemoptysis     Disposition: Care transferred to oncoming team awaiting for results of PE study prior to admission for hypoxia with COVID.  This note was prepared with assistance of Systems analyst. Occasional wrong-word or sound-a-like substitutions may have occurred due to the inherent limitations of voice recognition software.      Oriel Ojo, Gwenyth Allegra, MD 09/18/20 2155

## 2020-09-19 MED ORDER — ONDANSETRON 4 MG PO TBDP: 4.0000 mg | ORAL_TABLET | Freq: Three times a day (TID) | ORAL | 0 refills | Status: DC | PRN

## 2020-09-19 MED ORDER — NIRMATRELVIR/RITONAVIR (PAXLOVID)TABLET: 3.0000 | ORAL_TABLET | Freq: Two times a day (BID) | ORAL | 0 refills | Status: AC

## 2020-09-19 MED ORDER — ACETAMINOPHEN 325 MG PO TABS
650.0000 mg | ORAL_TABLET | Freq: Once | ORAL | Status: AC
  Administered 2020-09-19: 650 mg via ORAL
  Filled 2020-09-19: qty 2

## 2020-09-19 MED ORDER — ALBUTEROL SULFATE HFA 108 (90 BASE) MCG/ACT IN AERS: 1.0000 | INHALATION_SPRAY | Freq: Four times a day (QID) | RESPIRATORY_TRACT | 0 refills | Status: DC | PRN

## 2020-09-19 NOTE — Discharge Instructions (Signed)
COVID-19 Home Management:  You have had a positive test for COVID-19. COVID-19 is caused by a virus. Viruses do not require or respond to antibiotics. Treatment is symptomatic care and it is important to note that these symptoms may last for 7-14 days.   Hand washing: Wash your hands throughout the day, but especially before and after touching the face, using the restroom, sneezing, coughing, or touching surfaces that have been coughed or sneezed upon. Hydration: Symptoms of most illnesses will be intensified and complicated by dehydration. Dehydration can also extend the duration of symptoms. Drink plenty of fluids and get plenty of rest. You should be drinking at least half a liter of water an hour to stay hydrated. Electrolyte drinks (ex. Gatorade, Powerade, Pedialyte) are also encouraged. You should be drinking enough fluids to make your urine light yellow, almost clear. If this is not the case, you are not drinking enough water. Please note that some of the treatments indicated below will not be effective if you are not adequately hydrated. Diet: Please concentrate on hydration, however, you may introduce food slowly.  Start with a clear liquid diet, progressed to a full liquid diet, and then bland solids as you are able. Pain or fever: Ibuprofen, Naproxen, or acetaminophen (generic for Tylenol) for pain or fever.  Antiinflammatory medications: Take 600 mg of ibuprofen every 6 hours or 440 mg (over the counter dose) to 500 mg (prescription dose) of naproxen every 12 hours for the next 3 days. After this time, these medications may be used as needed for pain. Take these medications with food to avoid upset stomach. Choose only one of these medications, do not take them together. Acetaminophen (generic for Tylenol): Should you continue to have additional pain while taking the ibuprofen or naproxen, you may add in acetaminophen as needed. Your daily total maximum amount of acetaminophen from all sources  should be limited to 4000mg /day for persons without liver problems, or 2000mg /day for those with liver problems. Nausea/vomiting: Use the ondansetron (generic for Zofran) for nausea or vomiting.  This medication may not prevent all vomiting or nausea, but can help facilitate better hydration. Things that can help with nausea/vomiting also include peppermint/menthol candies, vitamin B12, and ginger. Diarrhea: May use medications such as loperamide (Imodium) or Bismuth subsalicylate (Pepto-Bismol). Cough: Teas, warm liquids, broths, and honey can help with cough. Albuterol: May use the albuterol as needed for instances of shortness of breath. Zyrtec or Claritin: May add these medication daily to control underlying symptoms of congestion, sneezing, and other signs of allergies.  These medications are available over-the-counter. Generics: Cetirizine (generic for Zyrtec) and loratadine (generic for Claritin). Fluticasone: Use fluticasone (generic for Flonase), as directed, for nasal and sinus congestion.  This medication is available over-the-counter. Congestion: Plain guaifenesin (generic for plain Mucinex) may help relieve congestion. Saline sinus rinses and saline nasal sprays may also help relieve congestion.  Sore throat: Warm liquids or Chloraseptic spray may help soothe a sore throat. Gargle twice a day with a salt water solution made from a half teaspoon of salt in a cup of warm water.  Follow up: Follow up with a primary care provider within the next two weeks should symptoms fail to resolve. Return: Return to the ED for significantly worsening symptoms, shortness of breath, persistent/worsening chest pain, persistent vomiting, large amounts of blood in stool, worsening/localized abdominal pain, or any other major concerns.  For prescription assistance, may try using prescription discount sites or apps, such as goodrx.com  COVID-19 isolation recommendations  Patients who have symptoms  consistent with COVID-19 should self isolate until: At least 3 days (72 hours) have passed since recovery, defined as resolution of fever without the use of fever reducing medications and improvement in respiratory symptoms (e.g., cough, shortness of breath), and At least 7 days have passed since symptoms first appeared. Retesting is not required and not recommended as patients can continue to test positive for several weeks despite lack of symptoms.

## 2020-10-13 ENCOUNTER — Other Ambulatory Visit: Payer: Self-pay

## 2020-10-14 ENCOUNTER — Ambulatory Visit (INDEPENDENT_AMBULATORY_CARE_PROVIDER_SITE_OTHER): Payer: Medicaid Other | Admitting: Family Medicine

## 2020-10-14 ENCOUNTER — Encounter: Payer: Self-pay | Admitting: Family Medicine

## 2020-10-14 VITALS — BP 102/76 | HR 88 | Temp 98.6°F | Wt 149.8 lb

## 2020-10-14 DIAGNOSIS — E876 Hypokalemia: Secondary | ICD-10-CM | POA: Diagnosis not present

## 2020-10-14 DIAGNOSIS — E538 Deficiency of other specified B group vitamins: Secondary | ICD-10-CM | POA: Diagnosis not present

## 2020-10-14 DIAGNOSIS — H539 Unspecified visual disturbance: Secondary | ICD-10-CM | POA: Diagnosis not present

## 2020-10-14 DIAGNOSIS — R200 Anesthesia of skin: Secondary | ICD-10-CM

## 2020-10-14 DIAGNOSIS — R5383 Other fatigue: Secondary | ICD-10-CM

## 2020-10-14 DIAGNOSIS — J4520 Mild intermittent asthma, uncomplicated: Secondary | ICD-10-CM | POA: Diagnosis not present

## 2020-10-14 LAB — CBC WITH DIFFERENTIAL/PLATELET
Basophils Absolute: 0.1 10*3/uL (ref 0.0–0.1)
Basophils Relative: 1.1 % (ref 0.0–3.0)
Eosinophils Absolute: 0.1 10*3/uL (ref 0.0–0.7)
Eosinophils Relative: 2.4 % (ref 0.0–5.0)
HCT: 36.9 % (ref 36.0–46.0)
Hemoglobin: 12.1 g/dL (ref 12.0–15.0)
Lymphocytes Relative: 36.5 % (ref 12.0–46.0)
Lymphs Abs: 2 10*3/uL (ref 0.7–4.0)
MCHC: 32.8 g/dL (ref 30.0–36.0)
MCV: 82.9 fl (ref 78.0–100.0)
Monocytes Absolute: 0.4 10*3/uL (ref 0.1–1.0)
Monocytes Relative: 6.8 % (ref 3.0–12.0)
Neutro Abs: 3 10*3/uL (ref 1.4–7.7)
Neutrophils Relative %: 53.2 % (ref 43.0–77.0)
Platelets: 326 10*3/uL (ref 150.0–400.0)
RBC: 4.45 Mil/uL (ref 3.87–5.11)
RDW: 15.4 % (ref 11.5–15.5)
WBC: 5.6 10*3/uL (ref 4.0–10.5)

## 2020-10-14 LAB — BASIC METABOLIC PANEL
BUN: 6 mg/dL (ref 6–23)
CO2: 27 mEq/L (ref 19–32)
Calcium: 9 mg/dL (ref 8.4–10.5)
Chloride: 103 mEq/L (ref 96–112)
Creatinine, Ser: 0.83 mg/dL (ref 0.40–1.20)
GFR: 91.52 mL/min (ref >60.00)
Glucose, Bld: 80 mg/dL (ref 70–99)
Potassium: 4.4 mEq/L (ref 3.5–5.1)
Sodium: 137 mEq/L (ref 135–145)

## 2020-10-14 LAB — COMPLETE METABOLIC PANEL WITH GFR
AG Ratio: 1.4 (calc) (ref 1.0–2.5)
ALT: 15 U/L (ref 6–29)
AST: 17 U/L (ref 10–30)
Albumin: 4.3 g/dL (ref 3.6–5.1)
Alkaline phosphatase (APISO): 86 U/L (ref 31–125)
BUN/Creatinine Ratio: 8 (calc) (ref 6–22)
BUN: 6 mg/dL — ABNORMAL LOW (ref 7–25)
CO2: 27 mmol/L (ref 20–32)
Calcium: 9.2 mg/dL (ref 8.6–10.2)
Chloride: 103 mmol/L (ref 98–110)
Creat: 0.78 mg/dL (ref 0.50–1.10)
GFR, Est African American: 114 mL/min/{1.73_m2} (ref >=60)
GFR, Est Non African American: 98 mL/min/{1.73_m2} (ref >=60)
Globulin: 3.1 g/dL (calc) (ref 1.9–3.7)
Glucose, Bld: 74 mg/dL (ref 65–99)
Potassium: 4.7 mmol/L (ref 3.5–5.3)
Sodium: 138 mmol/L (ref 135–146)
Total Bilirubin: 0.3 mg/dL (ref 0.2–1.2)
Total Protein: 7.4 g/dL (ref 6.1–8.1)

## 2020-10-14 LAB — VITAMIN B12: Vitamin B-12: 632 pg/mL (ref 211–911)

## 2020-10-14 LAB — MAGNESIUM: Magnesium: 1.9 mg/dL (ref 1.5–2.5)

## 2020-10-14 LAB — TSH: TSH: 1.47 u[IU]/mL (ref 0.35–4.50)

## 2020-10-14 LAB — FOLATE: Folate: 19.9 ng/mL (ref >5.9)

## 2020-10-14 LAB — T4, FREE: Free T4: 0.97 ng/dL (ref 0.60–1.60)

## 2020-10-14 MED ORDER — CYANOCOBALAMIN 1000 MCG/ML IJ SOLN
1000.0000 ug | Freq: Once | INTRAMUSCULAR | Status: AC
  Administered 2020-10-14: 1000 ug via INTRAMUSCULAR

## 2020-10-14 MED ORDER — ALBUTEROL SULFATE HFA 108 (90 BASE) MCG/ACT IN AERS: 1.0000 | INHALATION_SPRAY | Freq: Four times a day (QID) | RESPIRATORY_TRACT | 0 refills | Status: AC | PRN

## 2020-10-14 NOTE — Progress Notes (Signed)
Subjective:    Patient ID: Caitlyn Roy, female    DOB: 30-Nov-1985, 35 y.o.   MRN: [id number]  No chief complaint on file.   HPI Patient was seen today for f/u.  Pt notes brief increased energy immediately after B12 injection in late 2021.  Patient interested in doing B12 injections more consistently.  Patient diagnosed with COVID last month.  Doing better since.  Was having shortness of breath and given albuterol inhaler, but threw away the rx.  Requesting refill.  Notes blurred vision at times.  Considering moving to [place], [place].    Patient with positive ANA but other testing negative.  Still following with rheumatology.  No new updates.  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 Yes Did it involve sudden or severe rash/hives, skin peeling, or any reaction on the inside of your mouth or nose? No Did you need to seek medical attention at a hospital or doctor's office? No When did it last happen?  2020     If all above answers are "NO", may proceed with cephalosporin use.    Compazine [Prochlorperazine] Anxiety    Pt anxious and jittery stating she can't get comfortable    ROS General: Denies fever, chills, night sweats, changes in weight, changes in appetite + fatigue HEENT: Denies headaches, ear pain, changes in vision, rhinorrhea, sore throat + blurred vision CV: Denies CP, palpitations, orthopnea  + SOB Pulm: Denies SOB, cough,  wheezing GI: Denies abdominal pain, nausea, vomiting, diarrhea, constipation GU: Denies dysuria, hematuria, frequency, vaginal discharge Msk: Denies muscle cramps, joint pains Neuro: Denies weakness, numbness, tingling Skin: Denies rashes, bruising Psych: Denies depression, anxiety, hallucinations     Objective:    Blood pressure 102/76, pulse 88, temperature 98.6 F (37 C), temperature source Oral, weight 149 lb 12.8 oz (67.9 kg), SpO2 99 %.  Gen. Pleasant, well-nourished, in no distress, normal affect   HEENT: [place]/AT, face symmetric, conjunctiva clear, no scleral icterus, PERRLA, EOMI, nares patent without drainage Lungs: no accessory muscle use, CTAB, no wheezes or rales Cardiovascular: RRR, no m/r/g, no peripheral edema Musculoskeletal: No deformities, no cyanosis or clubbing, normal tone Neuro:  A&Ox3, CN II-XII intact, normal gait Skin:  Warm, no lesions/ rash   Wt Readings from Last 3 Encounters:  10/14/20 149 lb 12.8 oz (67.9 kg)  08/18/20 153 lb 12.8 oz (69.8 kg)  08/07/20 160 lb 15 oz (73 kg)    Lab Results  Component Value Date   WBC 3.9 (L) 09/18/2020   HGB 11.8 (L) 09/18/2020   HCT 37.8 09/18/2020   PLT 266 09/18/2020   GLUCOSE 129 (H) 09/18/2020   ALT 24 09/15/2020   AST 26 09/15/2020   NA 136 09/18/2020   K 3.2 (L) 09/18/2020   CL 103 09/18/2020  CREATININE 0.93 09/18/2020   BUN <5 (L) 09/18/2020   CO2 25 09/18/2020   TSH 1.56 09/04/2019    Assessment/Plan:  Fatigue, unspecified type -improving -recheck vitamin b12 -encouraged to exercise  Vitamin B 12 deficiency - Plan: Vitamin B12, Vitamin B12, CBC with Differential/Platelet, cyanocobalamin ((VITAMIN B-12)) injection 1,000 mcg  Mild intermittent reactive airway disease without complication  -stable - Plan: albuterol (VENTOLIN HFA) 108 (90 Base) MCG/ACT inhaler  Hypokalemia  -K+ 3.2 on 09/18/2020 -Recheck potassium.  Replete if needed - Plan: Basic metabolic panel  Vision  changes -Discussed possible causes including hyperglycemia, elevated BP, dehydration, seasonal allergies -Patient advised to have vision checked as may benefit from glasses. -Consider bluelight blocking glasses for during increased screen time - Plan: CMP with eGFR([facility]), CBC with Differential/Platelet   Labs previously ordered this year released this visit and noted under diagnosis of numbness which has since improved.  Labs include: CBC, TSH, free T4, folate, mag, CMP, B vitamin B-12  F/u prn  Grier Mitts, MD

## 2020-10-15 ENCOUNTER — Telehealth: Payer: Self-pay

## 2020-10-15 NOTE — Telephone Encounter (Signed)
There is no specific definitive test for MS. The expert who typically evaluates for this problem would be a neurologist to look at a combination of symptoms and tests to determine the diagnosis or if more advanced testing is needed.

## 2020-10-15 NOTE — Telephone Encounter (Signed)
Patient left a voicemail requesting a return call to see if she can have a test to rule out MS diagnosis.

## 2020-10-15 NOTE — Telephone Encounter (Signed)
Spoke with patient, advised there is no specific definitive test for MS. The expert who typically evaluates for this problem would be a neurologist to look at a combination of symptoms and tests to determine the diagnosis or if more advanced testing is needed. Advised patient at follow-up visit possible referral can be discussed.

## 2020-10-25 ENCOUNTER — Telehealth: Payer: Self-pay | Admitting: Family Medicine

## 2020-10-25 NOTE — Telephone Encounter (Signed)
Physician's Medical Release Form to be filled out--placed in dr's folder.  Fax to: [phone], Attn: [facility]

## 2020-10-25 NOTE — Telephone Encounter (Signed)
Form rec'd, placed on providers desk. 

## 2020-10-27 ENCOUNTER — Encounter: Payer: Self-pay | Admitting: Family Medicine

## 2020-11-01 NOTE — Telephone Encounter (Signed)
Pt is calling it check the status of her form and she is aware that we ask for 3-5 business days and the provider does not work on Tuesday and she should look to hear back from the office around Wednesday 11/03/2020 or Thursday at the [place] stated that she will give the office a call back on Wednesday to check the status b/c she is needing the form back so that she doesn't lose her job.

## 2020-11-02 ENCOUNTER — Ambulatory Visit (INDEPENDENT_AMBULATORY_CARE_PROVIDER_SITE_OTHER): Payer: Medicaid Other | Admitting: Internal Medicine

## 2020-11-02 ENCOUNTER — Encounter (HOSPITAL_COMMUNITY): Payer: Self-pay | Admitting: Emergency Medicine

## 2020-11-02 ENCOUNTER — Other Ambulatory Visit: Payer: Self-pay

## 2020-11-02 ENCOUNTER — Ambulatory Visit (HOSPITAL_COMMUNITY)
Admission: EM | Admit: 2020-11-02 | Discharge: 2020-11-02 | Disposition: A | Discharge status: Home / Self Care | Payer: Medicaid Other | Attending: Family Medicine | Admitting: Family Medicine

## 2020-11-02 ENCOUNTER — Encounter: Payer: Self-pay | Admitting: Internal Medicine

## 2020-11-02 VITALS — BP 123/80 | HR 102 | Ht 62.5 in | Wt 152.4 lb

## 2020-11-02 DIAGNOSIS — M222X1 Patellofemoral disorders, right knee: Secondary | ICD-10-CM

## 2020-11-02 DIAGNOSIS — M222X2 Patellofemoral disorders, left knee: Secondary | ICD-10-CM

## 2020-11-02 DIAGNOSIS — M549 Dorsalgia, unspecified: Secondary | ICD-10-CM | POA: Diagnosis not present

## 2020-11-02 NOTE — ED Triage Notes (Signed)
Left lower quadrant pain 1 week.  Vaginal discharge for 1 week - reports it resembles a yeast infection - she gets these frequently.

## 2020-11-02 NOTE — Progress Notes (Signed)
Office Visit Note  Patient: Caitlyn Roy             Date of Birth: 06-17-1985           MRN: [id number]             PCP: Billie Ruddy, MD Referring: Billie Ruddy, MD Visit Date: 11/02/2020   Subjective:  Follow-up (Patient complains of bilateral knee pain and back pain. )   History of Present Illness: Caitlyn Roy is a 35 y.o. female here for follow up previously seen in February for knee injection with joint pain suspected to be patellofemoral pain syndrome. She also had persistent fatigue with no particular autoimmune disease findings workup here or previously with [facility]. She was ill with COVID and seen twice at the hospital last month for this problem.  Today her bilateral knee pain is more severe again and also upper back pain.  She had almost no relief with steroid injections at previous clinic visit.   Previous HPI: 06/02/20 Caitlyn Roy is a 35 y.o. female here for evaluation of positive ANA with chronic fatigue, previously seen by [facility] for evaluation for SLE. She has a history of chronic fatigue dating back to young adulthood that is persistent. This does not typically change in response to sleep and does not typically take naps. She has a history of goiter but no thyroid dysfunction. She previously took stimulant medication for concentration deficit. She also has joint pain affecting the back, knees, and ankles that is more recent and mostly problematic during the past year. Workup for this demonstrated a positive AND and elevated ESR and mild anemia. She was seen by [facility] Dr. Dossie Der who did not see evidence for systemic lupus at that time and did not recommend any treatment for this. Because of persistent symptoms and no clear alternative cause of lab results identified, she is here for another assessment of current findings and symptoms. Besides the above problems she also notices dry eyes and mouth, episode of numbness in her fingers, and  episode of axillary lymphadenopathy she thinks was related to a cough and respiratory symptoms. There is some hair thinning but mostly noticed while wearing a tightly woven hairstyle. She has 2 children and had several early term miscarriages before that but no major pregnancy complications. She denies raynaud's phenomenon, pleurisy, skin rashes, eye inflammation, or history of blood clots or abnormal bleeding. She is adopted but from limited knowledge of maternal history no specific known autoimmune disease.   Labs reviewed 10/2019 ANA 1:320 Dense fine speckled ESR 33 CRP wnl CBC - Hgb 11.2  Review of Systems  Constitutional:  Positive for fatigue.  HENT:  Negative for mouth sores, mouth dryness and nose dryness.   Eyes:  Positive for itching and dryness. Negative for pain and visual disturbance.  Respiratory:  Negative for cough, hemoptysis, shortness of breath and difficulty breathing.   Cardiovascular:  Positive for palpitations. Negative for chest pain and swelling in legs/feet.  Gastrointestinal:  Positive for abdominal pain. Negative for blood in stool, constipation and diarrhea.  Endocrine: Negative for increased urination.  Genitourinary:  Negative for painful urination.  Musculoskeletal:  Positive for joint pain, joint pain, joint swelling, myalgias, muscle weakness, morning stiffness, muscle tenderness and myalgias.  Skin:  Negative for color change, rash and redness.  Allergic/Immunologic: Negative for susceptible to infections.  Neurological:  Positive for dizziness, headaches and memory loss. Negative for numbness and weakness.  Hematological:  Negative for  swollen glands.  Psychiatric/Behavioral:  Positive for confusion. Negative for sleep disturbance.    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   Concentration deficit 03/24/2020   Ectopic pregnancy 08/07/2018   S/P repeat low transverse C-section 04/06/2018   H/O: cesarean section 04/06/2018   Goiter 08/16/2017   Acute appendicitis 10/20/2016   Iron deficiency anemia 03/03/2016   Palpitations 03/03/2016   BV (bacterial vaginosis) 10/15/2014    Past Medical History:  Diagnosis Date   ADHD    Anemia    Anginal pain ([place])    being followed by cardiologists   Anxiety    Depression    Dysrhythmia    palpitation    Fibroid    H/O cesarean section 2010   History of blood transfusion    Nausea    d/t to "issues with gallballader"   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; [facility]   Tubal pregnancy    Vaginal odor    Vaginitis     Family History  Problem Relation Age of Onset   Hypertension Mother    Breast cancer Mother    Heart attack Mother    Seizures Mother    Alcohol abuse Father    Kidney disease Maternal Grandmother    Diabetes Paternal Grandmother    Past Surgical History:  Procedure Laterality Date   BIOPSY EYE MUSCLE     CESAREAN SECTION  2010   CESAREAN SECTION N/A 04/06/2018   Procedure: CESAREAN SECTION bladder irrigation;  Surgeon: [name], MD;  Location: [place];  Service: Obstetrics;  Laterality: N/A;  needs RNFA   CHOLECYSTECTOMY N/A 08/14/2019   Procedure: LAPAROSCOPIC CHOLECYSTECTOMY;  Surgeon: Greer Pickerel, MD;  Location: [facility];  Service: General;  Laterality: N/A;   DIAGNOSTIC LAPAROSCOPY WITH REMOVAL OF ECTOPIC PREGNANCY N/A 08/07/2018   Procedure: DIAGNOSTIC LAPAROSCOPY WITH REMOVAL OF ECTOPIC PREGNANCY;  Surgeon: Janyth Pupa, DO;  Location: [place];  Service: Gynecology;  Laterality: N/A;   LAPAROSCOPIC APPENDECTOMY N/A 10/20/2016   Procedure: APPENDECTOMY  LAPAROSCOPIC;  Surgeon: Judeth Horn, MD;  Location: [place];  Service: General;  Laterality: N/A;   LAPAROSCOPIC SALPINGO OOPHERECTOMY Left 08/07/2018   Procedure: LAPAROSCOPIC SALPINGECTOMY LEFT ;  Surgeon: Janyth Pupa, DO;  Location: [place];  Service: Gynecology;  Laterality: Left;   Social History   Social History Narrative   Lives at home with son   Drinks no caffeine    Immunization History  Administered Date(s) Administered   Influenza Split 03/09/2015   Tdap 01/30/2018     Objective: Vital Signs: BP 123/80 (BP Location: Left Arm, Patient Position: Sitting, Cuff Size: Normal)   Pulse (!) 102   Ht 5' 2.5" (1.588 m)   Wt 152 lb 6.4 oz (69.1 kg)   LMP 10/25/2020   BMI 27.43 kg/m    Physical Exam Skin:    General: Skin is warm and dry.     Findings: No rash.  Neurological:     General: No focal deficit present.     Mental Status: She is alert.  Psychiatric:        Mood and Affect: Mood normal.  Musculoskeletal Exam:  Shoulders full ROM no tenderness or swelling, tenderness to palpation over paraspinal muscles and around trapezius and levator scapulae Elbows full ROM no tenderness or swelling Wrists full ROM no tenderness or swelling Fingers full ROM no tenderness or swelling Knees full ROM, tenderness to palpation anteriorly of the patella worst near the inferior border but also some tenderness at superior border, no pain to patellar grind test or palpation of patellar tendon Ankles full ROM no tenderness or swelling  Investigation: No additional findings.  Imaging: No results found.  Recent Labs: Lab Results  Component Value Date   WBC 5.6 10/14/2020   HGB 12.1 10/14/2020   PLT 326.0 10/14/2020   NA 137 10/14/2020   NA 138 10/14/2020   K 4.4 10/14/2020   K 4.7 10/14/2020   CL 103 10/14/2020   CL 103 10/14/2020   CO2 27 10/14/2020   CO2 27 10/14/2020   GLUCOSE 80 10/14/2020   GLUCOSE 74 10/14/2020   BUN 6 10/14/2020   BUN 6 (L) 10/14/2020    CREATININE 0.83 10/14/2020   CREATININE 0.78 10/14/2020   BILITOT 0.3 10/14/2020   ALKPHOS 82 09/15/2020   AST 17 10/14/2020   ALT 15 10/14/2020   PROT 7.4 10/14/2020   ALBUMIN 3.9 09/15/2020   CALCIUM 9.0 10/14/2020   CALCIUM 9.2 10/14/2020   GFRAA 114 10/14/2020    Speciality Comments: No specialty comments available.  Procedures:  No procedures performed Allergies: Penicillins and Compazine [prochlorperazine]   Assessment / Plan:     Visit Diagnoses: Patellofemoral pain syndrome of both knees  Upper back pain - Plan: Ambulatory referral to Physical Medicine Rehab  Pain and upper back and bilateral knees with no inflammatory changes seen on exam today.  This remains consistent with previous evaluation and with almost complete lack of improvement to intra-articular steroid injection.  Suspicion is still for patellofemoral pain syndrome with the bilateral knee involvement.  Will refer to physical medicine rehab for additional evaluation and treatment of this problem.    Orders: Orders Placed This Encounter  Procedures   Ambulatory referral to Physical Medicine Rehab    No orders of the defined types were placed in this encounter.    Follow-Up Instructions: No follow-ups on file.   Collier Salina, MD  Note - This record has been created using [employer].  Chart creation errors have been sought, but may not always  have been located. Such creation errors do not reflect on  the standard of medical care.

## 2020-11-02 NOTE — ED Notes (Signed)
Pt states that she needed to go.

## 2020-11-03 ENCOUNTER — Other Ambulatory Visit: Payer: Self-pay

## 2020-11-03 ENCOUNTER — Encounter (HOSPITAL_COMMUNITY): Payer: Self-pay

## 2020-11-03 ENCOUNTER — Ambulatory Visit (HOSPITAL_COMMUNITY)
Admission: EM | Admit: 2020-11-03 | Discharge: 2020-11-03 | Disposition: A | Discharge status: Home / Self Care | Payer: Medicaid Other | Attending: Emergency Medicine | Admitting: Emergency Medicine

## 2020-11-03 DIAGNOSIS — B3731 Acute candidiasis of vulva and vagina: Secondary | ICD-10-CM

## 2020-11-03 DIAGNOSIS — B373 Candidiasis of vulva and vagina: Secondary | ICD-10-CM

## 2020-11-03 LAB — POCT URINALYSIS DIPSTICK, ED / UC
Glucose, UA: NEGATIVE mg/dL
Leukocytes,Ua: NEGATIVE
Nitrite: NEGATIVE
Protein, ur: NEGATIVE mg/dL
Specific Gravity, Urine: >=1.03 (ref 1.005–1.030)
Urobilinogen, UA: 0.2 mg/dL (ref 0.0–1.0)
pH: 6 (ref 5.0–8.0)

## 2020-11-03 LAB — POC URINE PREG, ED: Preg Test, Ur: NEGATIVE

## 2020-11-03 MED ORDER — FLUCONAZOLE 150 MG PO TABS: ORAL_TABLET | ORAL | 0 refills | Status: DC

## 2020-11-03 NOTE — Discharge Instructions (Addendum)
Take the Diflucan one pill today and the second pill in 3 days if you are are still having symptoms.   Make sure you are drinking plenty of fluids.    We will contact you with the results from your lab work and any additional treatment.     Return or go to the Emergency Department if symptoms worsen or do not improve in the next few days.

## 2020-11-03 NOTE — ED Provider Notes (Signed)
[place]    CSN: [id number] Arrival date & time: 11/03/20  1354      History   Chief Complaint Chief Complaint  Patient presents with   Vaginal Discharge    HPI Caitlyn Roy is a 35 y.o. female.   Patient here for evaluation of vaginal discharge and irritation that was been ongoing for the past 3 to 4 days.  Denies any odor.  Denies any dysuria, urgency, or frequency.  Reports symptoms similar to yeast infections that she has had in the past.  Reports being treated with Diflucan for similar symptoms in the past.  Has not tried any OTC medications or treatments.  Denies any trauma, injury, or other precipitating event.  Denies any specific alleviating or aggravating factors.  Denies any fevers, chest pain, shortness of breath, N/V/D, numbness, tingling, weakness, abdominal pain, or headaches.     The history is provided by the patient.  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; [facility]   Tubal pregnancy    Vaginal odor    Vaginitis     Patient Active Problem List   Diagnosis Date Noted   Upper back pain 11/02/2020   Abnormal uterine bleeding 07/02/2020   Anemia 07/02/2020   Disorder of patellofemoral joint 07/02/2020   Other specified abnormal immunological findings in serum 07/02/2020   Fatigue 06/02/2020   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   Concentration deficit 03/24/2020   Ectopic pregnancy 08/07/2018   S/P repeat low transverse C-section 04/06/2018   H/O: cesarean section 04/06/2018   Goiter 08/16/2017   Acute appendicitis  10/20/2016   Iron deficiency anemia 03/03/2016   Palpitations 03/03/2016   BV (bacterial vaginosis) 10/15/2014    Past Surgical History:  Procedure Laterality Date   BIOPSY EYE MUSCLE     CESAREAN SECTION  2010   CESAREAN SECTION N/A 04/06/2018   Procedure: CESAREAN SECTION bladder irrigation;  Surgeon: [name], MD;  Location: [place];  Service: Obstetrics;  Laterality: N/A;  needs RNFA   CHOLECYSTECTOMY N/A 08/14/2019   Procedure: LAPAROSCOPIC CHOLECYSTECTOMY;  Surgeon: Greer Pickerel, MD;  Location[facility];  Service: General;  Laterality: N/A;   DIAGNOSTIC LAPAROSCOPY WITH REMOVAL OF ECTOPIC PREGNANCY N/A 08/07/2018   Procedure: DIAGNOSTIC LAPAROSCOPY WITH REMOVAL OF ECTOPIC PREGNANCY;  Surgeon: Janyth Pupa, DO;  Location: [place];  Service: Gynecology;  Laterality: N/A;   LAPAROSCOPIC APPENDECTOMY N/A 10/20/2016   Procedure: APPENDECTOMY LAPAROSCOPIC;  Surgeon: Judeth Horn, MD;  Location: [place];  Service: General;  Laterality: N/A;   LAPAROSCOPIC SALPINGO OOPHERECTOMY Left 08/07/2018   Procedure: LAPAROSCOPIC SALPINGECTOMY LEFT ;  Surgeon: Janyth Pupa, DO;  Location: [place];  Service: Gynecology;  Laterality: Left;    OB History     Gravida  9   Para  2   Term  2   Preterm      AB  6   Living  2      SAB  4   IAB  1   Ectopic  1   Multiple  0   Live Births  2            Home Medications    Prior to Admission medications   Medication Sig Start Date End Date Taking? Authorizing Provider  fluconazole (DIFLUCAN) 150 MG tablet Take one pill today.  Take the second pill in 3 days if you are are still having symptoms. 11/03/20  Yes Pearson Forster, NP  albuterol (VENTOLIN HFA) 108 (90 Base) MCG/ACT inhaler Inhale 1-2 puffs into the lungs every 6 (six) hours as needed for wheezing or shortness of breath. 10/14/20   Billie Ruddy, MD  ferrous sulfate 300 (60 Fe) MG/5ML syrup Take 5 mLs (300 mg total) by mouth daily with breakfast. 08/18/20   Billie Ruddy, MD  lidocaine (LIDODERM) 5 % Place 1 patch onto the skin daily as needed. Apply patch to area most significant pain once per day.  Remove and discard patch within 12 hours of application. Patient not taking: No sig reported 08/12/20   Petrucelli, Samantha R, PA-C  methocarbamol (ROBAXIN) 500 MG tablet Take 1 tablet (500 mg total) by mouth every 8 (eight) hours as needed for muscle spasms. Patient not taking: No sig reported 08/12/20   Petrucelli, Samantha R, PA-C  metroNIDAZOLE (FLAGYL) 500 MG tablet Take 1 tablet (500 mg total) by mouth 2 (two) times daily. Patient not taking: No sig reported 08/08/20   Rancour, Annie Main, MD  naproxen (NAPROSYN) 500 MG tablet Take 1 tablet (500 mg total) by mouth 2 (two) times daily as needed for moderate pain. Patient not taking: No sig reported 08/12/20   Petrucelli, Samantha R, PA-C  ondansetron (ZOFRAN ODT) 4 MG disintegrating tablet Take 1 tablet (4 mg total) by mouth every 8 (eight) hours as needed for nausea or vomiting. Patient not taking: No sig reported 09/19/20   Joy, Shawn C, PA-C  ondansetron (ZOFRAN) 4 MG tablet Take 1 tablet (4 mg total) by mouth every 8 (eight) hours as needed for nausea or vomiting. Patient not taking: No sig reported 09/15/20   Tegeler, Gwenyth Allegra, MD  sertraline (ZOLOFT) 25 MG tablet Take one tab (25 mg) daily x 1 wk.  Can increase to 2 tabs daily (50 mg) after one week. Patient not taking: Reported on 08/08/2020 05/05/20 08/12/20  Billie Ruddy, MD    Family History Family History  Problem Relation Age of Onset   Hypertension Mother    Breast cancer Mother    Heart attack Mother    Seizures Mother    Alcohol abuse Father    Kidney disease Maternal Grandmother    Diabetes Paternal Grandmother     Social History Social History   Tobacco Use   Smoking status: Never   Smokeless tobacco: Never  Vaping Use   Vaping Use: Never used  Substance Use Topics   Alcohol use: Yes    Comment: Socially   Drug use: No      Allergies   Penicillins and Compazine [prochlorperazine]   Review of Systems Review of Systems  Genitourinary:  Positive for vaginal discharge.  All other systems reviewed and are negative.   Physical Exam Triage Vital Signs ED Triage Vitals  Enc Vitals Group     BP 11/03/20 1427 118/73     Pulse Rate 11/03/20 1427 100     Resp 11/03/20 1427 16     Temp 11/03/20 1427 99.3 F (37.4 C)     Temp Source  11/03/20 1427 Oral     SpO2 11/03/20 1427 100 %     Weight --      Height --      Head Circumference --      Peak Flow --      Pain Score 11/03/20 1425 3     Pain Loc --      Pain Edu? --      Excl. in [place]? --    No data found.  Updated Vital Signs BP 118/73 (BP Location: Right Arm)   Pulse 100   Temp 99.3 F (37.4 C) (Oral)   Resp 16   LMP  (Within Weeks) Comment: 2 weeks  SpO2 100%   Visual Acuity Right Eye Distance:   Left Eye Distance:   Bilateral Distance:    Right Eye Near:   Left Eye Near:    Bilateral Near:     Physical Exam Vitals and nursing note reviewed.  Constitutional:      General: She is not in acute distress.    Appearance: Normal appearance. She is not ill-appearing, toxic-appearing or diaphoretic.  HENT:     Head: Normocephalic and atraumatic.  Eyes:     Conjunctiva/sclera: Conjunctivae normal.  Cardiovascular:     Rate and Rhythm: Normal rate.     Pulses: Normal pulses.  Pulmonary:     Effort: Pulmonary effort is normal.  Abdominal:     General: Abdomen is flat.  Genitourinary:    Comments: declines Musculoskeletal:        General: Normal range of motion.     Cervical back: Normal range of motion.  Skin:    General: Skin is warm and dry.  Neurological:     General: No focal deficit present.     Mental Status: She is alert and oriented to person, place, and time.  Psychiatric:        Mood and Affect: Mood normal.     UC Treatments / Results  Labs (all labs ordered are listed, but only abnormal results are  displayed) Labs Reviewed  POCT URINALYSIS DIPSTICK, ED / UC - Abnormal; Notable for the following components:      Result Value   Bilirubin Urine SMALL (*)    Ketones, ur TRACE (*)    Hgb urine dipstick MODERATE (*)    All other components within normal limits  POC URINE PREG, ED  CERVICOVAGINAL ANCILLARY ONLY    EKG   Radiology No results found.  Procedures Procedures (including critical care time)  Medications Ordered in UC Medications - No data to display  Initial Impression / Assessment and Plan / UC Course  I have reviewed the triage vital signs and the nursing notes.  Pertinent labs & imaging results that were available during my care of the patient were reviewed by me and considered in my medical decision making (see chart for details).    Assessment negative for red flags or concerns.  Urine pregnancy test negative and urinalysis negative for signs of infection.  This is likely yeast vaginitis and will treat with Diflucan 1 pill today and repeat in 3 days if still having symptoms.  Self swab obtained.  Encourage fluids.  Primary care as needed. Final Clinical Impressions(s) / UC Diagnoses   Final diagnoses:  Yeast vaginitis     Discharge Instructions      Take the Diflucan one pill today and the second pill in 3 days if you are are still having symptoms.   Make sure you are  drinking plenty of fluids.    We will contact you with the results from your lab work and any additional treatment.     Return or go to the Emergency Department if symptoms worsen or do not improve in the next few days.      ED Prescriptions     Medication Sig Dispense Auth. Provider   fluconazole (DIFLUCAN) 150 MG tablet Take one pill today.  Take the second pill in 3 days if you are are still having symptoms. 2 tablet Pearson Forster, NP      PDMP not reviewed this encounter.   Pearson Forster, NP 11/03/20 [phone]

## 2020-11-03 NOTE — ED Triage Notes (Signed)
Pt reports white vaginal discharge, vaginal irritation, left lower quadrant abdominal pain x 3-4 days.

## 2020-11-04 LAB — CERVICOVAGINAL ANCILLARY ONLY
Bacterial Vaginitis (gardnerella): NEGATIVE
Candida Glabrata: NEGATIVE
Candida Vaginitis: POSITIVE — AB
Chlamydia: NEGATIVE
Comment: NEGATIVE
Comment: NEGATIVE
Comment: NEGATIVE
Comment: NEGATIVE
Comment: NEGATIVE
Comment: NORMAL
Neisseria Gonorrhea: NEGATIVE
Trichomonas: NEGATIVE

## 2020-11-05 DIAGNOSIS — Z0279 Encounter for issue of other medical certificate: Secondary | ICD-10-CM

## 2020-11-05 NOTE — Telephone Encounter (Signed)
The patient still hasn't received a response back. She stated that if she doesn't hear anything by 3 that she is coming to the office.

## 2020-11-05 NOTE — Telephone Encounter (Signed)
Patient is aware 

## 2020-11-11 ENCOUNTER — Encounter: Payer: Self-pay | Admitting: Physical Medicine & Rehabilitation

## 2020-11-16 NOTE — Telephone Encounter (Signed)
Paper work faxed on 7/1.

## 2020-11-18 DIAGNOSIS — Z0279 Encounter for issue of other medical certificate: Secondary | ICD-10-CM

## 2020-11-21 ENCOUNTER — Emergency Department (HOSPITAL_COMMUNITY)
Admission: EM | Admit: 2020-11-21 | Discharge: 2020-11-21 | Disposition: A | Discharge status: Home / Self Care | Payer: Medicaid Other | Attending: Emergency Medicine | Admitting: Emergency Medicine

## 2020-11-21 ENCOUNTER — Encounter (HOSPITAL_COMMUNITY): Payer: Self-pay | Admitting: Emergency Medicine

## 2020-11-21 ENCOUNTER — Emergency Department (HOSPITAL_COMMUNITY): Payer: Medicaid Other

## 2020-11-21 ENCOUNTER — Other Ambulatory Visit: Payer: Self-pay

## 2020-11-21 DIAGNOSIS — K029 Dental caries, unspecified: Secondary | ICD-10-CM | POA: Insufficient documentation

## 2020-11-21 DIAGNOSIS — K0889 Other specified disorders of teeth and supporting structures: Secondary | ICD-10-CM | POA: Diagnosis present

## 2020-11-21 DIAGNOSIS — K044 Acute apical periodontitis of pulpal origin: Secondary | ICD-10-CM | POA: Diagnosis not present

## 2020-11-21 DIAGNOSIS — K045 Chronic apical periodontitis: Secondary | ICD-10-CM

## 2020-11-21 MED ORDER — LIDOCAINE VISCOUS HCL 2 % MT SOLN: 15.0000 mL | OROMUCOSAL | 0 refills | Status: DC | PRN

## 2020-11-21 MED ORDER — KETOROLAC TROMETHAMINE 60 MG/2ML IM SOLN
30.0000 mg | Freq: Once | INTRAMUSCULAR | Status: AC
  Administered 2020-11-21: 30 mg via INTRAMUSCULAR
  Filled 2020-11-21: qty 2

## 2020-11-21 MED ORDER — AMOXICILLIN 500 MG PO CAPS: 500.0000 mg | ORAL_CAPSULE | Freq: Three times a day (TID) | ORAL | 0 refills | Status: AC

## 2020-11-21 MED ORDER — AMOXICILLIN 500 MG PO CAPS
500.0000 mg | ORAL_CAPSULE | Freq: Once | ORAL | Status: AC
  Administered 2020-11-21: 500 mg via ORAL
  Filled 2020-11-21: qty 1

## 2020-11-21 MED ORDER — NAPROXEN 500 MG PO TABS: 500.0000 mg | ORAL_TABLET | Freq: Two times a day (BID) | ORAL | 0 refills | Status: DC

## 2020-11-21 MED ORDER — IBUPROFEN 600 MG PO TABS: 600.0000 mg | ORAL_TABLET | Freq: Four times a day (QID) | ORAL | 0 refills | Status: AC | PRN

## 2020-11-21 NOTE — Discharge Instructions (Addendum)
Thank you for allowing me to care for you today in the Emergency Department.   Please follow-up with a dentist.  I have included the name of the on-call dentist for the ER above.  I have also attached a dental resource guide of many of the dental clinics in the area.  Take 1 tablet of amoxicillin every 8 hours for the next 5 days.  Your first dose was given in the emergency department.  Take Naprosyn as prescribed for pain.  You can swallow 15 mL of viscous lidocaine every 3 hours as needed for pain.  Return to the emergency department if you become unable to open your mouth, develop drooling, develop severe swelling to the face or neck, or other new, concerning symptoms.

## 2020-11-21 NOTE — ED Notes (Signed)
Right upper molar pain for 1 week.

## 2020-11-21 NOTE — ED Provider Notes (Signed)
[facility] EMERGENCY DEPARTMENT Provider Note   CSN: [id number] Arrival date & time: 11/21/20  1308     History Chief Complaint  Patient presents with   Dental Pain    Caitlyn Roy is a 35 y.o. 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  The patient reports that she has been having facial pain, near the right upper mouth for the last week.  She was seen by her dentist who referred her to an endodontist.  She went to the endodontist who took x-rays, but did not think that she needed a root canal.  She reports that she sent the x-rays off to have them further evaluated, but she has not been given any further follow-up at this time.  She was not advised to follow back up with her dentist.  She has been taking Tylenol for the pain, but it has been constant, worsening since onset.  She is now having a right-sided headache and pain in the entire right side of her face.  She does have a On one of her right upper molars, and did notice increased pain when she was at her dentist office and he did manipulate this area with a, but otherwise does not know any other aggravating or alleviating factors.  She has a history of chronic nasal congestion.  On the ride to the emergency department, she had some blurriness in her right eye, but she reports that this has resolved as she has had no other eye complaints including eye pain, redness, amaurosis fugax, diplopia.  She also has also noticed that her right ear has been popping more.  She denies fever, chills, sore throat, ear pain, otorrhea, neck pain, neck stiffness, rhinorrhea.  No recent dental injury.  The history is provided by the patient and medical records. No language interpreter was used.      Past Medical History:  Diagnosis Date   ADHD    Anemia    Anginal pain  ([place])    being followed by cardiologists   Anxiety    Depression    Dysrhythmia    palpitation    Fibroid    H/O cesarean section 2010   History of blood transfusion    Nausea    d/t to "issues with gallballader"   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; [facility]   Tubal pregnancy    Vaginal odor    Vaginitis     Patient Active Problem List   Diagnosis Date Noted   Upper back pain 11/02/2020   Abnormal uterine bleeding 07/02/2020   Anemia 07/02/2020   Disorder of patellofemoral joint 07/02/2020   Other specified abnormal immunological findings in serum 07/02/2020   Fatigue 06/02/2020   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   Concentration deficit 03/24/2020   Ectopic pregnancy 08/07/2018   S/P repeat low transverse C-section 04/06/2018   H/O: cesarean section 04/06/2018   Goiter 08/16/2017   Acute appendicitis 10/20/2016   Iron deficiency anemia 03/03/2016   Palpitations 03/03/2016   BV (bacterial vaginosis) 10/15/2014    Past Surgical History:  Procedure Laterality Date   BIOPSY EYE MUSCLE     CESAREAN SECTION  2010   CESAREAN SECTION N/A 04/06/2018   Procedure: CESAREAN SECTION bladder irrigation;  Surgeon: [name], MD;  Location: [facility];  Service: Obstetrics;  Laterality: N/A;  needs RNFA   CHOLECYSTECTOMY N/A 08/14/2019   Procedure: LAPAROSCOPIC CHOLECYSTECTOMY;  Surgeon: Greer Pickerel, MD;  Location: [facility];  Service: General;  Laterality: N/A;   DIAGNOSTIC LAPAROSCOPY WITH REMOVAL OF ECTOPIC PREGNANCY N/A 08/07/2018   Procedure: DIAGNOSTIC LAPAROSCOPY WITH REMOVAL OF ECTOPIC PREGNANCY;  Surgeon: Janyth Pupa, DO;  Location: [facility];  Service: Gynecology;  Laterality: N/A;   LAPAROSCOPIC APPENDECTOMY N/A 10/20/2016   Procedure: APPENDECTOMY LAPAROSCOPIC;  Surgeon: Judeth Horn, MD;  Location: [facility];  Service: General;  Laterality: N/A;   LAPAROSCOPIC  SALPINGO OOPHERECTOMY Left 08/07/2018   Procedure: LAPAROSCOPIC SALPINGECTOMY LEFT ;  Surgeon: Janyth Pupa, DO;  Location: [facility];  Service: Gynecology;  Laterality: Left;     OB History     Gravida  9   Para  2   Term  2   Preterm      AB  6   Living  2      SAB  4   IAB  1   Ectopic  1   Multiple  0   Live Births  2           Family History  Problem Relation Age of Onset   Hypertension Mother    Breast cancer Mother    Heart attack Mother    Seizures Mother    Alcohol abuse Father    Kidney disease Maternal Grandmother    Diabetes Paternal Grandmother     Social History   Tobacco Use   Smoking status: Never   Smokeless tobacco: Never  Vaping Use   Vaping Use: Never used  Substance Use Topics   Alcohol use: Yes    Comment: Socially   Drug use: No    Home Medications Prior to Admission medications   Medication Sig Start Date End Date Taking? Authorizing Provider  amoxicillin (AMOXIL) 500 MG capsule Take 1 capsule (500 mg total) by mouth 3 (three) times daily for 5 days. 11/21/20 11/26/20 Yes Valeta Paz A, PA-C  ibuprofen (ADVIL) 600 MG tablet Take 1 tablet (600 mg total) by mouth every 6 (six) hours as needed. 11/21/20  Yes Maydelin Deming A, PA-C  lidocaine (XYLOCAINE) 2 % solution Use as directed 15 mLs in the mouth or throat every 3 (three) hours as needed for mouth pain. 11/21/20  Yes Lin Hackmann A, PA-C  albuterol (VENTOLIN HFA) 108 (90 Base) MCG/ACT inhaler Inhale 1-2 puffs into the lungs every 6 (six) hours as needed for wheezing or shortness of breath. 10/14/20   Billie Ruddy, MD  ferrous sulfate 300 (60 Fe) MG/5ML syrup Take 5 mLs (300 mg total) by mouth daily with breakfast. 08/18/20   Billie Ruddy, MD  fluconazole (DIFLUCAN) 150 MG tablet Take one pill today.  Take the second pill in 3 days if you are are still having symptoms. 11/03/20   Pearson Forster, NP  sertraline (ZOLOFT) 25 MG tablet Take one tab (25 mg) daily x 1 wk.  Can  increase to 2 tabs daily (50 mg) after one week. Patient not taking: Reported on 08/08/2020 05/05/20 08/12/20  Billie Ruddy, MD    Allergies    Penicillins and Compazine [prochlorperazine]  Review of Systems   Review of Systems  Constitutional:  Negative for activity change, chills, fatigue and fever.  HENT:  Positive for ear pain and facial swelling. Negative for ear discharge, hearing loss, mouth sores, rhinorrhea, sinus pressure, sinus pain, sore throat,  tinnitus and voice change.   Eyes:  Positive for visual disturbance. Negative for photophobia.  Respiratory:  Negative for shortness of breath.   Cardiovascular:  Negative for chest pain.  Gastrointestinal:  Negative for abdominal pain, constipation, diarrhea, nausea and vomiting.  Genitourinary:  Negative for dysuria.  Musculoskeletal:  Negative for back pain and myalgias.  Skin:  Negative for rash.  Allergic/Immunologic: Negative for immunocompromised state.  Neurological:  Positive for headaches. Negative for dizziness, seizures, syncope, weakness, light-headedness and numbness.  Psychiatric/Behavioral:  Negative for confusion.    Physical Exam Updated Vital Signs BP 117/86 (BP Location: Right Arm)   Pulse 94   Temp 98.2 F (36.8 C) (Oral)   Resp 16   Ht 5' 2.5" (1.588 m)   Wt 69.9 kg   LMP 10/25/2020   SpO2 100%   BMI 27.72 kg/m   Physical Exam Vitals and nursing note reviewed.  Constitutional:      General: She is not in acute distress. HENT:     Head: Normocephalic.     Right Ear: Tympanic membrane, ear canal and external ear normal. There is no impacted cerumen.     Left Ear: Tympanic membrane, ear canal and external ear normal. There is no impacted cerumen.     Nose: Nose normal. No congestion or rhinorrhea.      Comments: No frontal or maxillary sinus tenderness to palpation bilaterally    Mouth/Throat:     Mouth: No injury or oral lesions.     Dentition: Abnormal dentition. Does not have dentures. Dental  caries present. No dental tenderness, gingival swelling, dental abscesses or gum lesions.     Comments: No focal tenderness with individual palpation of the right maxillary teeth.  Eyes:     Conjunctiva/sclera: Conjunctivae normal.  Cardiovascular:     Rate and Rhythm: Normal rate and regular rhythm.     Heart sounds: No murmur heard.   No friction rub. No gallop.  Pulmonary:     Effort: Pulmonary effort is normal. No respiratory distress.     Breath sounds: No stridor. No wheezing, rhonchi or rales.  Chest:     Chest wall: No tenderness.  Abdominal:     General: There is no distension.     Palpations: Abdomen is soft. There is no mass.     Tenderness: There is no abdominal tenderness. There is no right CVA tenderness, left CVA tenderness, guarding or rebound.  Musculoskeletal:     Cervical back: Neck supple.  Skin:    General: Skin is warm.     Findings: No rash.  Neurological:     Mental Status: She is alert.  Psychiatric:        Behavior: Behavior normal.    ED Results / Procedures / Treatments   Labs (all labs ordered are listed, but only abnormal results are displayed) Labs Reviewed - No data to display  EKG None  Radiology CT Maxillofacial Wo Contrast  Result Date: 11/21/2020 CLINICAL DATA:  35 year old female with right side dental pain. EXAM: CT MAXILLOFACIAL WITHOUT CONTRAST TECHNIQUE: Multidetector CT imaging of the maxillofacial structures was performed. Multiplanar CT image reconstructions were also generated. COMPARISON:  Brain MRI 05/28/2020 and earlier. FINDINGS: Osseous: Mandible intact.  Normal TMJ alignment. No acute mandible dentition finding. Maxillary dentition with posterior right maxillary molar periapical cyst and small focus of dehiscence at both the medial alveolar process abutting the hard palate (series 5, image 40) and also into the alveolar recess of the right maxillary sinus (coronal  image 41 and sagittal series 7, image 27. Anterior to that mild  periapical lucency at the right maxillary posterior bicuspid. Contralateral previous left maxillary anterior molar root canal changes slightly extending into the alveolar recess but no other adverse features. Other facial bones appear intact. Central skull base intact. Visible cervical vertebrae intact. Visible calvarium intact. Orbits: Intact orbital walls. Orbits soft tissues appears symmetric and within normal limits. Sinuses: Mild to moderate right maxillary sinus mucosal thickening most pronounced at the alveolar recess. Other paranasal sinuses and mastoids are largely clear. There is mild mucosal thickening in the left frontal sinus and at the frontoethmoidal recess. Tympanic cavities are clear. Soft tissues: Negative visible noncontrast thyroid, larynx, pharynx, parapharyngeal spaces, retropharyngeal space, sublingual space, submandibular glands, bilateral masticator spaces, parotid glands. Limited intracranial: Negative. IMPRESSION: 1. Posterior right maxillary molar inflammatory periapical lucency with dehiscence at both the medial alveolar process abutting the hard palate, and into the alveolar recess of the right maxillary sinus. See series 5 images 40 and 41. Mild associated odontogenic right maxillary sinusitis. 2. Otherwise negative noncontrast Face CT. Electronically Signed   By: Genevie Ann M.D.   On: 11/21/2020 06:05    Procedures Procedures   Medications Ordered in ED Medications  ketorolac (TORADOL) injection 30 mg (30 mg Intramuscular Given 11/21/20 0446)  amoxicillin (AMOXIL) capsule 500 mg (500 mg Oral Given 11/21/20 3295)    ED Course  I have reviewed the triage vital signs and the nursing notes.  Pertinent labs & imaging results that were available during my care of the patient were reviewed by me and considered in my medical decision making (see chart for details).    MDM Rules/Calculators/A&P                          35 year old female with a history of orbital pseudotumor  cerebri s/p steroids and radiation therapy, dysrhythmia, ADHD, anginal pain who presents the emergency department with facial pain for the last week.  Patient initially thought pain was dental related went to her dentist who referred her to an endodontist.  Endodontist stated that she did not need a root canal at that time.  Now she is having focal pain in the right side of her face that is causing a right-sided headache.  No constitutional symptoms.  No recent oral injuries.  Vital signs are stable.  The patient was discussed with Dr. Wyvonnia Dusky, attending physician.  She does not have any focal tenderness to palpation to the teeth on her exam.  She is having more maxillary tenderness on her face.  She did report some blurry vision on her ride to the ER, but this is now resolved.  She did also have a remote history of orbital pseudotumor cerebri previously.  Imaging has been obtained and independently reviewed by me. No CT with posterior right maxillary molar inflammatory periapical lucency. Doubt ludwig's angina, peritonsillar abscess,osteomyelitis. Will start on amoxicillin.  First dose given in the ED. we will have the patient follow back up with dentistry.  ER return precautions given.  She is hemodynamically stable in no acute distress.  Safer discharge home with outpatient follow-up as discussed.  Final Clinical Impression(s) / ED Diagnoses Final diagnoses:  Periapical periodontitis    Rx / DC Orders ED Discharge Orders          Ordered    naproxen (NAPROSYN) 500 MG tablet  2 times daily,   Status:  Discontinued  11/21/20 0643    amoxicillin (AMOXIL) 500 MG capsule  3 times daily        11/21/20 0643    lidocaine (XYLOCAINE) 2 % solution  Every  3 hours PRN        11/21/20 0646    ibuprofen (ADVIL) 600 MG tablet  Every 6 hours PRN        11/21/20 0657             Joanne Gavel, PA-C 11/21/20 7341    Ezequiel Essex, MD 11/21/20 [phone]

## 2020-11-21 NOTE — ED Triage Notes (Signed)
Pt reports right sided dental pain that continues to increase.  She went to her dentist who sent her to a specialist.

## 2020-11-24 ENCOUNTER — Ambulatory Visit (HOSPITAL_COMMUNITY)
Admission: EM | Admit: 2020-11-24 | Discharge: 2020-11-24 | Discharge status: Against Medical Advice | Payer: Medicaid Other

## 2020-11-24 ENCOUNTER — Other Ambulatory Visit: Payer: Self-pay

## 2020-12-06 ENCOUNTER — Other Ambulatory Visit: Payer: Self-pay

## 2020-12-07 ENCOUNTER — Ambulatory Visit (INDEPENDENT_AMBULATORY_CARE_PROVIDER_SITE_OTHER): Payer: 59 | Admitting: Family Medicine

## 2020-12-07 ENCOUNTER — Encounter: Payer: Self-pay | Admitting: Family Medicine

## 2020-12-07 VITALS — BP 102/70 | HR 85 | Temp 99.3°F | Wt 151.0 lb

## 2020-12-07 DIAGNOSIS — R739 Hyperglycemia, unspecified: Secondary | ICD-10-CM | POA: Diagnosis not present

## 2020-12-07 DIAGNOSIS — R42 Dizziness and giddiness: Secondary | ICD-10-CM | POA: Diagnosis not present

## 2020-12-07 DIAGNOSIS — F418 Other specified anxiety disorders: Secondary | ICD-10-CM | POA: Diagnosis not present

## 2020-12-07 MED ORDER — BUPROPION HCL ER (XL) 150 MG PO TB24: 150.0000 mg | ORAL_TABLET | Freq: Every day | ORAL | 0 refills | Status: DC

## 2020-12-07 NOTE — Progress Notes (Signed)
   Subjective:    Patient ID: Caitlyn Roy, female    DOB: 29-Apr-1986, 35 y.o.   MRN: [id number]  HPI Here for several issues. First she wants to have her glucose checked because she has had some spells of weakness, lightheadedness, and shakiness in the past week. No dizziness as such. Often if she eats something during these spells, she then feels better. She has a strong family hx of diabetes. Also she has been dealing with depression and anxiety recently. She tried Zoloft 25 mg several years ago, but she had to stop it because it made her "feel bad". Her appetite and sleep are intact. She has feelings of sadness and hopelessness at times. No suicidal thoughts. Her anxiety has not been as much of a problem lately. Her A1c today is 5.2.    Review of Systems  Constitutional: Negative.   Respiratory: Negative.    Cardiovascular: Negative.   Neurological:  Positive for light-headedness. Negative for dizziness and headaches.  Psychiatric/Behavioral:  Positive for decreased concentration and dysphoric mood. Negative for agitation, behavioral problems, confusion, hallucinations, self-injury, sleep disturbance and suicidal ideas. The patient is nervous/anxious.       Objective:   Physical Exam Constitutional:      Appearance: Normal appearance. She is not ill-appearing.  Cardiovascular:     Rate and Rhythm: Normal rate and regular rhythm.     Pulses: Normal pulses.     Heart sounds: Normal heart sounds.  Pulmonary:     Effort: Pulmonary effort is normal.     Breath sounds: Normal breath sounds.  Neurological:     General: No focal deficit present.     Mental Status: She is alert and oriented to person, place, and time.  Psychiatric:        Mood and Affect: Mood normal.        Behavior: Behavior normal.        Thought Content: Thought content normal.          Assessment & Plan:  It sounds like she is having hypoglycemic episodes. I advised her to eat 3 meals a day and to snack  in between meals. Also for the depression, she will try Wellbutrin XL 150 mg daily. Follow up with Dr. Volanda Napoleon, her PCP, in 3-4 weeks. We spent 35 minutes reviewing records and discussing these issues.  Alysia Penna, MD  .me

## 2020-12-15 ENCOUNTER — Other Ambulatory Visit: Payer: Self-pay

## 2020-12-16 ENCOUNTER — Ambulatory Visit: Payer: Medicaid Other | Admitting: Family Medicine

## 2021-01-12 ENCOUNTER — Other Ambulatory Visit: Payer: Self-pay | Admitting: Family Medicine

## 2021-01-13 NOTE — Telephone Encounter (Signed)
Dr.Banks pt 

## 2021-01-14 NOTE — Telephone Encounter (Signed)
Needs follow up appointment.  

## 2021-01-18 ENCOUNTER — Encounter: Payer: Medicaid Other | Attending: Physical Medicine & Rehabilitation | Admitting: Physical Medicine & Rehabilitation

## 2021-03-15 ENCOUNTER — Other Ambulatory Visit: Payer: Self-pay | Admitting: Family Medicine

## 2021-03-15 DIAGNOSIS — D509 Iron deficiency anemia, unspecified: Secondary | ICD-10-CM

## 2021-03-16 ENCOUNTER — Other Ambulatory Visit: Payer: Self-pay | Admitting: Family Medicine

## 2021-03-16 DIAGNOSIS — D509 Iron deficiency anemia, unspecified: Secondary | ICD-10-CM

## 2021-06-22 ENCOUNTER — Telehealth: Payer: Self-pay | Admitting: Family Medicine

## 2021-06-22 ENCOUNTER — Encounter: Payer: Self-pay | Admitting: Family Medicine

## 2021-06-22 NOTE — Telephone Encounter (Signed)
Pt called back and will upload the form to her mychart

## 2021-06-22 NOTE — Telephone Encounter (Signed)
Patient called in to let Dr.Banks know that she uploaded the documents.

## 2021-06-22 NOTE — Telephone Encounter (Signed)
Pt last seen June 2022 and will fax over covid 19 accomodation request from . Pt has positive ANA

## 2021-06-22 NOTE — Telephone Encounter (Signed)
Noted  

## 2021-06-23 NOTE — Telephone Encounter (Signed)
Patient called in stating that if Dr.Banks need help with what to put she should call her.

## 2021-06-27 NOTE — Telephone Encounter (Signed)
Pt last seen June 2022.  Pt is followed by Rheumatology for h/o abnormal ANA.  Would have Rheumatology complete accommodation form as they are following her for this condition.

## 2021-06-30 ENCOUNTER — Telehealth: Payer: Self-pay | Admitting: Internal Medicine

## 2021-06-30 NOTE — Telephone Encounter (Signed)
Patient returned call to the office. Patient advised Dr. Benjamine Mola doesn't recall discussing any specific reason why she cannot have required work vaccinations. Our workup here and by Dr. Audelia Hives reviewed records did not show any specific evidence for lupus associated with the positive ANA lab. Patient advised we would not be able to write an exemption letter. Patient expressed understanding.

## 2021-06-30 NOTE — Telephone Encounter (Signed)
I don't recall discussing any specific reason why she cannot have required work vaccinations. Our workup here and by Dr. Audelia Hives reviewed records did not show any specific evidence for lupus associated with the positive ANA lab.

## 2021-06-30 NOTE — Telephone Encounter (Signed)
Spoke with patient, she stated that she spoke with Rheumatology and they will not fill out the form, stating "they do not see any significant reason why it would affect her".   Please advise

## 2021-06-30 NOTE — Telephone Encounter (Signed)
Would have to yield to rheumatology's judgment on this matter.

## 2021-06-30 NOTE — Telephone Encounter (Signed)
Attempted to contact the patient, unable to leave message. Recording comes on stating " We are sorry your call can not be completed at this time. Please hang up and try call again later."

## 2021-06-30 NOTE — Telephone Encounter (Signed)
Patient called the office stating she would like an exemption letter to not get Covid-19 vaccine for work since she immunocompromised. Patient states if she needs to come into the office she is out of the [place] currently and cannot come in. Patient states she needs the letter today by 12.

## 2022-06-29 IMAGING — DX DG CHEST 1V PORT
1 series · 1 of 1 positions shown · non-contrast
Comparison: 10/28/2018

CLINICAL DATA: Chest pain and fever, initial encounter

EXAM:
PORTABLE CHEST 1 VIEW

[chest]
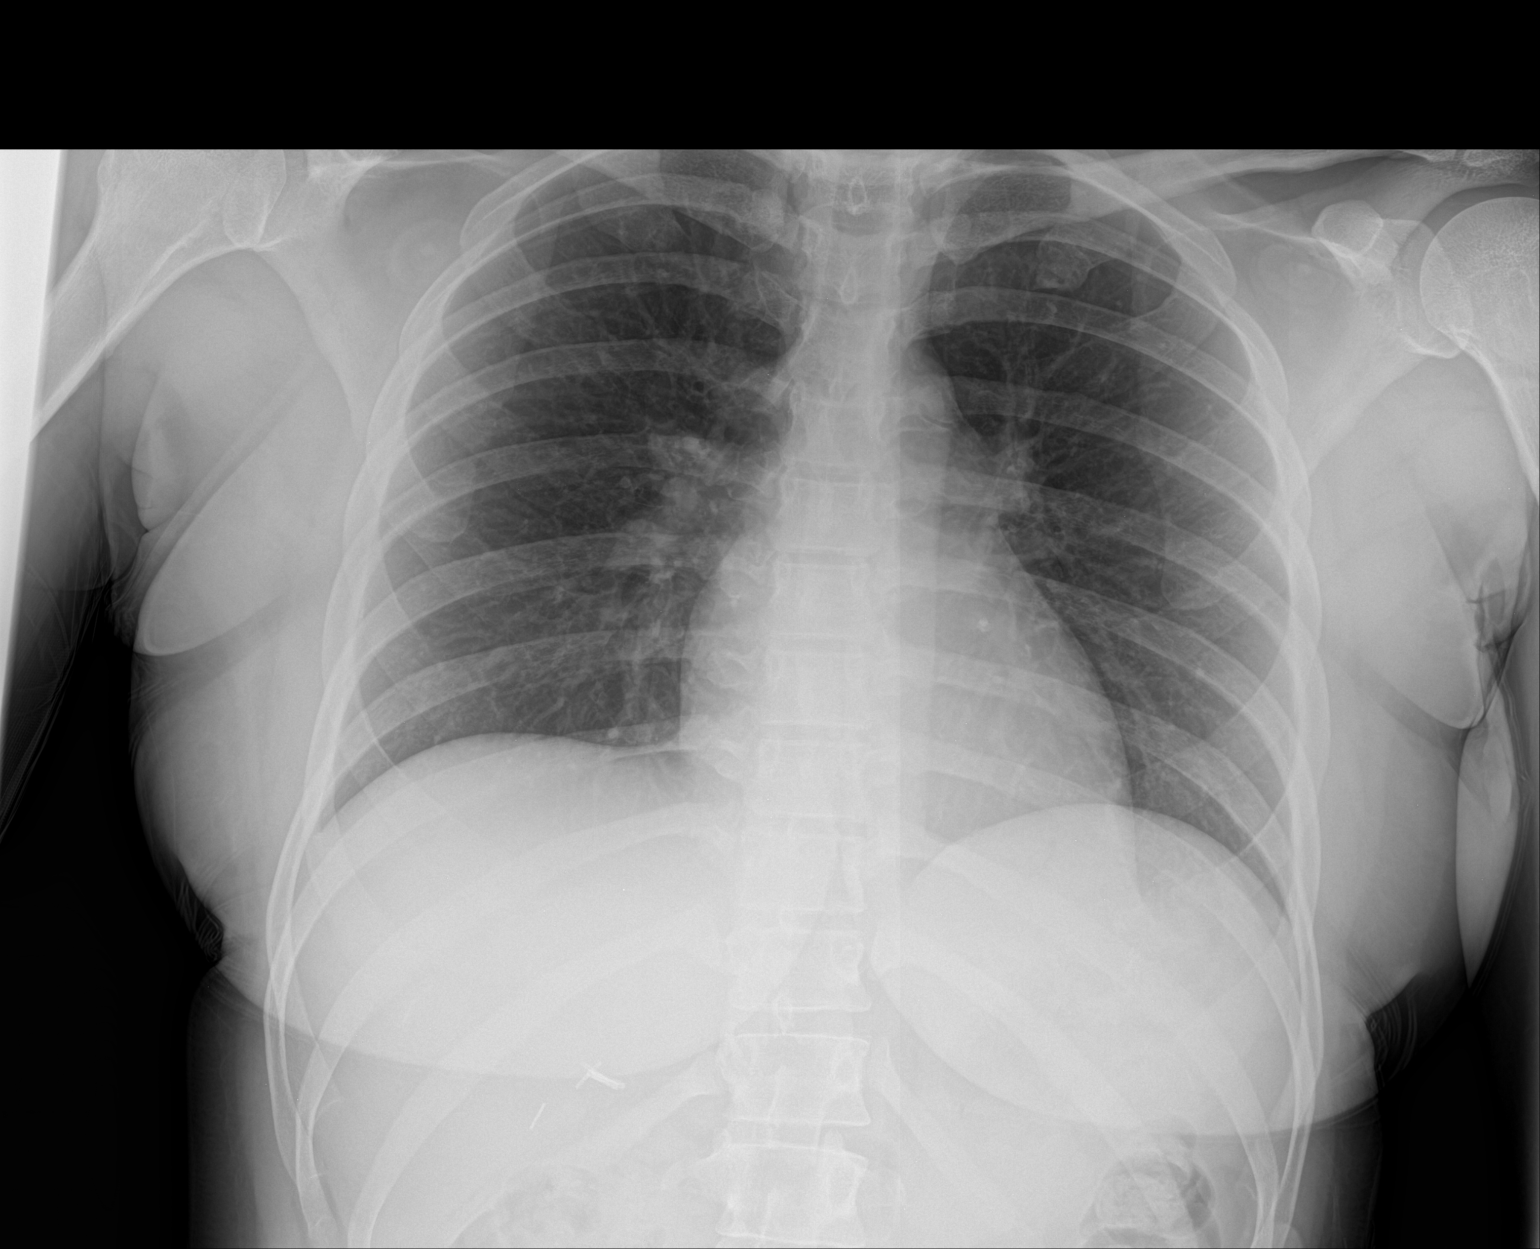

[1 of 1 positions shown; findings below may reference images not displayed]

FINDINGS: The heart size and mediastinal contours are within normal limits.
Both lungs are clear. The visualized skeletal structures are
unremarkable.
IMPRESSION: No active disease.

## 2022-07-02 IMAGING — DX DG CHEST 1V PORT
1 series · 1 of 1 positions shown · non-contrast
Comparison: 09/15/2020 and earlier, including CTA chest 08/03/2016.

CLINICAL DATA: 35-year-old patient who is positive for [id number],
presenting with shortness of breath, hypoxia, chest tightness, and
cough.

EXAM:
PORTABLE CHEST 1 VIEW

[chest]
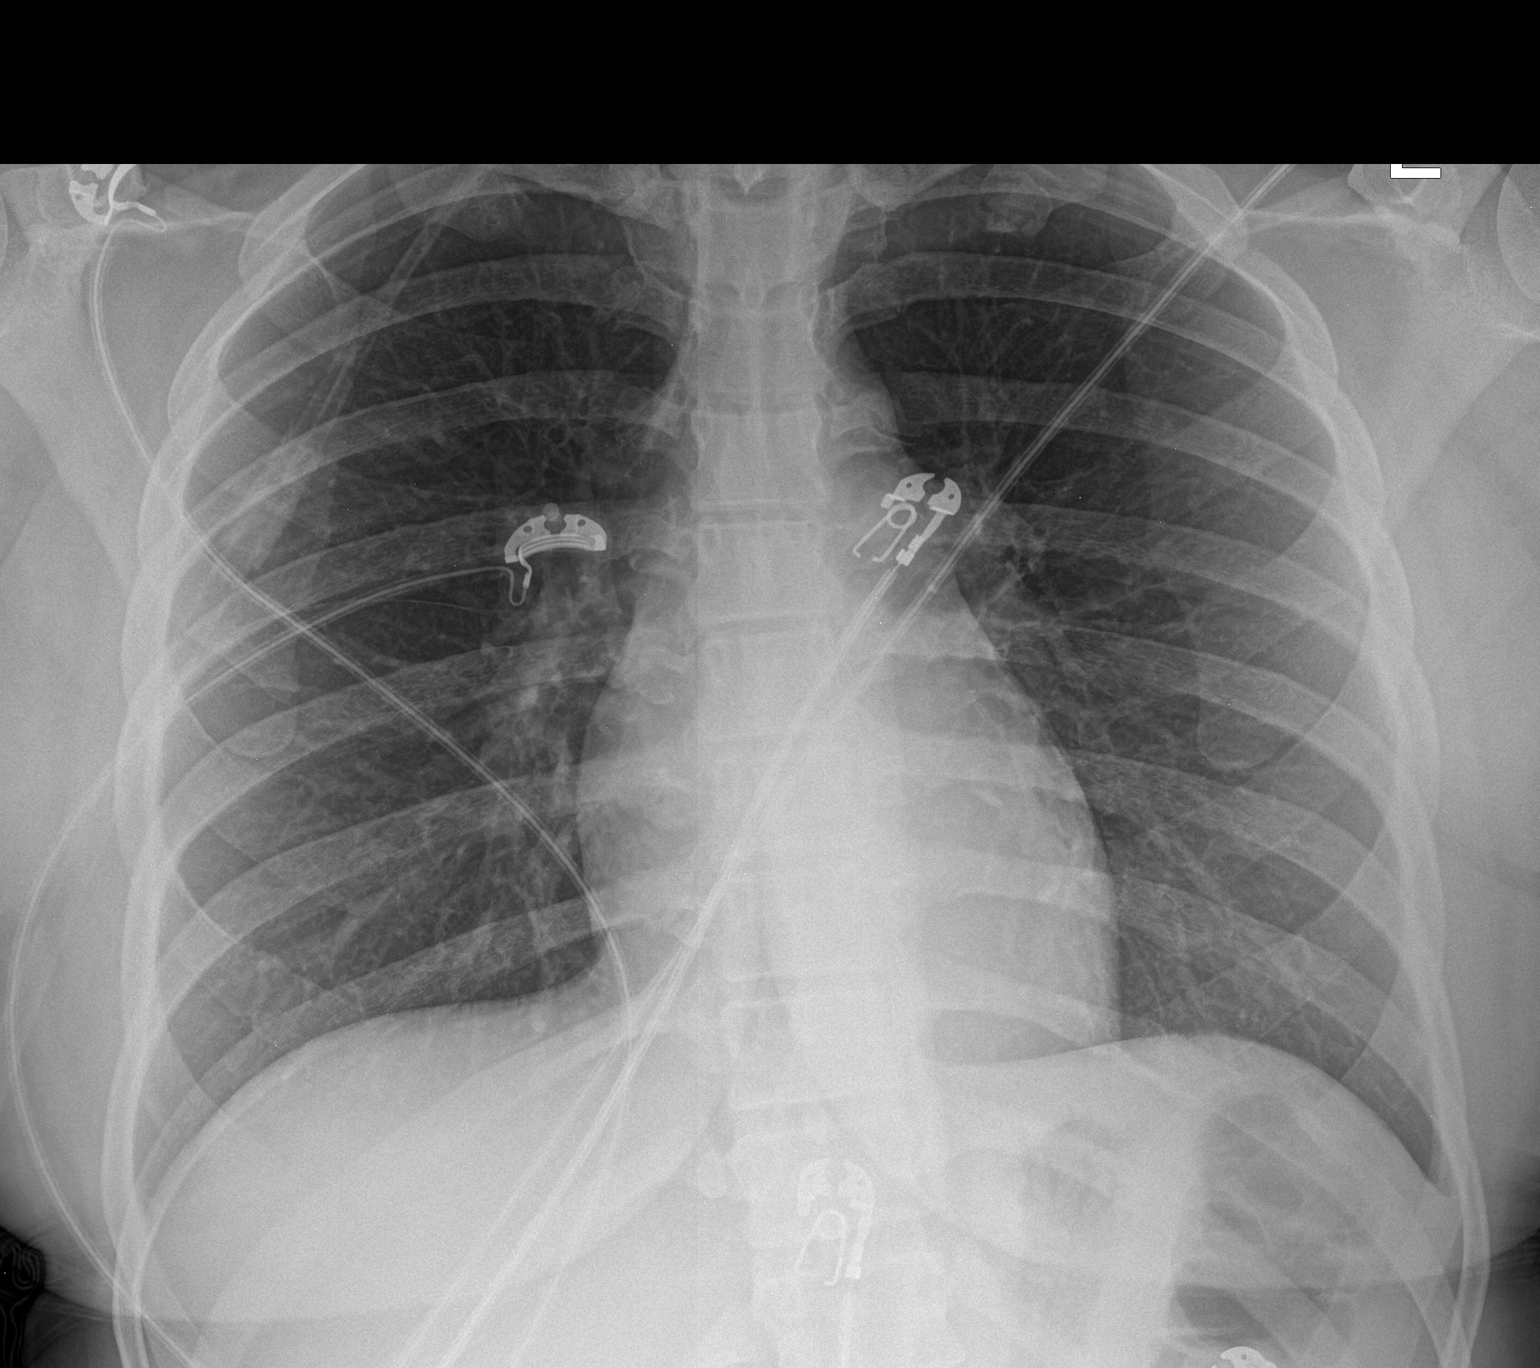

[1 of 1 positions shown; findings below may reference images not displayed]

FINDINGS: Cardiac silhouette and mediastinal contours normal in appearance for
the AP portable technique. Pulmonary parenchyma clear.
Bronchovascular markings normal. Pulmonary vascularity normal. No
pneumothorax. No visible pleural effusions.
IMPRESSION: No acute cardiopulmonary disease.
